# Patient Record
Sex: Female | Born: 1975
Health system: Southern US, Community
[De-identification: ages and names within clinical notes are randomized; demographics above are authoritative.]

## PROBLEM LIST (undated history)

## (undated) DIAGNOSIS — N83209 Unspecified ovarian cyst, unspecified side: Secondary | ICD-10-CM

## (undated) DIAGNOSIS — D649 Anemia, unspecified: Secondary | ICD-10-CM

## (undated) DIAGNOSIS — L853 Xerosis cutis: Secondary | ICD-10-CM

## (undated) DIAGNOSIS — J302 Other seasonal allergic rhinitis: Secondary | ICD-10-CM

## (undated) DIAGNOSIS — R5383 Other fatigue: Secondary | ICD-10-CM

## (undated) DIAGNOSIS — E559 Vitamin D deficiency, unspecified: Secondary | ICD-10-CM

## (undated) DIAGNOSIS — M7989 Other specified soft tissue disorders: Secondary | ICD-10-CM

## (undated) DIAGNOSIS — G479 Sleep disorder, unspecified: Secondary | ICD-10-CM

## (undated) HISTORY — DX: Sleep disorder, unspecified: G47.9

## (undated) HISTORY — DX: Other seasonal allergic rhinitis: J30.2

## (undated) HISTORY — DX: Unspecified ovarian cyst, unspecified side: N83.209

## (undated) HISTORY — DX: Vitamin D deficiency, unspecified: E55.9

## (undated) HISTORY — DX: Other fatigue: R53.83

## (undated) HISTORY — DX: Xerosis cutis: L85.3

## (undated) HISTORY — DX: Other specified soft tissue disorders: M79.89

---

## 1999-09-19 ENCOUNTER — Other Ambulatory Visit: Admission: RE | Admit: 1999-09-19 | Discharge: 1999-09-19 | Payer: Self-pay | Admitting: Gynecology

## 2000-08-12 ENCOUNTER — Inpatient Hospital Stay (HOSPITAL_COMMUNITY): Admission: AD | Admit: 2000-08-12 | Discharge: 2000-08-14 | Payer: Self-pay | Admitting: *Deleted

## 2000-09-23 ENCOUNTER — Other Ambulatory Visit: Admission: RE | Admit: 2000-09-23 | Discharge: 2000-09-23 | Payer: Self-pay | Admitting: Gynecology

## 2001-10-07 ENCOUNTER — Other Ambulatory Visit: Admission: RE | Admit: 2001-10-07 | Discharge: 2001-10-07 | Payer: Self-pay | Admitting: Gynecology

## 2003-01-20 ENCOUNTER — Other Ambulatory Visit: Admission: RE | Admit: 2003-01-20 | Discharge: 2003-01-20 | Payer: Self-pay | Admitting: Obstetrics & Gynecology

## 2003-04-07 ENCOUNTER — Encounter: Admission: RE | Admit: 2003-04-07 | Discharge: 2003-05-18 | Payer: Self-pay | Admitting: Orthopedic Surgery

## 2004-05-16 ENCOUNTER — Other Ambulatory Visit: Admission: RE | Admit: 2004-05-16 | Discharge: 2004-05-16 | Payer: Self-pay | Admitting: Obstetrics & Gynecology

## 2004-11-18 ENCOUNTER — Inpatient Hospital Stay (HOSPITAL_COMMUNITY): Admission: AD | Admit: 2004-11-18 | Discharge: 2004-11-21 | Payer: Self-pay | Admitting: Obstetrics and Gynecology

## 2004-12-30 ENCOUNTER — Other Ambulatory Visit: Admission: RE | Admit: 2004-12-30 | Discharge: 2004-12-30 | Payer: Self-pay | Admitting: Obstetrics & Gynecology

## 2005-09-16 ENCOUNTER — Ambulatory Visit (HOSPITAL_COMMUNITY): Admission: RE | Admit: 2005-09-16 | Discharge: 2005-09-16 | Payer: Self-pay | Admitting: Family Medicine

## 2005-09-18 ENCOUNTER — Ambulatory Visit (HOSPITAL_COMMUNITY): Admission: RE | Admit: 2005-09-18 | Discharge: 2005-09-18 | Payer: Self-pay | Admitting: Family Medicine

## 2006-06-10 ENCOUNTER — Encounter: Admission: RE | Admit: 2006-06-10 | Discharge: 2006-06-10 | Payer: Self-pay | Admitting: Obstetrics and Gynecology

## 2006-07-10 ENCOUNTER — Emergency Department (HOSPITAL_COMMUNITY): Admission: EM | Admit: 2006-07-10 | Discharge: 2006-07-10 | Payer: Self-pay | Admitting: Family Medicine

## 2006-09-07 ENCOUNTER — Ambulatory Visit: Payer: Self-pay | Admitting: Oncology

## 2006-10-11 ENCOUNTER — Emergency Department (HOSPITAL_COMMUNITY): Admission: EM | Admit: 2006-10-11 | Discharge: 2006-10-11 | Payer: Self-pay | Admitting: Emergency Medicine

## 2007-08-27 ENCOUNTER — Ambulatory Visit (HOSPITAL_COMMUNITY): Admission: RE | Admit: 2007-08-27 | Discharge: 2007-08-27 | Payer: Self-pay | Admitting: Neurology

## 2007-09-24 ENCOUNTER — Encounter: Admission: RE | Admit: 2007-09-24 | Discharge: 2007-09-24 | Payer: Self-pay | Admitting: Obstetrics and Gynecology

## 2008-05-11 ENCOUNTER — Ambulatory Visit: Payer: Self-pay | Admitting: Family Medicine

## 2008-05-11 DIAGNOSIS — N390 Urinary tract infection, site not specified: Secondary | ICD-10-CM | POA: Insufficient documentation

## 2008-05-11 DIAGNOSIS — E162 Hypoglycemia, unspecified: Secondary | ICD-10-CM | POA: Insufficient documentation

## 2008-05-11 DIAGNOSIS — Z6841 Body Mass Index (BMI) 40.0 and over, adult: Secondary | ICD-10-CM

## 2008-05-12 ENCOUNTER — Ambulatory Visit: Payer: Self-pay | Admitting: Family Medicine

## 2008-05-16 LAB — CONVERTED CEMR LAB
ALT: 14 units/L (ref 0–35)
AST: 19 units/L (ref 0–37)
Alkaline Phosphatase: 48 units/L (ref 39–117)
BUN: 12 mg/dL (ref 6–23)
Bilirubin, Direct: 0.1 mg/dL (ref 0.0–0.3)
Creatinine, Ser: 0.7 mg/dL (ref 0.4–1.2)
GFR calc Af Amer: 123 mL/min
Glucose, Bld: 76 mg/dL (ref 70–99)
Hgb A1c MFr Bld: 5.3 % (ref 4.6–6.0)
Total Protein: 6.3 g/dL (ref 6.0–8.3)

## 2008-07-11 ENCOUNTER — Ambulatory Visit: Payer: Self-pay | Admitting: Genetic Counselor

## 2008-08-04 ENCOUNTER — Emergency Department (HOSPITAL_COMMUNITY): Admission: EM | Admit: 2008-08-04 | Discharge: 2008-08-04 | Payer: Self-pay | Admitting: Family Medicine

## 2008-09-06 ENCOUNTER — Ambulatory Visit: Payer: Self-pay | Admitting: Family Medicine

## 2008-09-06 DIAGNOSIS — J069 Acute upper respiratory infection, unspecified: Secondary | ICD-10-CM | POA: Insufficient documentation

## 2009-04-17 ENCOUNTER — Ambulatory Visit: Payer: Self-pay | Admitting: Family Medicine

## 2009-04-17 DIAGNOSIS — J018 Other acute sinusitis: Secondary | ICD-10-CM | POA: Insufficient documentation

## 2009-11-12 ENCOUNTER — Emergency Department (HOSPITAL_COMMUNITY): Admission: EM | Admit: 2009-11-12 | Discharge: 2009-11-12 | Payer: Self-pay | Admitting: Emergency Medicine

## 2010-08-02 ENCOUNTER — Ambulatory Visit (INDEPENDENT_AMBULATORY_CARE_PROVIDER_SITE_OTHER): Payer: Commercial Managed Care - PPO | Admitting: Family Medicine

## 2010-08-02 ENCOUNTER — Encounter: Payer: Self-pay | Admitting: Family Medicine

## 2010-08-02 DIAGNOSIS — J018 Other acute sinusitis: Secondary | ICD-10-CM

## 2010-08-05 ENCOUNTER — Ambulatory Visit: Payer: Self-pay | Admitting: Family Medicine

## 2010-08-06 NOTE — Assessment & Plan Note (Signed)
Summary: ?SINUS INFECTION/CLE   [facility]   Vital Signs:  Patient profile:   35 year old female Height:      66.5 inches Weight:      200.25 pounds BMI:     31.95 Temp:     97.8 degrees F oral Pulse rate:   65 / minute Pulse rhythm:   regular BP sitting:   100 / 80  (left arm) Cuff size:   regular  Vitals Entered By: Linde Gillis CMA Duncan Dull) (August 02, 2010 9:18 AM) CC: sinus infection   History of Present Illness: 35 yo with 3 weeks of sinus pressure.  Had been taking guafenisin and Alleve and symptoms were improving but over the weekend sinus pressure worsened with subjective fevers.  No cough.  No sore throat, just scratchy.  No ear pain.    Her son has strep throat.   Current Medications (verified): 1)  Mucus Relief 400 Mg Tabs (Guaifenesin) .... As Needed 2)  Multivitamins   Tabs (Multiple Vitamin) .... Take 1 Tablet By Mouth Once A Day 3)  Vitamin C 500 Mg  Tabs (Ascorbic Acid) .... Take 1 Tablet By Mouth Once A Day 4)  Amoxicillin 875 Mg Tabs (Amoxicillin) .Marland Kitchen.. 1 Tab By Mouth X 10 Days  Allergies: 1)  ! Sulfa  Past History:  Past Surgical History: Last updated: 05/11/2008 IVP: nml age 31  Family History: Last updated: 05/11/2008 father: healthy mother: CVA, post chemo, breast cancer age 32 Family History Breast cancer 1st degree relative <50 brother: healthy MGM: afib  Social History: Last updated: 05/11/2008 Occupation: Charity fundraiser, Armed forces technical officer, at [employer] Married 2 children: healthy Never Smoked Alcohol use-no Drug use-no Regular exercise-yes, 3 times a week, step class Diet: fruits andv eggies, water  Risk Factors: Exercise: yes (05/11/2008)  Risk Factors: Smoking Status: never (05/11/2008)  Review of Systems      See HPI General:  Complains of chills and fever. ENT:  Complains of postnasal drainage, sinus pressure, and sore throat. CV:  Denies chest pain or discomfort. Resp:  Complains of cough.  Physical Exam  General:   Well-developed,well-nourished,in no acute distress; alert,appropriate and cooperative throughout examination Nose:  Mild inflamm with mild Tan mucous. Mouth:  Oral mucosa and oropharynx without lesions or exudates.  Teeth in good repair. Lungs:  Normal respiratory effort, chest expands symmetrically. Lungs are clear to auscultation, no crackles or wheezes. Heart:  Normal rate and regular rhythm. S1 and S2 normal without gallop, murmur, click, rub or other extra sounds. Psych:  Cognition and judgment appear intact. Alert and cooperative with normal attention span and concentration. No apparent delusions, illusions, hallucinations   Impression & Recommendations:  Problem # 1:  OTHER ACUTE SINUSITIS (ICD-461.8) Assessment New given duration and progression of symptoms, will treat for bacterial sinusitis with Amoxicillin 875 mg two times a day x 10 days. Advised adding flonase. Her updated medication list for this problem includes:    Mucus Relief 400 Mg Tabs (Guaifenesin) .Marland Kitchen... As needed    Amoxicillin 875 Mg Tabs (Amoxicillin) .Marland Kitchen... 1 tab by mouth x 10 days    Fluticasone Propionate 50 Mcg/act Susp (Fluticasone propionate) .Marland Kitchen... 2 sprays each nostril once daily  Complete Medication List: 1)  Mucus Relief 400 Mg Tabs (Guaifenesin) .... As needed 2)  Multivitamins Tabs (Multiple vitamin) .... Take 1 tablet by mouth once a day 3)  Vitamin C 500 Mg Tabs (Ascorbic acid) .... Take 1 tablet by mouth once a day 4)  Amoxicillin 875 Mg Tabs (Amoxicillin) .Marland KitchenMarland KitchenMarland Kitchen  1 tab by mouth x 10 days 5)  Fluticasone Propionate 50 Mcg/act Susp (Fluticasone propionate) .... 2 sprays each nostril once daily Prescriptions: FLUTICASONE PROPIONATE 50 MCG/ACT  SUSP (FLUTICASONE PROPIONATE) 2 sprays each nostril once daily  #1 vial x 3   Entered and Authorized by:   Ruthe Mannan MD   Signed by:   Ruthe Mannan MD on 08/02/2010   Method used:   Electronically to        [id number] (retail)       [address]       [place], [place]  [address]       Ph: [phone]       Fax: [phone]   RxID:   [id number] AMOXICILLIN 875 MG TABS (AMOXICILLIN) 1 tab by mouth x 10 days  #20 x 0   Entered and Authorized by:   Ruthe Mannan MD   Signed by:   Ruthe Mannan MD on 08/02/2010   Method used:   Electronically to        [id number] (retail)       [address]       [place], [place]  [address]       Ph: [phone]       Fax: [phone]   RxID:   [id number]    Orders Added: 1)  Est. Patient Level III [30160]    Current Allergies (reviewed today): ! SULFA

## 2010-10-25 NOTE — Discharge Summary (Signed)
[facility]  Patient:    Wanda Peters, Wanda Peters                 MRN: [id number] Adm. Date:  [id number] Disc. Date: [id number] Attending:  Wetzel Bjornstad Dictator:   Antony Contras, N.P.                           Discharge Summary  DISCHARGE DIAGNOSES:          Intrauterine pregnancy at 35 weeks and 6 days with prolonged premature rupture of membranes.  PROCEDURE:                    Normal spontaneous vaginal delivery of a viable infant over intact perineum, perineal laceration.  HISTORY OF PRESENT ILLNESS:   Patient is a 35 year old prima gravida with LMP December 04, 1999, [facility] September 10, 2000.  Prenatal course has been uncomplicated.  PRENATAL LABORATORIES:        Blood type O+.  Antibody screen negative.  RPR, HBSAG, HIV nonreactive.  Toxo negative.  Rubella equivocal.  MSAFP normal.  HOSPITAL COURSE:              Patient presented at 35 weeks and 6 days on August 12, 2000 with spontaneous rupture of membranes.  Cervix on admission was 1-2, 80%, -2 station.  Amniotic fluid was sent for ______.  Patient was also given the option to wait labor versus active induction and decided to wait if the ______ was negative.  Nevertheless, she began having increased uterine contractions and began to progress in labor.  She did progress to complete dilatation and was delivered of an Apgar 9/9 female infant weighing 7 pounds 4 ounces over intact perineum sustaining a perineal laceration.  Postpartum she had no difficulty voiding and she remained afebrile.  LABORATORIES:                 CBC:  Hematocrit 29.5, hemoglobin 10.8, WBC 12.6, platelets 231,000.  DISPOSITION:                  She was able to be discharged on her second postpartum day in satisfactory condition.  Equivocal rubella status discussed with patient and she wished to have this rechecked at the postpartum visit. Continue with prenatal vitamins, iron, Tylox for pain.DD:  08/31/00 TD:  09/01/00 Job:  2130 [id number]

## 2011-03-14 ENCOUNTER — Inpatient Hospital Stay (INDEPENDENT_AMBULATORY_CARE_PROVIDER_SITE_OTHER)
Admission: RE | Admit: 2011-03-14 | Discharge: 2011-03-14 | Disposition: A | Discharge status: Home / Self Care | Payer: 59 | Source: Ambulatory Visit | Attending: Emergency Medicine | Admitting: Emergency Medicine

## 2011-03-14 DIAGNOSIS — J019 Acute sinusitis, unspecified: Secondary | ICD-10-CM

## 2013-02-28 ENCOUNTER — Encounter (HOSPITAL_COMMUNITY): Payer: Self-pay

## 2013-02-28 ENCOUNTER — Emergency Department (HOSPITAL_COMMUNITY)
Admission: EM | Admit: 2013-02-28 | Discharge: 2013-02-28 | Disposition: A | Discharge status: Home / Self Care | Payer: 59 | Source: Home / Self Care

## 2013-02-28 DIAGNOSIS — J329 Chronic sinusitis, unspecified: Secondary | ICD-10-CM

## 2013-02-28 MED ORDER — TOBRAMYCIN 0.3 % OP SOLN: 1.0000 [drp] | OPHTHALMIC | Status: DC

## 2013-02-28 MED ORDER — AMOXICILLIN-POT CLAVULANATE 875-125 MG PO TABS: 1.0000 | ORAL_TABLET | Freq: Two times a day (BID) | ORAL | Status: DC

## 2013-02-28 MED ORDER — FLUCONAZOLE 150 MG PO TABS: 150.0000 mg | ORAL_TABLET | Freq: Once | ORAL | Status: DC

## 2013-02-28 MED ORDER — HYDROCOD POLST-CHLORPHEN POLST 10-8 MG/5ML PO LQCR: 5.0000 mL | Freq: Two times a day (BID) | ORAL | Status: DC

## 2013-02-28 NOTE — ED Notes (Signed)
URI type syx, 3 day duration of cough, hoarseness; NAD at present

## 2013-02-28 NOTE — ED Provider Notes (Signed)
CSN: [id number]     Arrival date & time 02/28/13  0815 History   None    No chief complaint on file.  (Consider location/radiation/quality/duration/timing/severity/associated sxs/prior Treatment) Patient is a 37 y.o. female presenting with URI. No language interpreter was used.  URI Presenting symptoms: congestion and cough   Cough:    Cough characteristics:  Non-productive   Severity:  Moderate   Timing:  Constant   Chronicity:  New Severity:  Mild Timing:  Constant Chronicity:  New Ineffective treatments:  None tried Pt complains of cough and congestion and sinus pressure  No past medical history on file. No past surgical history on file. No family history on file. History  Substance Use Topics  . Smoking status: Not on file  . Smokeless tobacco: Not on file  . Alcohol Use: Not on file   OB History   No data available     Review of Systems  HENT: Positive for congestion.   Respiratory: Positive for cough.   All other systems reviewed and are negative.    Allergies  Sulfonamide derivatives  Home Medications  No current outpatient prescriptions on file. BP 115/82  Pulse 78  Temp(Src) 98.3 F (36.8 C) (Oral)  Resp 18  SpO2 98% Physical Exam  Nursing note and vitals reviewed. Constitutional: She appears well-developed.  HENT:  Head: Normocephalic.  Right Ear: External ear normal.  Mouth/Throat: Oropharynx is clear and moist.  Eyes: Conjunctivae are normal. Pupils are equal, round, and reactive to light.  Neck: Normal range of motion.  Cardiovascular: Normal rate.   Pulmonary/Chest: Effort normal.  Abdominal: Soft.  Musculoskeletal: Normal range of motion.  Neurological: She is alert.  Skin: Skin is warm.    ED Course  Procedures (including critical care time) Labs Review Labs Reviewed - No data to display Imaging Review No results found.  MDM   1. Sinusitis    Augmentine, tussionex and diflucan    Elson Areas, [place] 02/28/13 [id number]

## 2013-03-01 NOTE — ED Provider Notes (Signed)
Medical screening examination/treatment/procedure(s) were performed by a resident physician or non-physician practitioner and as the supervising physician I was immediately available for consultation/collaboration.  Ryleeann Urquiza, MD    Kooper Godshall S Lyrah Bradt, MD 03/01/13 0811 

## 2013-05-04 ENCOUNTER — Other Ambulatory Visit: Payer: Self-pay | Admitting: Obstetrics and Gynecology

## 2013-05-04 DIAGNOSIS — Z1231 Encounter for screening mammogram for malignant neoplasm of breast: Secondary | ICD-10-CM

## 2013-05-04 DIAGNOSIS — Z803 Family history of malignant neoplasm of breast: Secondary | ICD-10-CM

## 2013-06-10 ENCOUNTER — Encounter: Payer: 59 | Admitting: Family Medicine

## 2013-06-14 ENCOUNTER — Encounter: Payer: Self-pay | Admitting: Family Medicine

## 2013-06-14 ENCOUNTER — Ambulatory Visit: Payer: 59

## 2013-06-14 ENCOUNTER — Ambulatory Visit (INDEPENDENT_AMBULATORY_CARE_PROVIDER_SITE_OTHER): Payer: 59 | Admitting: Family Medicine

## 2013-06-14 VITALS — BP 104/70 | HR 68 | Temp 98.3°F | Ht 66.5 in | Wt 205.0 lb

## 2013-06-14 DIAGNOSIS — Z0000 Encounter for general adult medical examination without abnormal findings: Secondary | ICD-10-CM

## 2013-06-14 NOTE — Progress Notes (Signed)
Subjective:    Patient ID: Wanda Peters, female    DOB: 1976/04/01, 38 y.o.   MRN: [id number]  HPI  38 year old female presents for annual check.  She has CPX at GYN Dr. Julien Girt( nml Pap /DVE) 1 months ago.  Mammogram 3D scheduled in  Few weeks, plan breast MRI given mothers history.  She has been going to bariatric clinic in last 6 months. Had  lipids, TSH, BMET : nml in 12/2012.  She has been using Fastin 30 mg daily..  She has lost 36 lb weight loss since July. Wt Readings from Last 3 Encounters:  06/14/13 205 lb (92.987 kg)  08/02/10 200 lb 4 oz (90.833 kg)  04/17/09 197 lb (89.359 kg)   Exercise:  3 days a week 20-30 min. Diet: Low carb diet, water, high protein.  Allergic rhinitis: stable on flonase.  She is being treated for sinus infection with augmentin   Review of Systems  Constitutional: Negative for fever, fatigue and unexpected weight change.  HENT: Negative for congestion, ear pain, sinus pressure, sneezing, sore throat and trouble swallowing.   Eyes: Negative for pain and itching.  Respiratory: Negative for cough, shortness of breath and wheezing.   Cardiovascular: Negative for chest pain, palpitations and leg swelling.  Gastrointestinal: Positive for constipation. Negative for nausea, abdominal pain, diarrhea and blood in stool.       Only if not drinking fluids  Genitourinary: Negative for dysuria, hematuria, vaginal discharge, difficulty urinating and menstrual problem.  Skin: Negative for rash.  Neurological: Negative for syncope, weakness, light-headedness, numbness and headaches.  Psychiatric/Behavioral: Negative for confusion and dysphoric mood. The patient is not nervous/anxious.        Objective:   Physical Exam  Constitutional: Vital signs are normal. She appears well-developed and well-nourished. She is cooperative.  Non-toxic appearance. She does not appear ill. No distress.  HENT:  Head: Normocephalic.  Right Ear: Hearing, tympanic  membrane, external ear and ear canal normal.  Left Ear: Hearing, tympanic membrane, external ear and ear canal normal.  Nose: Nose normal.  Eyes: Conjunctivae, EOM and lids are normal. Pupils are equal, round, and reactive to light. Lids are everted and swept, no foreign bodies found.  Neck: Trachea normal and normal range of motion. Neck supple. Carotid bruit is not present. No mass and no thyromegaly present.  Cardiovascular: Normal rate, regular rhythm, S1 normal, S2 normal, normal heart sounds and intact distal pulses.  Exam reveals no gallop.   No murmur heard. Pulmonary/Chest: Effort normal and breath sounds normal. No respiratory distress. She has no wheezes. She has no rhonchi. She has no rales.  Abdominal: Soft. Normal appearance and bowel sounds are normal. She exhibits no distension, no fluid wave, no abdominal bruit and no mass. There is no hepatosplenomegaly. There is no tenderness. There is no rebound, no guarding and no CVA tenderness. No hernia.  Lymphadenopathy:    She has no cervical adenopathy.    She has no axillary adenopathy.  Neurological: She is alert. She has normal strength. No cranial nerve deficit or sensory deficit.  Skin: Skin is warm, dry and intact. No rash noted.  Psychiatric: Her speech is normal and behavior is normal. Judgment normal. Her mood appears not anxious. Cognition and memory are normal. She does not exhibit a depressed mood.          Assessment & Plan:  The patient's preventative maintenance and recommended screening tests for an annual wellness exam were reviewed in full today.  Brought up to date unless services declined.  Counselled on the importance of diet, exercise, and its role in overall health and mortality. The patient's FH and SH was reviewed, including their home life, tobacco status, and drug and alcohol status.   No family history of colon cancer Vaccine: /. TDap, uptodate with flu

## 2013-06-14 NOTE — Patient Instructions (Addendum)
Look into Tdap vaccination status.  Work on [employer], and regular exercise.

## 2013-06-14 NOTE — Progress Notes (Signed)
Pre-visit discussion using our clinic review tool. No additional management support is needed unless otherwise documented below in the visit note.  

## 2013-06-30 ENCOUNTER — Ambulatory Visit
Admission: RE | Admit: 2013-06-30 | Discharge: 2013-06-30 | Disposition: A | Discharge status: Home / Self Care | Payer: 59 | Source: Ambulatory Visit | Attending: Obstetrics and Gynecology | Admitting: Obstetrics and Gynecology

## 2013-06-30 DIAGNOSIS — Z1231 Encounter for screening mammogram for malignant neoplasm of breast: Secondary | ICD-10-CM

## 2013-06-30 DIAGNOSIS — Z803 Family history of malignant neoplasm of breast: Secondary | ICD-10-CM

## 2014-07-14 LAB — HM PAP SMEAR: HM PAP: NEGATIVE

## 2014-09-12 ENCOUNTER — Telehealth: Payer: Self-pay | Admitting: Nurse Practitioner

## 2014-09-12 DIAGNOSIS — J0100 Acute maxillary sinusitis, unspecified: Secondary | ICD-10-CM

## 2014-09-12 MED ORDER — AMOXICILLIN 875 MG PO TABS: 875.0000 mg | ORAL_TABLET | Freq: Two times a day (BID) | ORAL | Status: DC

## 2014-09-12 MED ORDER — AZITHROMYCIN 250 MG PO TABS: ORAL_TABLET | ORAL | Status: DC

## 2014-09-12 NOTE — Progress Notes (Signed)

## 2014-09-12 NOTE — Addendum Note (Signed)
Addended by: Chevis Pretty on: 09/12/2014 05:32 PM   Modules accepted: Orders

## 2014-11-18 ENCOUNTER — Telehealth: Payer: Self-pay | Admitting: Family

## 2014-11-18 DIAGNOSIS — J019 Acute sinusitis, unspecified: Secondary | ICD-10-CM

## 2014-11-18 MED ORDER — AMOXICILLIN-POT CLAVULANATE 875-125 MG PO TABS: 1.0000 | ORAL_TABLET | Freq: Two times a day (BID) | ORAL | Status: DC

## 2014-11-18 NOTE — Progress Notes (Signed)

## 2014-11-21 ENCOUNTER — Ambulatory Visit (INDEPENDENT_AMBULATORY_CARE_PROVIDER_SITE_OTHER): Payer: 59 | Admitting: Family Medicine

## 2014-11-21 ENCOUNTER — Encounter: Payer: Self-pay | Admitting: Family Medicine

## 2014-11-21 VITALS — BP 110/70 | HR 92 | Temp 98.5°F | Ht 66.5 in | Wt 259.0 lb

## 2014-11-21 DIAGNOSIS — J0100 Acute maxillary sinusitis, unspecified: Secondary | ICD-10-CM

## 2014-11-21 DIAGNOSIS — R0600 Dyspnea, unspecified: Secondary | ICD-10-CM

## 2014-11-21 MED ORDER — MOXIFLOXACIN HCL 400 MG PO TABS: 400.0000 mg | ORAL_TABLET | Freq: Every day | ORAL | Status: DC

## 2014-11-21 MED ORDER — HYDROCODONE-HOMATROPINE 5-1.5 MG/5ML PO SYRP: 5.0000 mL | ORAL_SOLUTION | Freq: Every evening | ORAL | Status: DC | PRN

## 2014-11-21 NOTE — Patient Instructions (Signed)
Take the cough suppressant at bedtime as needed Finish the course of Augmentin Call or go to ER for severe shortness of breath

## 2014-11-21 NOTE — Progress Notes (Signed)
Subjective:     Patient ID: [name], female   DOB: 07/21/1975, 39 y.o.   MRN: [id number]  HPI Ms. Hodges is a 39 y/o F presenting with fever and sinus sx. Started with a fever of 102.7 on Friday, had sinus/allergy symptoms for a week and a half before Friday. Did an e-visit on Saturday, has been taking Augmentin since then. Sinus sx have improved since starting Augmentin, but now is having difficulty breathing. Has tolerated Augmentin before.  Still having fever, was 100.9 this am. Is coughing, dry cough.  Now is having chest tightness, feels like she can't catch her breath. This started yesterday. SOB is worse with exertion. Improves with sitting down and resting. Hasn't had anything like this before, hx of childhood asthma. Feels like it is worse when her fever is up.   Feels like her legs are swollen, gets worse when fever is up. No hx of blood clot, not taking oral contraceptives.  Currently taking Augmentin, mucinex, allegra, flonase, tylenol, and motrin for sx.    Review of Systems  Constitutional: Positive for fever and fatigue.  HENT: Negative for sinus pressure.   Respiratory: Positive for cough, shortness of breath and wheezing.   Cardiovascular: Negative for chest pain and palpitations.  Neurological: Positive for headaches.       Objective:   Physical Exam  Constitutional: She is oriented to person, place, and time. She appears well-developed and well-nourished.  HENT:  Head: Normocephalic and atraumatic.  Right Ear: Tympanic membrane normal.  Left Ear: Tympanic membrane normal.  Nose: Nose normal. No mucosal edema. Right sinus exhibits no maxillary sinus tenderness. Left sinus exhibits no maxillary sinus tenderness.  Cardiovascular: Normal rate, regular rhythm and normal heart sounds.   No murmur heard. Pulmonary/Chest: Effort normal and breath sounds normal. No respiratory distress. She has no wheezes.  Musculoskeletal:  Negative Homan sign   Neurological: She is alert and oriented to person, place, and time.  Psychiatric: She has a normal mood and affect. Her behavior is normal.       Assessment:     1. Sinusitis: Patient feels sx are improving since starting Augmentin  2. Dyspnea, mild : Lungs clear to auscultation bilaterally. Consider asthma flare or pneumonia, but less likely. No red flags for DVT/PE.    Plan:    1. Sinusitis: Complete course of Augmentin  2. Dyspnea, mild: Peak flow was 350. Begin cough suppressant at bedtime as needed. Plan to give sx more time, advised pt to call or go to ER for severe shortness of breath.      Dante Gang served as Education administrator for Dr. Diona Browner 11/21/14 9:30 am  Patient seen and examined. Med student acted as Education administrator only.  HPI/ROS/PE and assessment/plan created  By MD and scribed by med student.  Eliezer Lofts MD

## 2014-11-21 NOTE — Progress Notes (Signed)
Pre visit review using our clinic review tool, if applicable. No additional management support is needed unless otherwise documented below in the visit note. 

## 2014-11-23 ENCOUNTER — Encounter: Payer: Self-pay | Admitting: Family Medicine

## 2014-12-01 ENCOUNTER — Ambulatory Visit (INDEPENDENT_AMBULATORY_CARE_PROVIDER_SITE_OTHER)
Admission: RE | Admit: 2014-12-01 | Discharge: 2014-12-01 | Disposition: A | Discharge status: Home / Self Care | Payer: 59 | Source: Ambulatory Visit | Attending: Family Medicine | Admitting: Family Medicine

## 2014-12-01 ENCOUNTER — Encounter: Payer: Self-pay | Admitting: Family Medicine

## 2014-12-01 ENCOUNTER — Ambulatory Visit (INDEPENDENT_AMBULATORY_CARE_PROVIDER_SITE_OTHER): Payer: 59 | Admitting: Family Medicine

## 2014-12-01 VITALS — BP 112/68 | HR 73 | Temp 97.9°F | Wt 252.5 lb

## 2014-12-01 DIAGNOSIS — R0600 Dyspnea, unspecified: Secondary | ICD-10-CM | POA: Diagnosis not present

## 2014-12-01 DIAGNOSIS — R5383 Other fatigue: Secondary | ICD-10-CM

## 2014-12-01 LAB — COMPREHENSIVE METABOLIC PANEL
ALK PHOS: 136 U/L — AB (ref 39–117)
ALT: 29 U/L (ref 0–35)
AST: 26 U/L (ref 0–37)
Albumin: 3.6 g/dL (ref 3.5–5.2)
BILIRUBIN TOTAL: 0.5 mg/dL (ref 0.2–1.2)
BUN: 14 mg/dL (ref 6–23)
CO2: 23 meq/L (ref 19–32)
Calcium: 8.6 mg/dL (ref 8.4–10.5)
Chloride: 104 mEq/L (ref 96–112)
Creat: 0.63 mg/dL (ref 0.50–1.10)
GLUCOSE: 87 mg/dL (ref 70–99)
Potassium: 4.3 mEq/L (ref 3.5–5.3)
SODIUM: 138 meq/L (ref 135–145)
TOTAL PROTEIN: 6.8 g/dL (ref 6.0–8.3)

## 2014-12-01 LAB — CBC WITH DIFFERENTIAL/PLATELET
BASOS ABS: 0 10*3/uL (ref 0.0–0.1)
BASOS PCT: 0 % (ref 0–1)
Eosinophils Absolute: 0.1 10*3/uL (ref 0.0–0.7)
Eosinophils Relative: 2 % (ref 0–5)
HEMATOCRIT: 37.6 % (ref 36.0–46.0)
Hemoglobin: 12.9 g/dL (ref 12.0–15.0)
Lymphocytes Relative: 35 % (ref 12–46)
Lymphs Abs: 1.9 10*3/uL (ref 0.7–4.0)
MCH: 31.2 pg (ref 26.0–34.0)
MCHC: 34.3 g/dL (ref 30.0–36.0)
MCV: 90.8 fL (ref 78.0–100.0)
MONOS PCT: 19 % — AB (ref 3–12)
MPV: 9 fL (ref 8.6–12.4)
Monocytes Absolute: 1 10*3/uL (ref 0.1–1.0)
NEUTROS ABS: 2.3 10*3/uL (ref 1.7–7.7)
NEUTROS PCT: 44 % (ref 43–77)
Platelets: 576 10*3/uL — ABNORMAL HIGH (ref 150–400)
RBC: 4.14 MIL/uL (ref 3.87–5.11)
RDW: 13.6 % (ref 11.5–15.5)
WBC: 5.3 10*3/uL (ref 4.0–10.5)

## 2014-12-01 LAB — TSH: TSH: 1.688 u[IU]/mL (ref 0.350–4.500)

## 2014-12-01 LAB — D-DIMER, QUANTITATIVE: D-Dimer, Quant: 0.86 ug/mL-FEU — ABNORMAL HIGH (ref 0.00–0.48)

## 2014-12-01 LAB — POCT URINE PREGNANCY: Preg Test, Ur: NEGATIVE

## 2014-12-01 NOTE — Patient Instructions (Signed)
We will call with labs results.  Go to ER for severe shortness of breath.

## 2014-12-01 NOTE — Assessment & Plan Note (Signed)
Nml CXR. Will eval further with labs to consider anemia and thyroid issue. Less liekly PE but will check D-dimer.

## 2014-12-01 NOTE — Progress Notes (Signed)
   Subjective:    Patient ID: Wanda Peters, female    DOB: May 28, 1976, 39 y.o.   MRN: [id number]  HPI 39 year old female pt with recent acute sinus infection seen last on 11/21/2014  s/p course of augmentin, ineffective. Given dyspnea.. Changed to avelox 400 mg daily x 7 days to cover for bronchitis/ lower respiratory tract infeciton.  Completed antibiotics 2 days ago.  Since then she reports sinus pressure, cough ( still mild cough, productive white phlegm) and congestion improved but still feeling winded with walking short distances. Very fatigued.  No fever in last week.  After shower or ironing clothes feels weak and dizzy, has to sit on bed.  Heaviness in mid back with taking a deep breath.  No chest pain.  Social History /Family History/Past Medical History reviewed and updated if needed.   Review of Systems  Constitutional: Negative for fever and fatigue.  HENT: Negative for ear pain.   Eyes: Negative for pain.  Respiratory: Positive for shortness of breath. Negative for chest tightness.   Cardiovascular: Negative for chest pain, palpitations and leg swelling.  Gastrointestinal: Negative for abdominal pain.  Genitourinary: Negative for dysuria.       Objective:   Physical Exam  Constitutional: Vital signs are normal. She appears well-developed and well-nourished. She is cooperative.  Non-toxic appearance. She does not appear ill. No distress.  fatigued appearing  obese female in NAD  HENT:  Head: Normocephalic.  Right Ear: Hearing, tympanic membrane, external ear and ear canal normal. Tympanic membrane is not erythematous, not retracted and not bulging.  Left Ear: Hearing, tympanic membrane, external ear and ear canal normal. Tympanic membrane is not erythematous, not retracted and not bulging.  Nose: No mucosal edema or rhinorrhea. Right sinus exhibits no maxillary sinus tenderness and no frontal sinus tenderness. Left sinus exhibits no maxillary sinus tenderness  and no frontal sinus tenderness.  Mouth/Throat: Uvula is midline, oropharynx is clear and moist and mucous membranes are normal.  Eyes: Conjunctivae, EOM and lids are normal. Pupils are equal, round, and reactive to light. Lids are everted and swept, no foreign bodies found.  Neck: Trachea normal and normal range of motion. Neck supple. Carotid bruit is not present. No thyroid mass and no thyromegaly present.  Cardiovascular: Normal rate, regular rhythm, S1 normal, S2 normal, normal heart sounds, intact distal pulses and normal pulses.  Exam reveals no gallop and no friction rub.   No murmur heard. Pulmonary/Chest: Effort normal and breath sounds normal. No tachypnea. No respiratory distress. She has no decreased breath sounds. She has no wheezes. She has no rhonchi. She has no rales.  Abdominal: Soft. Normal appearance and bowel sounds are normal. There is no tenderness.  Neurological: She is alert.  Skin: Skin is warm, dry and intact. No rash noted.  Psychiatric: Her speech is normal and behavior is normal. Judgment and thought content normal. Her mood appears not anxious. Cognition and memory are normal. She does not exhibit a depressed mood.          Assessment & Plan:

## 2014-12-01 NOTE — Progress Notes (Signed)
Pre visit review using our clinic review tool, if applicable. No additional management support is needed unless otherwise documented below in the visit note. 

## 2014-12-04 ENCOUNTER — Telehealth: Payer: Self-pay

## 2014-12-04 NOTE — Telephone Encounter (Signed)
PLEASE NOTE: All timestamps contained within this report are represented as [place] Standard Time. CONFIDENTIALTY NOTICE: This fax transmission is intended only for the addressee. It contains information that is legally privileged, confidential or otherwise protected from use or disclosure. If you are not the intended recipient, you are strictly prohibited from reviewing, disclosing, copying using or disseminating any of this information or taking any action in reliance on or regarding this information. If you have received this fax in error, please notify us immediately by telephone so that we can arrange for its return to [name]. Phone: [phone], Toll-Free: [phone], Fax: [phone] Page: 1 of 1 Call Id: [id number] [place] Patient Name: Wanda Peters AN Gender: Female DOB: 1975/07/10 Age: 39 Y 11 M 22 D Return Phone Number: Address: City/State/Zip: [place] Client [place] Night - Client Client Site [place] Physician Deming, Amy Contact Type Call Call Type Labs Is this call to report lab results? Yes Tech Name Wanda Peters Name of Lab [facility] Phone Number [phone] Lab Reference Number [id number] Initial Comment Caller states she is Wanda Peters from [facility] lab call w/ stat labs for the pt. Nurse Assessment Guidelines Guideline Title Affirmed Question Affirmed Notes Nurse Date/Time (Eastern Time) Disp. Time Eilene Ghazi Time) Disposition Final User 12/01/2014 8:47:20 PM Faxed Lab Results to Provider Yes Rayder, Bettey Mare After Care Instructions Given Call Event Type User Date / Time Description

## 2015-05-31 ENCOUNTER — Ambulatory Visit (INDEPENDENT_AMBULATORY_CARE_PROVIDER_SITE_OTHER): Payer: 59 | Admitting: Family Medicine

## 2015-05-31 ENCOUNTER — Encounter: Payer: Self-pay | Admitting: Family Medicine

## 2015-05-31 VITALS — BP 100/68 | HR 70 | Temp 98.6°F | Ht 67.0 in | Wt 250.5 lb

## 2015-05-31 DIAGNOSIS — R300 Dysuria: Secondary | ICD-10-CM

## 2015-05-31 DIAGNOSIS — Z0000 Encounter for general adult medical examination without abnormal findings: Secondary | ICD-10-CM

## 2015-05-31 LAB — POCT URINALYSIS DIP (MANUAL ENTRY)
Bilirubin, UA: NEGATIVE
GLUCOSE UA: NEGATIVE
Ketones, POC UA: NEGATIVE
Leukocytes, UA: NEGATIVE
Nitrite, UA: POSITIVE — AB
Protein Ur, POC: NEGATIVE
RBC UA: NEGATIVE
SPEC GRAV UA: 1.02
UROBILINOGEN UA: 1
pH, UA: 6

## 2015-05-31 MED ORDER — NITROFURANTOIN MONOHYD MACRO 100 MG PO CAPS: 100.0000 mg | ORAL_CAPSULE | Freq: Two times a day (BID) | ORAL | Status: DC

## 2015-05-31 NOTE — Patient Instructions (Addendum)
Check at work to see if you have had TDap. Let us know.  Keep working on healthy eating, regular exercise and weight loss.  Continue macrobid x 5 days.  Will send urine for culture.

## 2015-05-31 NOTE — Progress Notes (Signed)
39 year old female presents for annual check. She has CPX at GYN Dr. Julien Girt( nml Pap /DVE)  Scheduled in 07/2015. Mammogram 3D: per GYN   Had cholesterol check there, DM screen.  Wt Readings from Last 3 Encounters:  05/31/15 250 lb 8 oz (113.626 kg)  12/01/14 252 lb 8 oz (114.533 kg)  11/21/14 259 lb (117.482 kg)   Exercise: 3 days a week 20-30 min. Sometimes swimming or bike. Diet: Low carb diet, water, high protein.  Bladder spasm and dysuria in last 2 days. Hx of recurrent UTI. Took Macrobid and pyridium. She feel nausea, still some bladder spasms, better. Pressure bilateral CVA  And bloating and heaviness in low abdomen. Usually it goes a way before now with the macrobid. No fever.  She feels she may have gotten given recent travel to [place].  Allergic rhinitis: stable on flonase.   Review of Systems  Constitutional: Negative for fever, fatigue and unexpected weight change.  HENT: Negative for congestion, ear pain, sinus pressure, sneezing, sore throat and trouble swallowing.  Eyes: Negative for pain and itching.  Respiratory: Negative for cough, shortness of breath and wheezing.  Cardiovascular: Negative for chest pain, palpitations and leg swelling.  Gastrointestinal: Positive for constipation. Negative for nausea, abdominal pain, diarrhea and blood in stool.   Only if not drinking fluids  Genitourinary: Negative for dysuria, hematuria, vaginal discharge, difficulty urinating and menstrual problem.  Skin: Negative for rash.  Neurological: Negative for syncope, weakness, light-headedness, numbness and headaches.  Psychiatric/Behavioral: Negative for confusion and dysphoric mood. The patient is not nervous/anxious.       Objective:   Physical Exam  Constitutional: Vital signs are normal. She appears well-developed and well-nourished. She is cooperative. Non-toxic appearance. She does not appear ill. No distress.  HENT:  Head: Normocephalic.  Right Ear:  Hearing, tympanic membrane, external ear and ear canal normal.  Left Ear: Hearing, tympanic membrane, external ear and ear canal normal.  Nose: Nose normal.  Eyes: Conjunctivae, EOM and lids are normal. Pupils are equal, round, and reactive to light. Lids are everted and swept, no foreign bodies found.  Neck: Trachea normal and normal range of motion. Neck supple. Carotid bruit is not present. No mass and no thyromegaly present.  Cardiovascular: Normal rate, regular rhythm, S1 normal, S2 normal, normal heart sounds and intact distal pulses. Exam reveals no gallop.  No murmur heard. Pulmonary/Chest: Effort normal and breath sounds normal. No respiratory distress. She has no wheezes. She has no rhonchi. She has no rales.  Abdominal: Soft. Normal appearance and bowel sounds are normal. She exhibits no distension, no fluid wave, no abdominal bruit and no mass. There is no hepatosplenomegaly. There is mild suprapubic tenderness. There is no rebound, no guarding and mild CVA tenderness tenderness bilaterally. No hernia.  Lymphadenopathy:   She has no cervical adenopathy.   She has no axillary adenopathy.  Neurological: She is alert. She has normal strength. No cranial nerve deficit or sensory deficit.  Skin: Skin is warm, dry and intact. No rash noted.  Psychiatric: Her speech is normal and behavior is normal. Judgment normal. Her mood appears not anxious. Cognition and memory are normal. She does not exhibit a depressed mood.      Assessment & Plan:  The patient's preventative maintenance and recommended screening tests for an annual wellness exam were reviewed in full today. Brought up to date unless services declined.  Counselled on the importance of diet, exercise, and its role in overall health and mortality. The  patient's FH and SH was reviewed, including their home life, tobacco status, and drug and alcohol status.   No family history of colon cancer Vaccine: uptodate with flu, Due  for tdap [id number] Nonsmoker: Pap/ DVE/ mammo/breast exam: GYN Dr. Julien Girt  Has appt in 07/2015

## 2015-05-31 NOTE — Addendum Note (Signed)
Addended by: Carter Kitten on: 05/31/2015 10:25 AM   Modules accepted: Orders

## 2015-05-31 NOTE — Assessment & Plan Note (Addendum)
?   partially treated  UTI with macrobid.  Check UA:  Only positive nitrate.. send for culture.  Continue macrobid for 5 days.

## 2015-05-31 NOTE — Progress Notes (Signed)
Pre visit review using our clinic review tool, if applicable. No additional management support is needed unless otherwise documented below in the visit note. 

## 2015-06-01 LAB — URINE CULTURE

## 2015-06-13 ENCOUNTER — Other Ambulatory Visit (HOSPITAL_COMMUNITY)
Admission: RE | Admit: 2015-06-13 | Discharge: 2015-06-13 | Disposition: A | Discharge status: Home / Self Care | Payer: 59 | Source: Ambulatory Visit | Attending: Occupational Medicine | Admitting: Occupational Medicine

## 2015-10-08 ENCOUNTER — Telehealth: Payer: 59 | Admitting: Family

## 2015-10-08 DIAGNOSIS — A499 Bacterial infection, unspecified: Secondary | ICD-10-CM

## 2015-10-08 DIAGNOSIS — J329 Chronic sinusitis, unspecified: Secondary | ICD-10-CM | POA: Diagnosis not present

## 2015-10-08 DIAGNOSIS — B9689 Other specified bacterial agents as the cause of diseases classified elsewhere: Secondary | ICD-10-CM

## 2015-10-08 MED ORDER — AMOXICILLIN-POT CLAVULANATE 875-125 MG PO TABS: 1.0000 | ORAL_TABLET | Freq: Two times a day (BID) | ORAL | Status: AC

## 2015-10-08 MED FILL — AMOX-CLAV 875-125 MG TABLET: 875-125 | 7 days supply | Qty: 14 | Fill #0

## 2015-10-08 NOTE — Progress Notes (Signed)

## 2015-11-30 ENCOUNTER — Encounter: Payer: Self-pay | Admitting: Genetic Counselor

## 2016-04-15 ENCOUNTER — Telehealth: Payer: 59 | Admitting: Nurse Practitioner

## 2016-04-15 DIAGNOSIS — J0121 Acute recurrent ethmoidal sinusitis: Secondary | ICD-10-CM

## 2016-04-15 MED ORDER — AMOXICILLIN-POT CLAVULANATE 875-125 MG PO TABS: 1.0000 | ORAL_TABLET | Freq: Two times a day (BID) | ORAL | 0 refills | Status: DC

## 2016-04-15 NOTE — Progress Notes (Signed)

## 2016-04-16 MED FILL — AMOX TR-K CLV 875-125 MG TA: 875-125 | 10 days supply | Qty: 20 | Fill #0

## 2016-06-15 ENCOUNTER — Telehealth: Payer: 59 | Admitting: Family

## 2016-06-15 DIAGNOSIS — R6889 Other general symptoms and signs: Secondary | ICD-10-CM

## 2016-06-15 MED ORDER — OSELTAMIVIR PHOSPHATE 75 MG PO CAPS: 75.0000 mg | ORAL_CAPSULE | Freq: Two times a day (BID) | ORAL | 0 refills | Status: DC

## 2016-06-15 NOTE — Progress Notes (Signed)

## 2016-07-23 ENCOUNTER — Telehealth: Payer: 59 | Admitting: Nurse Practitioner

## 2016-07-23 DIAGNOSIS — L259 Unspecified contact dermatitis, unspecified cause: Secondary | ICD-10-CM

## 2016-07-23 MED ORDER — PREDNISONE 10 MG PO TABS: 10.0000 mg | ORAL_TABLET | Freq: Every day | ORAL | 0 refills | Status: AC

## 2016-07-23 NOTE — Progress Notes (Signed)
E Visit for Rash  We are sorry that you are not feeling well. Here is how we plan to help!  Based on what you shared with me it looks like you have contact dermatitis.  Contact dermatitis is a skin rash caused by something that touches the skin and causes irritation or inflammation.  Your skin may be red, swollen, dry, cracked, and itch.  The rash should go away in a few days but can last a few weeks.  If you get a rash, it's important to figure out what caused it so the irritant can be avoided in the future. and I have prescribed Prednisone 10 mg daily for 5 days           HOME CARE:   Take cool showers and avoid direct sunlight.  Apply cool compress or wet dressings.  Take a bath in an oatmeal bath.  Sprinkle content of one Aveeno packet under running faucet with comfortably warm water.  Bathe for 15-20 minutes, 1-2 times daily.  Pat dry with a towel. Do not rub the rash.  Use hydrocortisone cream.  Take an antihistamine like Benadryl for widespread rashes that itch.  The adult dose of Benadryl is 25-50 mg by mouth 4 times daily.  Caution:  This type of medication may cause sleepiness.  Do not drink alcohol, drive, or operate dangerous machinery while taking antihistamines.  Do not take these medications if you have prostate enlargement.  Read package instructions thoroughly on all medications that you take.  GET HELP RIGHT AWAY IF:   Symptoms don't go away after treatment.  Severe itching that persists.  If you rash spreads or swells.  If you rash begins to smell.  If it blisters and opens or develops a yellow-brown crust.  You develop a fever.  You have a sore throat.  You become short of breath.  MAKE SURE YOU:  Understand these instructions. Will watch your condition. Will get help right away if you are not doing well or get worse.  Thank you for choosing an e-visit. Your e-visit answers were reviewed by a board certified advanced clinical practitioner to  complete your personal care plan. Depending upon the condition, your plan could have included both over the counter or prescription medications. Please review your pharmacy choice. Be sure that the pharmacy you have chosen is open so that you can pick up your prescription now.  If there is a problem you may message your provider in MyChart to have the prescription routed to another pharmacy. Your safety is important to us. If you have drug allergies check your prescription carefully.  For the next 24 hours, you can use MyChart to ask questions about today's visit, request a non-urgent call back, or ask for a work or school excuse from your e-visit provider. You will get an email in the next two days asking about your experience. I hope that your e-visit has been valuable and will speed your recovery.     

## 2016-07-24 MED FILL — predniSONE 10 MG TABS: 10 | 5 days supply | Qty: 5 | Fill #0

## 2016-10-24 ENCOUNTER — Telehealth: Payer: 59 | Admitting: Family

## 2016-10-24 DIAGNOSIS — J329 Chronic sinusitis, unspecified: Secondary | ICD-10-CM

## 2016-10-24 DIAGNOSIS — B9689 Other specified bacterial agents as the cause of diseases classified elsewhere: Secondary | ICD-10-CM

## 2016-10-24 MED ORDER — AMOXICILLIN-POT CLAVULANATE 875-125 MG PO TABS: 1.0000 | ORAL_TABLET | Freq: Two times a day (BID) | ORAL | 0 refills | Status: AC

## 2016-10-24 MED FILL — AMOX TR-K CLV 875-125 MG TA: 875-125 | 7 days supply | Qty: 14 | Fill #0

## 2016-10-24 NOTE — Progress Notes (Signed)

## 2017-05-16 ENCOUNTER — Telehealth: Payer: 59 | Admitting: Nurse Practitioner

## 2017-05-16 DIAGNOSIS — J0100 Acute maxillary sinusitis, unspecified: Secondary | ICD-10-CM | POA: Diagnosis not present

## 2017-05-16 MED ORDER — AMOXICILLIN-POT CLAVULANATE 875-125 MG PO TABS: 1.0000 | ORAL_TABLET | Freq: Two times a day (BID) | ORAL | 0 refills | Status: DC

## 2017-05-16 NOTE — Progress Notes (Signed)

## 2017-05-19 ENCOUNTER — Telehealth: Payer: 59 | Admitting: Nurse Practitioner

## 2017-05-19 DIAGNOSIS — R21 Rash and other nonspecific skin eruption: Secondary | ICD-10-CM | POA: Diagnosis not present

## 2017-05-19 NOTE — Progress Notes (Signed)
Thank you so much for the picture you sent. It looks  Like psoriasis to me. You can try tegrin shampoo otc. You need to see a dermatologist.  Based on what you shared with me it looks like you have a serious condition that should be evaluated in a face to face office visit.  NOTE: Even if you have entered your credit card information for this eVisit, you will not be charged.   If you are having a true medical emergency please call 911.  If you need an urgent face to face visit, [facility] has four urgent care centers for your convenience.  If you need care fast and have a high deductible or no insurance consider:   [email or web address]  [phone]  [address] [place], [place] [address] 8 am to 8 pm Monday-Friday 10 am to 4 pm Saturday-Sunday   The following sites will take your  insurance:    . [facility] [place] a Provider at this Location  [address] [place], [place] [address] . 10 am to 8 pm Monday-Friday . 12 pm to 8 pm Saturday-Sunday   . [facility] [place] a Provider at this Location  [address] [place], [place], [place] [address] . 8 am to 8 pm Monday-Friday . 9 am to 6 pm Saturday . 11 am to 6 pm Sunday   . [facility] at [place] Get Driving Directions  [address] [place], [place] [address] . 8 am to 8 pm Monday-Friday . 8 am to 4 pm Saturday-Sunday   Your e-visit answers were reviewed by a board certified advanced clinical practitioner to complete your personal care plan.  Thank you for using e-Visits.

## 2017-05-21 DIAGNOSIS — L400 Psoriasis vulgaris: Secondary | ICD-10-CM | POA: Diagnosis not present

## 2017-06-23 ENCOUNTER — Telehealth: Payer: Self-pay | Admitting: Family Medicine

## 2017-06-23 DIAGNOSIS — Z13220 Encounter for screening for lipoid disorders: Secondary | ICD-10-CM

## 2017-06-23 NOTE — Telephone Encounter (Signed)
-----   Message from Ellamae Sia sent at 06/17/2017  6:31 PM EST ----- Regarding: Lab orders for Thursday, 1.17.19 Patient is scheduled for CPX labs, please order future labs, Thanks , Karna Christmas

## 2017-06-25 ENCOUNTER — Other Ambulatory Visit (INDEPENDENT_AMBULATORY_CARE_PROVIDER_SITE_OTHER): Payer: 59

## 2017-06-25 DIAGNOSIS — Z13220 Encounter for screening for lipoid disorders: Secondary | ICD-10-CM | POA: Diagnosis not present

## 2017-06-25 LAB — LIPID PANEL
Cholesterol: 154 mg/dL (ref 0–200)
HDL: 47.3 mg/dL (ref >39.00)
LDL Cholesterol: 96 mg/dL (ref 0–99)
NONHDL: 106.43
TRIGLYCERIDES: 54 mg/dL (ref 0.0–149.0)
Total CHOL/HDL Ratio: 3
VLDL: 10.8 mg/dL (ref 0.0–40.0)

## 2017-06-25 LAB — COMPREHENSIVE METABOLIC PANEL
ALK PHOS: 70 U/L (ref 39–117)
ALT: 15 U/L (ref 0–35)
AST: 16 U/L (ref 0–37)
Albumin: 3.9 g/dL (ref 3.5–5.2)
BUN: 13 mg/dL (ref 6–23)
CALCIUM: 8.8 mg/dL (ref 8.4–10.5)
CO2: 27 mEq/L (ref 19–32)
Chloride: 105 mEq/L (ref 96–112)
Creatinine, Ser: 0.73 mg/dL (ref 0.40–1.20)
GFR: 92.25 mL/min (ref >60.00)
GLUCOSE: 95 mg/dL (ref 70–99)
POTASSIUM: 3.9 meq/L (ref 3.5–5.1)
Sodium: 138 mEq/L (ref 135–145)
TOTAL PROTEIN: 6.4 g/dL (ref 6.0–8.3)
Total Bilirubin: 0.5 mg/dL (ref 0.2–1.2)

## 2017-06-29 ENCOUNTER — Other Ambulatory Visit: Payer: Self-pay | Admitting: *Deleted

## 2017-06-30 ENCOUNTER — Other Ambulatory Visit: Payer: Self-pay

## 2017-06-30 ENCOUNTER — Encounter: Payer: Self-pay | Admitting: Family Medicine

## 2017-06-30 ENCOUNTER — Ambulatory Visit (INDEPENDENT_AMBULATORY_CARE_PROVIDER_SITE_OTHER): Payer: 59 | Admitting: Family Medicine

## 2017-06-30 VITALS — BP 110/76 | HR 62 | Temp 98.7°F | Ht 66.5 in | Wt 277.5 lb

## 2017-06-30 DIAGNOSIS — Z6841 Body Mass Index (BMI) 40.0 and over, adult: Secondary | ICD-10-CM | POA: Diagnosis not present

## 2017-06-30 DIAGNOSIS — M7552 Bursitis of left shoulder: Secondary | ICD-10-CM | POA: Diagnosis not present

## 2017-06-30 DIAGNOSIS — R5383 Other fatigue: Secondary | ICD-10-CM | POA: Diagnosis not present

## 2017-06-30 MED ORDER — DICLOFENAC SODIUM 75 MG PO TBEC: 75.0000 mg | DELAYED_RELEASE_TABLET | Freq: Two times a day (BID) | ORAL | 0 refills | Status: DC

## 2017-06-30 MED FILL — DICLOFENAC SODIUM 75 MG TAB: 75 | 15 days supply | Qty: 30 | Fill #0

## 2017-06-30 NOTE — Progress Notes (Signed)
Subjective:    Patient ID: Wanda Peters, female    DOB: [birth year], 42 y.o.   MRN: [id number]  HPI   The patient is here for annual wellness exam and preventative care.   She has CPX at GYN Dr. Julien Girt( nml Pap /DVE)   Last OV 2017.Marland Kitchen Plans to follow up there.   She has been feeling more tired lately x 6-8 months.  Falls a sleep well , wakes up several times.  No snoring, no apneic spells. No depression, no anxiety. No heavy bleeding but does have a lot of clots... Longer than it was. No hair loss, no constipation, no cold intolerance, does have dry skin in the winter.  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  Does have some ankles and knee and shouders.   Having a lot of issues losing weight. Has tried to increase water. Wt Readings from Last 3 Encounters:  06/30/17 277 lb 8 oz (125.9 kg)  05/31/15 250 lb 8 oz (113.6 kg)  12/01/14 252 lb 8 oz (114.5 kg)   diet:  Lots of water, decreasing carbs, increasing protein  exercise: tried boot camp last summer until Nov. walking some  She is on weight list to see Dr. Trixie Rude bariatric MD.   Left shoulder pain started with boot camp.  Pain in shoulder, achy. Comes a goes.  No fall. Pain with int, ext rotation.  Using ibuprofen off and on.  no numbness, no weakness.  Neck pain, no fever  no loss of control of bowels/bladder   reviewed labs in detail.  Social History /Family History/Past Medical History reviewed in detail and updated in EMR if needed. Blood pressure 110/76, pulse 62, temperature 98.7 F (37.1 C), temperature source Oral, height 5' 6.5" (1.689 m), weight 277 lb 8 oz (125.9 kg), last menstrual period 06/25/2017.  Review of Systems  Constitutional: Positive for fatigue. Negative for fever.  HENT: Negative for congestion.   Eyes: Negative for pain.  Respiratory: Negative for cough and shortness of breath.   Cardiovascular: Negative for chest pain, palpitations and leg swelling.  Gastrointestinal:  Negative for abdominal pain.  Genitourinary: Negative for dysuria and vaginal bleeding.  Musculoskeletal: Negative for back pain.  Neurological: Negative for syncope, light-headedness and headaches.  Psychiatric/Behavioral: Negative for dysphoric mood.       Objective:   Physical Exam  Constitutional: Vital signs are normal. She appears well-developed and well-nourished. She is cooperative.  Non-toxic appearance. She does not appear ill. No distress.  Morbid obesity Body mass index is 44.12 kg/m.   HENT:  Head: Normocephalic.  Right Ear: Hearing, tympanic membrane, external ear and ear canal normal.  Left Ear: Hearing, tympanic membrane, external ear and ear canal normal.  Nose: Nose normal.  Eyes: Conjunctivae, EOM and lids are normal. Pupils are equal, round, and reactive to light. Lids are everted and swept, no foreign bodies found.  Neck: Trachea normal and normal range of motion. Neck supple. Carotid bruit is not present. No thyroid mass and no thyromegaly present.  Cardiovascular: Normal rate, regular rhythm, S1 normal, S2 normal, normal heart sounds and intact distal pulses. Exam reveals no gallop.  No murmur heard. Pulmonary/Chest: Effort normal and breath sounds normal. No respiratory distress. She has no wheezes. She has no rhonchi. She has no rales.  Abdominal: Soft. Normal appearance and bowel sounds are normal. She exhibits no distension, no fluid wave, no abdominal bruit and no mass. There is no hepatosplenomegaly. There is no tenderness. There is no rebound,  no guarding and no CVA tenderness. No hernia.  Musculoskeletal:       Left shoulder: She exhibits tenderness. She exhibits normal range of motion and no bony tenderness.       Cervical back: Normal.  ttp over lateral shoulder over bursa.  Slightly positive neer's  Neg drop arm test.  Lymphadenopathy:    She has no cervical adenopathy.    She has no axillary adenopathy.  Neurological: She is alert. She has normal  strength. No cranial nerve deficit or sensory deficit.  Skin: Skin is warm, dry and intact. No rash noted.  Psychiatric: Her speech is normal and behavior is normal. Judgment normal. Her mood appears not anxious. Cognition and memory are normal. She does not exhibit a depressed mood.          Assessment & Plan:  The patient's preventative maintenance and recommended screening tests for an annual wellness exam were reviewed in full today. Brought up to date unless services declined.  Counselled on the importance of diet, exercise, and its role in overall health and mortality. The patient's FH and SH was reviewed, including their home life, tobacco status, and drug and alcohol status.   No family history of colon cancer Vaccine: uptodate with flu, Due for tdap.. Will look into whether she has had at employee. GSU:[id number] Nonsmoker: Pap/ DVE/ mammo/breast exam: GYN Dr. Julien Girt

## 2017-06-30 NOTE — Assessment & Plan Note (Signed)
Treat with home PT, NSAIDs, ice. Info given.

## 2017-06-30 NOTE — Assessment & Plan Note (Signed)
No sign of sleep apnea or depression.. eval with labs.

## 2017-06-30 NOTE — Assessment & Plan Note (Signed)
Counseled on lifestyle changes. Eval for secondary causes. Agree with referral to Dr. Trixie Rude in bariatrics.

## 2017-06-30 NOTE — Addendum Note (Signed)
Addended by: Pilar Grammes on: 06/30/2017 05:33 PM   Modules accepted: Orders

## 2017-06-30 NOTE — Patient Instructions (Addendum)
Please stop at the lab to have labs drawn.   Call us with date of last tetanus.  Start home physical therapy and diclofenac twice daily for left shoulder pain.

## 2017-07-14 ENCOUNTER — Other Ambulatory Visit: Payer: Self-pay | Admitting: *Deleted

## 2017-07-14 ENCOUNTER — Other Ambulatory Visit (INDEPENDENT_AMBULATORY_CARE_PROVIDER_SITE_OTHER): Payer: 59

## 2017-07-14 DIAGNOSIS — R5383 Other fatigue: Secondary | ICD-10-CM | POA: Diagnosis not present

## 2017-07-14 LAB — VITAMIN D 25 HYDROXY (VIT D DEFICIENCY, FRACTURES): VITD: 20.58 ng/mL — ABNORMAL LOW (ref 30.00–100.00)

## 2017-07-14 LAB — TSH: TSH: 4.03 u[IU]/mL (ref 0.35–4.50)

## 2017-07-14 LAB — CBC WITH DIFFERENTIAL/PLATELET
Basophils Absolute: 0 10*3/uL (ref 0.0–0.1)
Basophils Relative: 0.4 % (ref 0.0–3.0)
EOS PCT: 1.6 % (ref 0.0–5.0)
Eosinophils Absolute: 0.1 10*3/uL (ref 0.0–0.7)
HCT: 39.5 % (ref 36.0–46.0)
Hemoglobin: 13.4 g/dL (ref 12.0–15.0)
Lymphocytes Relative: 29.6 % (ref 12.0–46.0)
Lymphs Abs: 1.8 10*3/uL (ref 0.7–4.0)
MCHC: 33.8 g/dL (ref 30.0–36.0)
MCV: 94.3 fl (ref 78.0–100.0)
MONOS PCT: 7.1 % (ref 3.0–12.0)
Monocytes Absolute: 0.4 10*3/uL (ref 0.1–1.0)
NEUTROS ABS: 3.7 10*3/uL (ref 1.4–7.7)
NEUTROS PCT: 61.3 % (ref 43.0–77.0)
PLATELETS: 227 10*3/uL (ref 150.0–400.0)
RBC: 4.19 Mil/uL (ref 3.87–5.11)
RDW: 13.1 % (ref 11.5–15.5)
WBC: 5.9 10*3/uL (ref 4.0–10.5)

## 2017-07-14 LAB — VITAMIN B12: Vitamin B-12: 309 pg/mL (ref 211–911)

## 2017-07-14 LAB — T3, FREE: T3, Free: 2.8 pg/mL (ref 2.3–4.2)

## 2017-07-14 LAB — T4, FREE: Free T4: 0.91 ng/dL (ref 0.60–1.60)

## 2017-07-14 MED ORDER — VITAMIN D3 1.25 MG (50000 UT) PO CAPS: 1.0000 | ORAL_CAPSULE | ORAL | 0 refills | Status: DC

## 2017-07-14 MED FILL — VITAMIN D3 50000 UNIT CAPS: 1.25 MG | 84 days supply | Qty: 12 | Fill #0

## 2017-07-15 ENCOUNTER — Encounter: Payer: Self-pay | Admitting: Family Medicine

## 2017-07-19 ENCOUNTER — Telehealth: Payer: 59 | Admitting: Family

## 2017-07-19 DIAGNOSIS — J028 Acute pharyngitis due to other specified organisms: Secondary | ICD-10-CM

## 2017-07-19 DIAGNOSIS — B9689 Other specified bacterial agents as the cause of diseases classified elsewhere: Secondary | ICD-10-CM

## 2017-07-19 MED ORDER — PREDNISONE 5 MG PO TABS: 5.0000 mg | ORAL_TABLET | ORAL | 0 refills | Status: DC

## 2017-07-19 MED ORDER — AZITHROMYCIN 250 MG PO TABS: ORAL_TABLET | ORAL | 0 refills | Status: DC

## 2017-07-19 MED ORDER — BENZONATATE 100 MG PO CAPS: 100.0000 mg | ORAL_CAPSULE | Freq: Three times a day (TID) | ORAL | 0 refills | Status: DC | PRN

## 2017-07-19 NOTE — Progress Notes (Signed)

## 2017-08-20 ENCOUNTER — Encounter (INDEPENDENT_AMBULATORY_CARE_PROVIDER_SITE_OTHER): Payer: Self-pay

## 2017-08-26 ENCOUNTER — Ambulatory Visit (INDEPENDENT_AMBULATORY_CARE_PROVIDER_SITE_OTHER): Payer: 59 | Admitting: Family Medicine

## 2017-08-26 ENCOUNTER — Encounter (INDEPENDENT_AMBULATORY_CARE_PROVIDER_SITE_OTHER): Payer: Self-pay | Admitting: Family Medicine

## 2017-08-26 VITALS — BP 115/78 | HR 62 | Temp 98.1°F | Ht 68.0 in | Wt 281.0 lb

## 2017-08-26 DIAGNOSIS — R5383 Other fatigue: Secondary | ICD-10-CM | POA: Diagnosis not present

## 2017-08-26 DIAGNOSIS — Z0289 Encounter for other administrative examinations: Secondary | ICD-10-CM

## 2017-08-26 DIAGNOSIS — R0602 Shortness of breath: Secondary | ICD-10-CM | POA: Diagnosis not present

## 2017-08-26 DIAGNOSIS — E559 Vitamin D deficiency, unspecified: Secondary | ICD-10-CM

## 2017-08-26 DIAGNOSIS — Z1331 Encounter for screening for depression: Secondary | ICD-10-CM | POA: Diagnosis not present

## 2017-08-26 DIAGNOSIS — Z6841 Body Mass Index (BMI) 40.0 and over, adult: Secondary | ICD-10-CM

## 2017-08-26 NOTE — Progress Notes (Signed)
Office: [phone]  /  Fax: [phone]   Dear Dr. Diona Browner,   Thank you for referring Wanda Peters to our clinic. The following note includes my evaluation and treatment recommendations.  HPI:   Chief Complaint: OBESITY    Wanda Peters has been referred by Jinny Sanders, MD for consultation regarding her obesity and obesity related comorbidities.    Wanda Peters (MR# [id number]) is a 42 y.o. female who presents on 08/26/2017 for obesity evaluation and treatment. Current BMI is Body mass index is 42.73 kg/m.Marland Kitchen Captola has been struggling with her weight for many years and has been unsuccessful in either losing weight, maintaining weight loss, or reaching her healthy weight goal.     Wanda Peters attended our information session and states she is currently in the action stage of change and ready to dedicate time achieving and maintaining a healthier weight. Jazzma is interested in becoming our patient and working on intensive lifestyle modifications including (but not limited to) diet, exercise and weight loss.    Wanda Peters states her family eats meals together she thinks her family will eat healthier with  her her desired weight loss is 100 lbs she has been heavy most of  her life she started gaining weight at puberty her heaviest weight ever was 281 lbs. she has significant food cravings issues  she is frequently drinking liquids with calories she frequently makes poor food choices she frequently eats larger portions than normal  she has binge eating behaviors she struggles with emotional eating    Fatigue Wanda Peters feels her energy is lower than it should be. This has worsened with weight gain and has not worsened recently. Wanda Peters admits to daytime somnolence and  admits to waking up still tired. Patient is at risk for obstructive sleep apnea. Patent has a history of symptoms of daytime fatigue and morning fatigue. Patient generally gets 4 to 6  hours of sleep per night, and states they generally have restless sleep. Snoring is present. Apneic episodes are not present. Epworth Sleepiness Score is 1  Dyspnea on exertion Wanda Peters notes increasing shortness of breath with exercising and seems to be worsening over time with weight gain. She notes getting out of breath sooner with activity than she used to. This has not gotten worse recently. Wanda Peters denies orthopnea.  Vitamin D deficiency Wanda Peters has a diagnosis of vitamin D deficiency. She has been taking vit D for one month and still notes fatigue. Wanda Peters denies nausea, vomiting or muscle weakness.  Depression Screen Wanda Peters's Food and Mood (modified PHQ-9) score was  Depression screen PHQ 2/9 08/26/2017  Decreased Interest 1  Down, Depressed, Hopeless 1  PHQ - 2 Score 2  Altered sleeping 2  Tired, decreased energy 3  Change in appetite 2  Feeling bad or failure about yourself  1  Trouble concentrating 0  Moving slowly or fidgety/restless 0  Suicidal thoughts 0  PHQ-9 Score 10  Difficult doing work/chores Not difficult at all    ALLERGIES: Allergies  Allergen Reactions   Sulfonamide Derivatives     REACTION: rash    MEDICATIONS: Current Outpatient Medications on File Prior to Visit  Medication Sig Dispense Refill   Cholecalciferol (VITAMIN D3) 50000 units CAPS Take 1 capsule by mouth every 7 (seven) days. 12 capsule 0   fexofenadine (ALLEGRA) 180 MG tablet Take 180 mg by mouth daily.     fluticasone (FLONASE) 50 MCG/ACT nasal spray Place 1 spray into both nostrils daily.     No current  facility-administered medications on file prior to visit.     PAST MEDICAL HISTORY: Past Medical History:  Diagnosis Date   Dry skin    Fatigue    Rupture of ovarian cyst    Seasonal allergies    Swelling of extremity    Trouble in sleeping    Vitamin D deficiency     PAST SURGICAL HISTORY: History reviewed. No pertinent surgical history.  SOCIAL  HISTORY: Social History   Tobacco Use   Smoking status: Never Smoker   Smokeless tobacco: Never Used  Substance Use Topics   Alcohol use: Yes    Alcohol/week: 0.0 oz    Comment: socially   Drug use: No    FAMILY HISTORY: Family History  Problem Relation Age of Onset   Cancer Mother    Obesity Father     ROS: Review of Systems  Constitutional: Positive for malaise/fatigue.  HENT: Positive for congestion (nasal stuffiness).   Eyes:       Wear Glasses or Contacts  Respiratory: Positive for shortness of breath (on exertion).   Cardiovascular: Negative for orthopnea.  Gastrointestinal: Negative for nausea and vomiting.  Musculoskeletal:       Negative for muscle weakness  Skin:       Dryness   Neurological: Positive for headaches.  Psychiatric/Behavioral: The patient has insomnia.     PHYSICAL EXAM: Blood pressure 115/78, pulse 62, temperature 98.1 F (36.7 C), temperature source Oral, height 5\' 8"  (1.727 m), weight 281 lb (127.5 kg), SpO2 100 %. Body mass index is 42.73 kg/m. Physical Exam  Constitutional: She is oriented to person, place, and time. She appears well-developed and well-nourished.  HENT:  Head: Normocephalic and atraumatic.  Nose: Nose normal.  Eyes: EOM are normal. No scleral icterus.  Neck: Normal range of motion. Neck supple. No thyromegaly present.  Cardiovascular: Normal rate and regular rhythm.  Pulmonary/Chest: Effort normal. No respiratory distress.  Abdominal: Soft. There is no tenderness.  +obesity  Musculoskeletal: Normal range of motion.  Range of Motion normal in all 4 extremities  Neurological: She is alert and oriented to person, place, and time. Coordination normal.  Skin: Skin is warm and dry.  Psychiatric: She has a normal mood and affect. Her behavior is normal.  Vitals reviewed.   RECENT LABS AND TESTS: BMET    Component Value Date/Time   NA 138 06/25/2017 0854   K 3.9 06/25/2017 0854   CL 105 06/25/2017 0854    CO2 27 06/25/2017 0854   GLUCOSE 95 06/25/2017 0854   BUN 13 06/25/2017 0854   CREATININE 0.73 06/25/2017 0854   CREATININE 0.63 12/01/2014 1532   CALCIUM 8.8 06/25/2017 0854   GFRNONAA 102 05/12/2008 0942   GFRAA 123 05/12/2008 0942   Lab Results  Component Value Date   HGBA1C 5.3 05/12/2008   No results found for: INSULIN CBC    Component Value Date/Time   WBC 5.9 07/14/2017 0905   RBC 4.19 07/14/2017 0905   HGB 13.4 07/14/2017 0905   HCT 39.5 07/14/2017 0905   PLT 227.0 07/14/2017 0905   MCV 94.3 07/14/2017 0905   MCH 31.2 12/01/2014 1532   MCHC 33.8 07/14/2017 0905   RDW 13.1 07/14/2017 0905   LYMPHSABS 1.8 07/14/2017 0905   MONOABS 0.4 07/14/2017 0905   EOSABS 0.1 07/14/2017 0905   BASOSABS 0.0 07/14/2017 0905   Iron/TIBC/Ferritin/ %Sat No results found for: IRON, TIBC, FERRITIN, IRONPCTSAT Lipid Panel     Component Value Date/Time   CHOL 154 06/25/2017 0854  TRIG 54.0 06/25/2017 0854   HDL 47.30 06/25/2017 0854   CHOLHDL 3 06/25/2017 0854   VLDL 10.8 06/25/2017 0854   LDLCALC 96 06/25/2017 0854   Hepatic Function Panel     Component Value Date/Time   PROT 6.4 06/25/2017 0854   ALBUMIN 3.9 06/25/2017 0854   AST 16 06/25/2017 0854   ALT 15 06/25/2017 0854   ALKPHOS 70 06/25/2017 0854   BILITOT 0.5 06/25/2017 0854   BILIDIR 0.1 05/12/2008 0942      Component Value Date/Time   TSH 4.03 07/14/2017 0905   TSH 1.688 12/01/2014 1532   TSH 1.93 05/12/2008 0942   Vitamin D Results for KENSEY, LUEPKE (MRN [id number]) as of 08/26/2017 14:03  Ref. Range 07/14/2017 09:05  VITD Latest Ref Range: 30.00 - 100.00 ng/mL 20.58 (L)    ECG  shows NSR with a rate of 62 BPM INDIRECT CALORIMETER done today shows a VO2 of 291 and a REE of 2022.  Her calculated basal metabolic rate is 0932 thus her basal metabolic rate is better than expected.    ASSESSMENT AND PLAN: Other fatigue - Plan: EKG 12-Lead, Comprehensive metabolic panel, Hemoglobin A1c, Insulin,  random, Lipid Panel With LDL/HDL Ratio  Shortness of breath on exertion  Vitamin D deficiency - Plan: VITAMIN D 25 Hydroxy (Vit-D Deficiency, Fractures)  Depression screening  Class 3 severe obesity with serious comorbidity and body mass index (BMI) of 40.0 to 44.9 in adult, unspecified obesity type ([place])  PLAN: Fatigue Sihaam was informed that her fatigue may be related to obesity, depression or many other causes. Labs will be ordered, and in the meanwhile Charmika has agreed to work on diet, exercise and weight loss to help with fatigue. Proper sleep hygiene was discussed including the need for 7-8 hours of quality sleep each night. A sleep study was not ordered based on symptoms and Epworth score.  Dyspnea on exertion Kerry-Anne's shortness of breath appears to be obesity related and exercise induced. She has agreed to work on weight loss and gradually increase exercise to treat her exercise induced shortness of breath. If Alyha follows our instructions and loses weight without improvement of her shortness of breath, we will plan to refer to pulmonology. We will monitor this condition regularly. Aleyna agrees to this plan.  Vitamin D Deficiency Kiylee was informed that low vitamin D levels contributes to fatigue and are associated with obesity, breast, and colon cancer. She agrees to continue to take prescription Vit D @50 ,000 IU every week. We will check labs and she will follow up for routine testing of vitamin D, at least 2-3 times per year. She was informed of the risk of over-replacement of vitamin D and agrees to not increase her dose unless she discusses this with [name] first. Lakima agrees to follow up with our clinic in 2 weeks.  Depression Screen Shantoya had a moderately positive depression screening. Depression is commonly associated with obesity and often results in emotional eating behaviors. We will monitor this closely and work on CBT to help improve the  non-hunger eating patterns. Referral to Psychology may be required if no improvement is seen as she continues in our clinic.  Obesity Mardy is currently in the action stage of change and her goal is to continue with weight loss efforts. I recommend Takeria begin the structured treatment plan as follows:  She has agreed to follow the Category 3 plan Kazoua has been instructed to eventually work up to a goal of 150 minutes of combined  cardio and strengthening exercise per week for weight loss and overall health benefits. We discussed the following Behavioral Modification Strategies today: increasing lean protein intake and decreasing simple carbohydrates    She was informed of the importance of frequent follow up visits to maximize her success with intensive lifestyle modifications for her multiple health conditions. She was informed we would discuss her lab results at her next visit unless there is a critical issue that needs to be addressed sooner. Kendallyn agreed to keep her next visit at the agreed upon time to discuss these results.    OBESITY BEHAVIORAL INTERVENTION VISIT  Today's visit was # 1 out of 22.  Starting weight: 281 lbs Starting date: 08/26/17 Today's weight : 281 lbs Today's date: 08/26/2017 Total lbs lost to date: 0 (Patients must lose 7 lbs in the first 6 months to continue with counseling)   ASK: We discussed the diagnosis of obesity with Wanda Peters today and Marylynne agreed to give [name] permission to discuss obesity behavioral modification therapy today.  ASSESS: Serenah has the diagnosis of obesity and her BMI today is 42.74 Niema is in the action stage of change   ADVISE: Evangelina was educated on the multiple health risks of obesity as well as the benefit of weight loss to improve her health. She was advised of the need for long term treatment and the importance of lifestyle modifications.  AGREE: Multiple dietary modification  options and treatment options were discussed and  Kalika agreed to the above obesity treatment plan.   I, Doreene Nest, am acting as transcriptionist for  Dennard Nip, MD   I have reviewed the above documentation for accuracy and completeness, and I agree with the above. -Dennard Nip, MD

## 2017-08-27 LAB — COMPREHENSIVE METABOLIC PANEL
A/G RATIO: 1.8 (ref 1.2–2.2)
ALK PHOS: 111 IU/L (ref 39–117)
ALT: 19 IU/L (ref 0–32)
AST: 19 IU/L (ref 0–40)
Albumin: 4.4 g/dL (ref 3.5–5.5)
BUN/Creatinine Ratio: 18 (ref 9–23)
BUN: 13 mg/dL (ref 6–24)
Bilirubin Total: 0.2 mg/dL (ref 0.0–1.2)
CALCIUM: 9.4 mg/dL (ref 8.7–10.2)
CHLORIDE: 104 mmol/L (ref 96–106)
CO2: 22 mmol/L (ref 20–29)
Creatinine, Ser: 0.74 mg/dL (ref 0.57–1.00)
GFR calc Af Amer: 115 mL/min/{1.73_m2} (ref >59)
GFR calc non Af Amer: 100 mL/min/{1.73_m2} (ref >59)
Globulin, Total: 2.5 g/dL (ref 1.5–4.5)
Glucose: 81 mg/dL (ref 65–99)
POTASSIUM: 4.3 mmol/L (ref 3.5–5.2)
SODIUM: 139 mmol/L (ref 134–144)
Total Protein: 6.9 g/dL (ref 6.0–8.5)

## 2017-08-27 LAB — HEMOGLOBIN A1C
Est. average glucose Bld gHb Est-mCnc: 111 mg/dL
Hgb A1c MFr Bld: 5.5 % (ref 4.8–5.6)

## 2017-08-27 LAB — LIPID PANEL WITH LDL/HDL RATIO
Cholesterol, Total: 180 mg/dL (ref 100–199)
HDL: 60 mg/dL (ref >39)
LDL Calculated: 104 mg/dL — ABNORMAL HIGH (ref 0–99)
LDl/HDL Ratio: 1.7 ratio (ref 0.0–3.2)
Triglycerides: 79 mg/dL (ref 0–149)
VLDL Cholesterol Cal: 16 mg/dL (ref 5–40)

## 2017-08-27 LAB — INSULIN, RANDOM: INSULIN: 10.3 u[IU]/mL (ref 2.6–24.9)

## 2017-08-27 LAB — VITAMIN D 25 HYDROXY (VIT D DEFICIENCY, FRACTURES): VIT D 25 HYDROXY: 35.9 ng/mL (ref 30.0–100.0)

## 2017-08-28 ENCOUNTER — Encounter (INDEPENDENT_AMBULATORY_CARE_PROVIDER_SITE_OTHER): Payer: Self-pay | Admitting: Family Medicine

## 2017-09-08 DIAGNOSIS — H5213 Myopia, bilateral: Secondary | ICD-10-CM | POA: Diagnosis not present

## 2017-09-08 DIAGNOSIS — H52223 Regular astigmatism, bilateral: Secondary | ICD-10-CM | POA: Diagnosis not present

## 2017-09-09 ENCOUNTER — Ambulatory Visit (INDEPENDENT_AMBULATORY_CARE_PROVIDER_SITE_OTHER): Payer: 59 | Admitting: Family Medicine

## 2017-09-09 VITALS — BP 118/76 | HR 69 | Temp 97.9°F | Ht 68.0 in | Wt 277.0 lb

## 2017-09-09 DIAGNOSIS — Z9189 Other specified personal risk factors, not elsewhere classified: Secondary | ICD-10-CM | POA: Diagnosis not present

## 2017-09-09 DIAGNOSIS — Z6841 Body Mass Index (BMI) 40.0 and over, adult: Secondary | ICD-10-CM | POA: Diagnosis not present

## 2017-09-09 DIAGNOSIS — E559 Vitamin D deficiency, unspecified: Secondary | ICD-10-CM

## 2017-09-09 DIAGNOSIS — E8881 Metabolic syndrome: Secondary | ICD-10-CM

## 2017-09-09 DIAGNOSIS — E66813 Obesity, class 3: Secondary | ICD-10-CM

## 2017-09-09 DIAGNOSIS — E88819 Insulin resistance, unspecified: Secondary | ICD-10-CM

## 2017-09-09 MED ORDER — METFORMIN HCL 500 MG PO TABS: 500.0000 mg | ORAL_TABLET | Freq: Every day | ORAL | 0 refills | Status: DC

## 2017-09-09 MED ORDER — VITAMIN D3 1.25 MG (50000 UT) PO CAPS: 1.0000 | ORAL_CAPSULE | ORAL | 0 refills | Status: DC

## 2017-09-09 MED FILL — metFORMIN HCL 500 MG TABS: 500 | 30 days supply | Qty: 30 | Fill #0

## 2017-09-10 NOTE — Progress Notes (Signed)
Office: [phone]  /  Fax: [phone]   HPI:   Chief Complaint: OBESITY Wanda Peters is here to discuss her progress with her obesity treatment plan. She is on the Category 3 plan and is following her eating plan approximately 95 % of the time. She states she is exercising 0 minutes 0 times per week. Wanda Peters has done well with weight loss on the category 3 plan. She did note some afternoon hunger, which was her biggest struggle. Her daughter is 46 years old and is working on weight loss with the pediatric [facility] at [facility]. Her weight is 277 lb (125.6 kg) today and has had a weight loss of 4 pounds over a period of 2 weeks since her last visit. She has lost 4 lbs since starting treatment with [name].  Vitamin D deficiency Wanda Peters has a diagnosis of vitamin D deficiency. She is on prescription vit D and level has been improving but is not yet at [place]. Wanda Peters denies nausea, vomiting or muscle weakness.  Insulin Resistance Wanda Peters has a diagnosis of insulin resistance. She has normal glucose and A1c, but a mildly elevated fasting insulin level >5. Although Wanda Peters's blood glucose readings are still under good control, insulin resistance puts her at greater risk of metabolic syndrome and diabetes. She admits polyphagia, especially in the afternoons. She is doing well with her diet prescription. She is not taking metformin currently and continues to work on diet and exercise to decrease risk of diabetes.  At risk for diabetes Wanda Peters is at higher than average risk for developing diabetes due to her obesity and insulin resistance. She currently denies polyuria or polydipsia.   ALLERGIES: Allergies  Allergen Reactions  . Sulfonamide Derivatives     REACTION: rash    MEDICATIONS: Current Outpatient Medications on File Prior to Visit  Medication Sig Dispense Refill  . fexofenadine (ALLEGRA) 180 MG tablet Take 180 mg by mouth daily.    . fluticasone (FLONASE) 50 MCG/ACT nasal spray  Place 1 spray into both nostrils daily.     No current facility-administered medications on file prior to visit.     PAST MEDICAL HISTORY: Past Medical History:  Diagnosis Date  . Dry skin   . Fatigue   . Rupture of ovarian cyst   . Seasonal allergies   . Swelling of extremity   . Trouble in sleeping   . Vitamin D deficiency     PAST SURGICAL HISTORY: No past surgical history on file.  SOCIAL HISTORY: Social History   Tobacco Use  . Smoking status: Never Smoker  . Smokeless tobacco: Never Used  Substance Use Topics  . Alcohol use: Yes    Alcohol/week: 0.0 oz    Comment: socially  . Drug use: No    FAMILY HISTORY: Family History  Problem Relation Age of Onset  . Cancer Mother   . Obesity Father     ROS: Review of Systems  Constitutional: Positive for weight loss.  Gastrointestinal: Negative for nausea and vomiting.  Genitourinary: Negative for frequency.  Musculoskeletal:       Negative for muscle weakness  Endo/Heme/Allergies: Negative for polydipsia.       Positive for polyphagia    PHYSICAL EXAM: Blood pressure 118/76, pulse 69, temperature 97.9 F (36.6 C), temperature source Oral, height 5\' 8"  (1.727 m), weight 277 lb (125.6 kg), SpO2 97 %. Body mass index is 42.12 kg/m. Physical Exam  Constitutional: She is oriented to person, place, and time. She appears well-developed and well-nourished.  Cardiovascular: Normal rate.  Pulmonary/Chest: Effort normal.  Musculoskeletal: Normal range of motion.  Neurological: She is oriented to person, place, and time.  Skin: Skin is warm and dry.  Psychiatric: She has a normal mood and affect. Her behavior is normal.  Vitals reviewed.   RECENT LABS AND TESTS: BMET    Component Value Date/Time   NA 139 08/26/2017 0932   K 4.3 08/26/2017 0932   CL 104 08/26/2017 0932   CO2 22 08/26/2017 0932   GLUCOSE 81 08/26/2017 0932   GLUCOSE 95 06/25/2017 0854   BUN 13 08/26/2017 0932   CREATININE 0.74 08/26/2017  0932   CREATININE 0.63 12/01/2014 1532   CALCIUM 9.4 08/26/2017 0932   GFRNONAA 100 08/26/2017 0932   GFRAA 115 08/26/2017 0932   Lab Results  Component Value Date   HGBA1C 5.5 08/26/2017   HGBA1C 5.3 05/12/2008   Lab Results  Component Value Date   INSULIN 10.3 08/26/2017   CBC    Component Value Date/Time   WBC 5.9 07/14/2017 0905   RBC 4.19 07/14/2017 0905   HGB 13.4 07/14/2017 0905   HCT 39.5 07/14/2017 0905   PLT 227.0 07/14/2017 0905   MCV 94.3 07/14/2017 0905   MCH 31.2 12/01/2014 1532   MCHC 33.8 07/14/2017 0905   RDW 13.1 07/14/2017 0905   LYMPHSABS 1.8 07/14/2017 0905   MONOABS 0.4 07/14/2017 0905   EOSABS 0.1 07/14/2017 0905   BASOSABS 0.0 07/14/2017 0905   Iron/TIBC/Ferritin/ %Sat No results found for: IRON, TIBC, FERRITIN, IRONPCTSAT Lipid Panel     Component Value Date/Time   CHOL 180 08/26/2017 0932   TRIG 79 08/26/2017 0932   HDL 60 08/26/2017 0932   CHOLHDL 3 06/25/2017 0854   VLDL 10.8 06/25/2017 0854   LDLCALC 104 (H) 08/26/2017 0932   Hepatic Function Panel     Component Value Date/Time   PROT 6.9 08/26/2017 0932   ALBUMIN 4.4 08/26/2017 0932   AST 19 08/26/2017 0932   ALT 19 08/26/2017 0932   ALKPHOS 111 08/26/2017 0932   BILITOT 0.2 08/26/2017 0932   BILIDIR 0.1 05/12/2008 0942      Component Value Date/Time   TSH 4.03 07/14/2017 0905   TSH 1.688 12/01/2014 1532   TSH 1.93 05/12/2008 0942   Results for FANTASIA, JINKINS (MRN [id number]) as of 09/10/2017 08:26  Ref. Range 08/26/2017 09:32  Vitamin D, 25-Hydroxy Latest Ref Range: 30.0 - 100.0 ng/mL 35.9   ASSESSMENT AND PLAN: Vitamin D deficiency - Plan: Cholecalciferol (VITAMIN D3) 50000 units CAPS  Insulin resistance - Plan: metFORMIN (GLUCOPHAGE) 500 MG tablet  At risk for diabetes mellitus  Class 3 severe obesity with serious comorbidity and body mass index (BMI) of 40.0 to 44.9 in adult, unspecified obesity type (Wanda Peters)  PLAN:  Vitamin D Deficiency Wanda Peters was  informed that low vitamin D levels contributes to fatigue and are associated with obesity, breast, and colon cancer. She agrees to continue to take prescription Vit D @50 ,000 IU every week #4 with no refills and will follow up for routine testing of vitamin D, at least 2-3 times per year. She was informed of the risk of over-replacement of vitamin D and agrees to not increase her dose unless she discusses this with [name] first. Wanda Peters agrees to follow up with our clinic in 2 weeks.  Insulin Resistance Wanda Peters will continue to work on weight loss, exercise, and decreasing simple carbohydrates in her diet to help decrease the risk of diabetes. We dicussed metformin including benefits and risks. She was  informed that eating too many simple carbohydrates or too many calories at one sitting increases the likelihood of GI side effects. Wanda Peters agreed to start metformin 500 mg qd #30 with no refills. Wanda Peters agreed to follow up with [name] as directed to monitor her progress.  Diabetes risk counseling Wanda Peters was given extended (30 minutes) diabetes prevention counseling today. She is 42 y.o. female and has risk factors for diabetes including obesity and insulin resistance. We discussed intensive lifestyle modifications today with an emphasis on weight loss as well as increasing exercise and decreasing simple carbohydrates in her diet.  Obesity Wanda Peters is currently in the action stage of change. As such, her goal is to continue with weight loss efforts She has agreed to follow the Category 3 plan Wanda Peters has been instructed to work up to a goal of 150 minutes of combined cardio and strengthening exercise per week for weight loss and overall health benefits. We discussed the following Behavioral Modification Strategies today: increasing lean protein intake, decreasing simple carbohydrates  and work on meal planning and easy cooking plans  Wanda Peters has agreed to follow up with our clinic in 2 weeks.  She was informed of the importance of frequent follow up visits to maximize her success with intensive lifestyle modifications for her multiple health conditions.   OBESITY BEHAVIORAL INTERVENTION VISIT  Today's visit was # 2 out of 22.  Starting weight: 281 lbs Starting date: 08/26/17 Today's weight : 277 lbs Today's date: 09/09/2017 Total lbs lost to date: 4 (Patients must lose 7 lbs in the first 6 months to continue with counseling)   ASK: We discussed the diagnosis of obesity with Wanda Peters today and Wanda Peters agreed to give [name] permission to discuss obesity behavioral modification therapy today.  ASSESS: Valeska has the diagnosis of obesity and her BMI today is 42.13 Wanda Peters is in the action stage of change   ADVISE: Wanda Peters was educated on the multiple health risks of obesity as well as the benefit of weight loss to improve her health. She was advised of the need for long term treatment and the importance of lifestyle modifications.  AGREE: Multiple dietary modification options and treatment options were discussed and  Jamelle agreed to the above obesity treatment plan.  I, Doreene Nest, am acting as transcriptionist for Dennard Nip, MD  I have reviewed the above documentation for accuracy and completeness, and I agree with the above. -Dennard Nip, MD

## 2017-09-24 ENCOUNTER — Ambulatory Visit (INDEPENDENT_AMBULATORY_CARE_PROVIDER_SITE_OTHER): Payer: 59 | Admitting: Family Medicine

## 2017-09-24 VITALS — BP 111/74 | HR 59 | Temp 98.2°F | Ht 68.0 in | Wt 276.0 lb

## 2017-09-24 DIAGNOSIS — Z9189 Other specified personal risk factors, not elsewhere classified: Secondary | ICD-10-CM

## 2017-09-24 DIAGNOSIS — E8881 Metabolic syndrome: Secondary | ICD-10-CM | POA: Diagnosis not present

## 2017-09-24 DIAGNOSIS — Z6841 Body Mass Index (BMI) 40.0 and over, adult: Secondary | ICD-10-CM | POA: Diagnosis not present

## 2017-09-24 MED ORDER — METFORMIN HCL 500 MG PO TABS: 500.0000 mg | ORAL_TABLET | Freq: Every day | ORAL | 0 refills | Status: DC

## 2017-09-24 NOTE — Progress Notes (Signed)
Office: [phone]  /  Fax: [phone]   HPI:   Chief Complaint: OBESITY Jalaiyah is here to discuss her progress with her obesity treatment plan. She is on the Category 3 plan and is following her eating plan approximately 95 % of the time. She states she is exercising 0 minutes 0 times per week. Kellan is getting ready to go on a cruise and has questions about how to eat while on vacation.  Her weight is 276 lb (125.2 kg) today and has had a weight loss of 1 pound over a period of 2 weeks since her last visit. She has lost 5 lbs since starting treatment with [name].  Insulin Resistance Inessa has a diagnosis of insulin resistance based on her elevated fasting insulin level >5. Although Kaylamarie's blood glucose readings are still under good control, insulin resistance puts her at greater risk of metabolic syndrome and diabetes. She is doing well on metformin, she denies nausea, vomiting, or hypoglycemia, and she notes decreased polyphagia. She continues to work on diet and exercise to decrease risk of diabetes.  At risk for diabetes Iriel is at higher than average risk for developing diabetes due to her obesity and insulin resistance. She currently denies polyuria or polydipsia.  ALLERGIES: Allergies  Allergen Reactions  . Sulfonamide Derivatives     REACTION: rash    MEDICATIONS: Current Outpatient Medications on File Prior to Visit  Medication Sig Dispense Refill  . Cholecalciferol (VITAMIN D3) 50000 units CAPS Take 1 capsule by mouth every 7 (seven) days. 12 capsule 0  . fexofenadine (ALLEGRA) 180 MG tablet Take 180 mg by mouth daily.    . fluticasone (FLONASE) 50 MCG/ACT nasal spray Place 1 spray into both nostrils daily.     No current facility-administered medications on file prior to visit.     PAST MEDICAL HISTORY: Past Medical History:  Diagnosis Date  . Dry skin   . Fatigue   . Rupture of ovarian cyst   . Seasonal allergies   . Swelling of extremity    . Trouble in sleeping   . Vitamin D deficiency     PAST SURGICAL HISTORY: No past surgical history on file.  SOCIAL HISTORY: Social History   Tobacco Use  . Smoking status: Never Smoker  . Smokeless tobacco: Never Used  Substance Use Topics  . Alcohol use: Yes    Alcohol/week: 0.0 oz    Comment: socially  . Drug use: No    FAMILY HISTORY: Family History  Problem Relation Age of Onset  . Cancer Mother   . Obesity Father     ROS: Review of Systems  Constitutional: Positive for weight loss.  Gastrointestinal: Negative for nausea and vomiting.  Genitourinary: Negative for frequency.  Endo/Heme/Allergies: Negative for polydipsia.       Positive polyphagia Negative hypoglycemia    PHYSICAL EXAM: Blood pressure 111/74, pulse (!) 59, temperature 98.2 F (36.8 C), temperature source Oral, height 5\' 8"  (1.727 m), weight 276 lb (125.2 kg), last menstrual period 09/16/2017, SpO2 99 %. Body mass index is 41.97 kg/m. Physical Exam  Constitutional: She is oriented to person, place, and time. She appears well-developed and well-nourished.  Cardiovascular: Normal rate.  Pulmonary/Chest: Effort normal.  Musculoskeletal: Normal range of motion.  Neurological: She is oriented to person, place, and time.  Skin: Skin is warm and dry.  Psychiatric: She has a normal mood and affect. Her behavior is normal.  Vitals reviewed.   RECENT LABS AND TESTS: BMET    Component  Value Date/Time   NA 139 08/26/2017 0932   K 4.3 08/26/2017 0932   CL 104 08/26/2017 0932   CO2 22 08/26/2017 0932   GLUCOSE 81 08/26/2017 0932   GLUCOSE 95 06/25/2017 0854   BUN 13 08/26/2017 0932   CREATININE 0.74 08/26/2017 0932   CREATININE 0.63 12/01/2014 1532   CALCIUM 9.4 08/26/2017 0932   GFRNONAA 100 08/26/2017 0932   GFRAA 115 08/26/2017 0932   Lab Results  Component Value Date   HGBA1C 5.5 08/26/2017   HGBA1C 5.3 05/12/2008   Lab Results  Component Value Date   INSULIN 10.3 08/26/2017    CBC    Component Value Date/Time   WBC 5.9 07/14/2017 0905   RBC 4.19 07/14/2017 0905   HGB 13.4 07/14/2017 0905   HCT 39.5 07/14/2017 0905   PLT 227.0 07/14/2017 0905   MCV 94.3 07/14/2017 0905   MCH 31.2 12/01/2014 1532   MCHC 33.8 07/14/2017 0905   RDW 13.1 07/14/2017 0905   LYMPHSABS 1.8 07/14/2017 0905   MONOABS 0.4 07/14/2017 0905   EOSABS 0.1 07/14/2017 0905   BASOSABS 0.0 07/14/2017 0905   Iron/TIBC/Ferritin/ %Sat No results found for: IRON, TIBC, FERRITIN, IRONPCTSAT Lipid Panel     Component Value Date/Time   CHOL 180 08/26/2017 0932   TRIG 79 08/26/2017 0932   HDL 60 08/26/2017 0932   CHOLHDL 3 06/25/2017 0854   VLDL 10.8 06/25/2017 0854   LDLCALC 104 (H) 08/26/2017 0932   Hepatic Function Panel     Component Value Date/Time   PROT 6.9 08/26/2017 0932   ALBUMIN 4.4 08/26/2017 0932   AST 19 08/26/2017 0932   ALT 19 08/26/2017 0932   ALKPHOS 111 08/26/2017 0932   BILITOT 0.2 08/26/2017 0932   BILIDIR 0.1 05/12/2008 0942      Component Value Date/Time   TSH 4.03 07/14/2017 0905   TSH 1.688 12/01/2014 1532   TSH 1.93 05/12/2008 0942    ASSESSMENT AND PLAN: Insulin resistance - Plan: metFORMIN (GLUCOPHAGE) 500 MG tablet  At risk for diabetes mellitus  Class 3 severe obesity with serious comorbidity and body mass index (BMI) of 40.0 to 44.9 in adult, unspecified obesity type ([place])  PLAN:  Insulin Resistance Vy will continue to work on weight loss, exercise, and decreasing simple carbohydrates in her diet to help decrease the risk of diabetes. We dicussed metformin including benefits and risks. She was informed that eating too many simple carbohydrates or too many calories at one sitting increases the likelihood of GI side effects. Harleyquinn agrees to continue taking metformin 500 mg q AM #30 and we will refill for 1 month. Agripina agrees to follow up with our clinic in 3 weeks as directed to monitor her progress.  Diabetes risk  counselling Ryiah was given extended (15 minutes) diabetes prevention counseling today. She is 42 y.o. female and has risk factors for diabetes including obesity and insulin resistance. We discussed intensive lifestyle modifications today with an emphasis on weight loss as well as increasing exercise and decreasing simple carbohydrates in her diet.  Obesity Tannie is currently in the action stage of change. As such, her goal is to continue with weight loss efforts She has agreed to follow the Category 3 plan Darnell has been instructed to work up to a goal of 150 minutes of combined cardio and strengthening exercise per week for weight loss and overall health benefits. We discussed the following Behavioral Modification Strategies today: increasing lean protein intake, travel eating strategies, and celebration eating  strategies   Alencia has agreed to follow up with our clinic in 3 weeks. She was informed of the importance of frequent follow up visits to maximize her success with intensive lifestyle modifications for her multiple health conditions.   OBESITY BEHAVIORAL INTERVENTION VISIT  Today's visit was # 3 out of 22.  Starting weight: 281 lbs Starting date: 08/26/17 Today's weight : 276 lbs Today's date: 09/24/2017 Total lbs lost to date: 5 (Patients must lose 7 lbs in the first 6 months to continue with counseling)   ASK: We discussed the diagnosis of obesity with [name] today and [place] agreed to give [name] permission to discuss obesity behavioral modification therapy today.  ASSESS: Jacquline has the diagnosis of obesity and her BMI today is 41.98 Vernell is in the action stage of change   ADVISE: Shavonn was educated on the multiple health risks of obesity as well as the benefit of weight loss to improve her health. She was advised of the need for long term treatment and the importance of lifestyle modifications.  AGREE: Multiple dietary  modification options and treatment options were discussed and  Donae agreed to the above obesity treatment plan.  I, Trixie Dredge, am acting as transcriptionist for Dennard Nip, MD  I have reviewed the above documentation for accuracy and completeness, and I agree with the above. -Dennard Nip, MD

## 2017-10-15 ENCOUNTER — Ambulatory Visit (INDEPENDENT_AMBULATORY_CARE_PROVIDER_SITE_OTHER): Payer: 59 | Admitting: Family Medicine

## 2017-10-15 VITALS — BP 108/72 | HR 65 | Temp 97.8°F | Ht 68.0 in | Wt 272.0 lb

## 2017-10-15 DIAGNOSIS — Z9189 Other specified personal risk factors, not elsewhere classified: Secondary | ICD-10-CM

## 2017-10-15 DIAGNOSIS — E8881 Metabolic syndrome: Secondary | ICD-10-CM

## 2017-10-15 DIAGNOSIS — E88819 Insulin resistance, unspecified: Secondary | ICD-10-CM

## 2017-10-15 DIAGNOSIS — Z6841 Body Mass Index (BMI) 40.0 and over, adult: Secondary | ICD-10-CM | POA: Diagnosis not present

## 2017-10-15 MED ORDER — METFORMIN HCL 500 MG PO TABS: 500.0000 mg | ORAL_TABLET | Freq: Every day | ORAL | 0 refills | Status: DC

## 2017-10-15 NOTE — Progress Notes (Signed)
Office: [phone]  /  Fax: [phone]   HPI:   Chief Complaint: OBESITY Wanda Peters is here to discuss her progress with her obesity treatment plan. She is on the Category 3 plan and is following her eating plan approximately 60 % of the time. She states she is exercising 0 minutes 0 times per week. Wanda Peters continues to do well with weight loss even on vacation on a cruise. She did make smarter choices.  Her weight is 272 lb (123.4 kg) today and has had a weight loss of 4 pounds over a period of 3 weeks since her last visit. She has lost 9 lbs since starting treatment with [name].  Insulin Resistance Wanda Peters has a diagnosis of insulin resistance based on her elevated fasting insulin level >5. Although Wanda Peters's blood glucose readings are still under good control, insulin resistance puts her at greater risk of metabolic syndrome and diabetes. She is well controlled on metformin and diet and she denies nausea, vomiting, or hypoglycemia. She continues to work on diet and exercise to decrease risk of diabetes.  At risk for diabetes Wanda Peters is at higher than average risk for developing diabetes due to her obesity and insulin resistance. She currently denies polyuria or polydipsia.  ALLERGIES: Allergies  Allergen Reactions  . Sulfonamide Derivatives     REACTION: rash    MEDICATIONS: Current Outpatient Medications on File Prior to Visit  Medication Sig Dispense Refill  . Cholecalciferol (VITAMIN D3) 50000 units CAPS Take 1 capsule by mouth every 7 (seven) days. 12 capsule 0  . fexofenadine (ALLEGRA) 180 MG tablet Take 180 mg by mouth daily.    . fluticasone (FLONASE) 50 MCG/ACT nasal spray Place 1 spray into both nostrils daily.     No current facility-administered medications on file prior to visit.     PAST MEDICAL HISTORY: Past Medical History:  Diagnosis Date  . Dry skin   . Fatigue   . Rupture of ovarian cyst   . Seasonal allergies   . Swelling of extremity   .  Trouble in sleeping   . Vitamin D deficiency     PAST SURGICAL HISTORY: No past surgical history on file.  SOCIAL HISTORY: Social History   Tobacco Use  . Smoking status: Never Smoker  . Smokeless tobacco: Never Used  Substance Use Topics  . Alcohol use: Yes    Alcohol/week: 0.0 oz    Comment: socially  . Drug use: No    FAMILY HISTORY: Family History  Problem Relation Age of Onset  . Cancer Mother   . Obesity Father     ROS: Review of Systems  Constitutional: Positive for weight loss.  Gastrointestinal: Negative for nausea and vomiting.  Genitourinary: Negative for frequency.  Endo/Heme/Allergies: Negative for polydipsia.       Negative hypoglycemia    PHYSICAL EXAM: Blood pressure 108/72, pulse 65, temperature 97.8 F (36.6 C), temperature source Oral, height 5\' 8"  (1.727 m), weight 272 lb (123.4 kg), last menstrual period 09/16/2017, SpO2 98 %. Body mass index is 41.36 kg/m. Physical Exam  Constitutional: She is oriented to person, place, and time. She appears well-developed and well-nourished.  Cardiovascular: Normal rate.  Pulmonary/Chest: Effort normal.  Musculoskeletal: Normal range of motion.  Neurological: She is oriented to person, place, and time.  Skin: Skin is warm and dry.  Psychiatric: She has a normal mood and affect. Her behavior is normal.  Vitals reviewed.   RECENT LABS AND TESTS: BMET    Component Value Date/Time   NA  139 08/26/2017 0932   K 4.3 08/26/2017 0932   CL 104 08/26/2017 0932   CO2 22 08/26/2017 0932   GLUCOSE 81 08/26/2017 0932   GLUCOSE 95 06/25/2017 0854   BUN 13 08/26/2017 0932   CREATININE 0.74 08/26/2017 0932   CREATININE 0.63 12/01/2014 1532   CALCIUM 9.4 08/26/2017 0932   GFRNONAA 100 08/26/2017 0932   GFRAA 115 08/26/2017 0932   Lab Results  Component Value Date   HGBA1C 5.5 08/26/2017   HGBA1C 5.3 05/12/2008   Lab Results  Component Value Date   INSULIN 10.3 08/26/2017   CBC    Component Value  Date/Time   WBC 5.9 07/14/2017 0905   RBC 4.19 07/14/2017 0905   HGB 13.4 07/14/2017 0905   HCT 39.5 07/14/2017 0905   PLT 227.0 07/14/2017 0905   MCV 94.3 07/14/2017 0905   MCH 31.2 12/01/2014 1532   MCHC 33.8 07/14/2017 0905   RDW 13.1 07/14/2017 0905   LYMPHSABS 1.8 07/14/2017 0905   MONOABS 0.4 07/14/2017 0905   EOSABS 0.1 07/14/2017 0905   BASOSABS 0.0 07/14/2017 0905   Iron/TIBC/Ferritin/ %Sat No results found for: IRON, TIBC, FERRITIN, IRONPCTSAT Lipid Panel     Component Value Date/Time   CHOL 180 08/26/2017 0932   TRIG 79 08/26/2017 0932   HDL 60 08/26/2017 0932   CHOLHDL 3 06/25/2017 0854   VLDL 10.8 06/25/2017 0854   LDLCALC 104 (H) 08/26/2017 0932   Hepatic Function Panel     Component Value Date/Time   PROT 6.9 08/26/2017 0932   ALBUMIN 4.4 08/26/2017 0932   AST 19 08/26/2017 0932   ALT 19 08/26/2017 0932   ALKPHOS 111 08/26/2017 0932   BILITOT 0.2 08/26/2017 0932   BILIDIR 0.1 05/12/2008 0942      Component Value Date/Time   TSH 4.03 07/14/2017 0905   TSH 1.688 12/01/2014 1532   TSH 1.93 05/12/2008 0942    ASSESSMENT AND PLAN: Insulin resistance - Plan: metFORMIN (GLUCOPHAGE) 500 MG tablet  At risk for diabetes mellitus  Class 3 severe obesity with serious comorbidity and body mass index (BMI) of 40.0 to 44.9 in adult, unspecified obesity type (Wanda Peters)  PLAN:  Insulin Resistance Wanda Peters will continue to work on weight loss, exercise, and decreasing simple carbohydrates in her diet to help decrease the risk of diabetes. We dicussed metformin including benefits and risks. She was informed that eating too many simple carbohydrates or too many calories at one sitting increases the likelihood of GI side effects. Wanda Peters agrees to continue taking metformin 500 mg q AM #30 and we will refill for 1 month. Wanda Peters agrees to follow up with our clinic in 3 weeks as directed to monitor her progress.  Diabetes risk counselling Wanda Peters was given extended  (15 minutes) diabetes prevention counseling today. She is 42 y.o. female and has risk factors for diabetes including obesity and insulin resistance. We discussed intensive lifestyle modifications today with an emphasis on weight loss as well as increasing exercise and decreasing simple carbohydrates in her diet.  Obesity Wanda Peters is currently in the action stage of change. As such, her goal is to continue with weight loss efforts She has agreed to follow the Category 3 plan Wanda Peters has been instructed to work up to a goal of 150 minutes of combined cardio and strengthening exercise per week for weight loss and overall health benefits. We discussed the following Behavioral Modification Strategies today: increasing lean protein intake, decreasing simple carbohydrates, work on meal planning and easy cooking plans  and holiday eating strategies    Wanda Peters has agreed to follow up with our clinic in 3 weeks. She was informed of the importance of frequent follow up visits to maximize her success with intensive lifestyle modifications for her multiple health conditions.   OBESITY BEHAVIORAL INTERVENTION VISIT  Today's visit was # 4 out of 22.  Starting weight: 281 lbs Starting date: 08/26/17 Today's weight : 272 lbs Today's date: 10/15/2017 Total lbs lost to date: 9 (Patients must lose 7 lbs in the first 6 months to continue with counseling)   ASK: We discussed the diagnosis of obesity with Wanda Peters today and Wanda Peters agreed to give [name] permission to discuss obesity behavioral modification therapy today.  ASSESS: Wanda Peters has the diagnosis of obesity and her BMI today is 41.37 Wanda Peters is in the action stage of change   ADVISE: Wanda Peters was educated on the multiple health risks of obesity as well as the benefit of weight loss to improve her health. She was advised of the need for long term treatment and the importance of lifestyle modifications.  AGREE: Multiple dietary  modification options and treatment options were discussed and  Wanda Peters agreed to the above obesity treatment plan.  I, Trixie Dredge, am acting as transcriptionist for Dennard Nip, MD  I have reviewed the above documentation for accuracy and completeness, and I agree with the above. -Dennard Nip, MD

## 2017-11-05 ENCOUNTER — Ambulatory Visit (INDEPENDENT_AMBULATORY_CARE_PROVIDER_SITE_OTHER): Payer: 59 | Admitting: Family Medicine

## 2017-11-05 VITALS — BP 103/76 | HR 56 | Temp 97.6°F | Ht 68.0 in | Wt 274.0 lb

## 2017-11-05 DIAGNOSIS — E8881 Metabolic syndrome: Secondary | ICD-10-CM

## 2017-11-05 DIAGNOSIS — E88819 Insulin resistance, unspecified: Secondary | ICD-10-CM

## 2017-11-05 DIAGNOSIS — Z6841 Body Mass Index (BMI) 40.0 and over, adult: Secondary | ICD-10-CM

## 2017-11-05 DIAGNOSIS — Z9189 Other specified personal risk factors, not elsewhere classified: Secondary | ICD-10-CM | POA: Diagnosis not present

## 2017-11-05 DIAGNOSIS — E66813 Obesity, class 3: Secondary | ICD-10-CM

## 2017-11-05 MED ORDER — METFORMIN HCL 500 MG PO TABS: 500.0000 mg | ORAL_TABLET | Freq: Two times a day (BID) | ORAL | 0 refills | Status: DC

## 2017-11-05 MED FILL — VITAMIN D3 50000 UNIT CAPS: 1.25 MG | 84 days supply | Qty: 12 | Fill #0

## 2017-11-05 MED FILL — metFORMIN HCL 500 MG TABS: 500 | 30 days supply | Qty: 60 | Fill #0

## 2017-11-05 NOTE — Progress Notes (Signed)
Office: [phone]  /  Fax: [phone]   HPI:   Chief Complaint: OBESITY Wanda Peters is here to discuss her progress with her obesity treatment plan. She is on the Category 3 plan and is following her eating plan approximately 75 % of the time. She states she is exercising 0 minutes 0 times per week. Wanda Peters had some increase celebration eating and traveling and eating out. Her 2 lbs weight gain appears to be mostly water weight. She is traveling to [place] with daughter soon and would like diet advice while traveling.  Her weight is 274 lb (124.3 kg) today and has gained 2 pounds since her last visit. She has lost 7 lbs since starting treatment with [name].  Insulin Resistance Wanda Peters has a diagnosis of insulin resistance based on her elevated fasting insulin level >5. Although Wanda Peters's blood glucose readings are still under good control, insulin resistance puts her at greater risk of metabolic syndrome and diabetes. She notes decreased polyphagia on metformin but still struggling later in the day. She denies nausea, vomiting, or hypoglycemia. She continues to work on diet and exercise to decrease risk of diabetes.  At risk for diabetes Wanda Peters is at higher than average risk for developing diabetes due to her obesity and insulin resistance. She currently denies polyuria or polydipsia.  ALLERGIES: Allergies  Allergen Reactions  . Sulfonamide Derivatives     REACTION: rash    MEDICATIONS: Current Outpatient Medications on File Prior to Visit  Medication Sig Dispense Refill  . Cholecalciferol (VITAMIN D3) 50000 units CAPS Take 1 capsule by mouth every 7 (seven) days. 12 capsule 0  . fexofenadine (ALLEGRA) 180 MG tablet Take 180 mg by mouth daily.    . fluticasone (FLONASE) 50 MCG/ACT nasal spray Place 1 spray into both nostrils daily.     No current facility-administered medications on file prior to visit.     PAST MEDICAL HISTORY: Past Medical History:  Diagnosis  Date  . Dry skin   . Fatigue   . Rupture of ovarian cyst   . Seasonal allergies   . Swelling of extremity   . Trouble in sleeping   . Vitamin D deficiency     PAST SURGICAL HISTORY: No past surgical history on file.  SOCIAL HISTORY: Social History   Tobacco Use  . Smoking status: Never Smoker  . Smokeless tobacco: Never Used  Substance Use Topics  . Alcohol use: Yes    Alcohol/week: 0.0 oz    Comment: socially  . Drug use: No    FAMILY HISTORY: Family History  Problem Relation Age of Onset  . Cancer Mother   . Obesity Father     ROS: Review of Systems  Constitutional: Negative for weight loss.  Gastrointestinal: Negative for nausea and vomiting.  Genitourinary: Negative for frequency.  Endo/Heme/Allergies: Negative for polydipsia.       Positive polyphagia Negative hypoglycemia    PHYSICAL EXAM: Blood pressure 103/76, pulse (!) 56, temperature 97.6 F (36.4 C), temperature source Oral, height 5\' 8"  (1.727 m), weight 274 lb (124.3 kg), last menstrual period 10/11/2017, SpO2 99 %. Body mass index is 41.66 kg/m. Physical Exam  Constitutional: She is oriented to person, place, and time. She appears well-developed and well-nourished.  Cardiovascular: Normal rate.  Pulmonary/Chest: Effort normal.  Musculoskeletal: Normal range of motion.  Neurological: She is oriented to person, place, and time.  Skin: Skin is warm and dry.  Psychiatric: She has a normal mood and affect. Her behavior is normal.  Vitals  reviewed.   RECENT LABS AND TESTS: BMET    Component Value Date/Time   NA 139 08/26/2017 0932   K 4.3 08/26/2017 0932   CL 104 08/26/2017 0932   CO2 22 08/26/2017 0932   GLUCOSE 81 08/26/2017 0932   GLUCOSE 95 06/25/2017 0854   BUN 13 08/26/2017 0932   CREATININE 0.74 08/26/2017 0932   CREATININE 0.63 12/01/2014 1532   CALCIUM 9.4 08/26/2017 0932   GFRNONAA 100 08/26/2017 0932   GFRAA 115 08/26/2017 0932   Lab Results  Component Value Date    HGBA1C 5.5 08/26/2017   HGBA1C 5.3 05/12/2008   Lab Results  Component Value Date   INSULIN 10.3 08/26/2017   CBC    Component Value Date/Time   WBC 5.9 07/14/2017 0905   RBC 4.19 07/14/2017 0905   HGB 13.4 07/14/2017 0905   HCT 39.5 07/14/2017 0905   PLT 227.0 07/14/2017 0905   MCV 94.3 07/14/2017 0905   MCH 31.2 12/01/2014 1532   MCHC 33.8 07/14/2017 0905   RDW 13.1 07/14/2017 0905   LYMPHSABS 1.8 07/14/2017 0905   MONOABS 0.4 07/14/2017 0905   EOSABS 0.1 07/14/2017 0905   BASOSABS 0.0 07/14/2017 0905   Iron/TIBC/Ferritin/ %Sat No results found for: IRON, TIBC, FERRITIN, IRONPCTSAT Lipid Panel     Component Value Date/Time   CHOL 180 08/26/2017 0932   TRIG 79 08/26/2017 0932   HDL 60 08/26/2017 0932   CHOLHDL 3 06/25/2017 0854   VLDL 10.8 06/25/2017 0854   LDLCALC 104 (H) 08/26/2017 0932   Hepatic Function Panel     Component Value Date/Time   PROT 6.9 08/26/2017 0932   ALBUMIN 4.4 08/26/2017 0932   AST 19 08/26/2017 0932   ALT 19 08/26/2017 0932   ALKPHOS 111 08/26/2017 0932   BILITOT 0.2 08/26/2017 0932   BILIDIR 0.1 05/12/2008 0942      Component Value Date/Time   TSH 4.03 07/14/2017 0905   TSH 1.688 12/01/2014 1532   TSH 1.93 05/12/2008 0942    ASSESSMENT AND PLAN: Insulin resistance - Plan: metFORMIN (GLUCOPHAGE) 500 MG tablet  At risk for diabetes mellitus  Class 3 severe obesity with serious comorbidity and body mass index (BMI) of 40.0 to 44.9 in adult, unspecified obesity type ([place])  PLAN:  Insulin Resistance Wanda Peters will continue to work on weight loss, diet, exercise, and decreasing simple carbohydrates in her diet to help decrease the risk of diabetes. We dicussed metformin including benefits and risks. She was informed that eating too many simple carbohydrates or too many calories at one sitting increases the likelihood of GI side effects. Calea agrees to increase metformin to 500 mg BID #60 with no refills. Wanda Peters agrees to  follow up with our clinic in  2 to 3 weeks as directed to monitor her progress.  Diabetes risk counselling Wanda Peters was given extended (15 minutes) diabetes prevention counseling today. She is 42 y.o. female and has risk factors for diabetes including obesity and insulin resistance. We discussed intensive lifestyle modifications today with an emphasis on weight loss as well as increasing exercise and decreasing simple carbohydrates in her diet.  Obesity Precious is currently in the action stage of change. As such, her goal is to continue with weight loss efforts She has agreed to follow the Category 3 plan Sibyl has been instructed to work up to a goal of 150 minutes of combined cardio and strengthening exercise per week for weight loss and overall health benefits. We discussed the following Behavioral Modification Strategies  today: increasing lean protein intake, decreasing simple carbohydrates  and travel eating strategies    Kidada has agreed to follow up with our clinic in 2 to 3 weeks. She was informed of the importance of frequent follow up visits to maximize her success with intensive lifestyle modifications for her multiple health conditions.   OBESITY BEHAVIORAL INTERVENTION VISIT  Today's visit was # 5 out of 22.  Starting weight: 281 lbs Starting date: 08/26/17 Today's weight : 274 lbs  Today's date: 11/05/2017 Total lbs lost to date: 7 (Patients must lose 7 lbs in the first 6 months to continue with counseling)   ASK: We discussed the diagnosis of obesity with [name] today and Quinta agreed to give [name] permission to discuss obesity behavioral modification therapy today.  ASSESS: Mckaylie has the diagnosis of obesity and her BMI today is 41.67 Lindsy is in the action stage of change   ADVISE: Malajah was educated on the multiple health risks of obesity as well as the benefit of weight loss to improve her health. She was advised of the need  for long term treatment and the importance of lifestyle modifications.  AGREE: Multiple dietary modification options and treatment options were discussed and  Leeah agreed to the above obesity treatment plan.  I, Trixie Dredge, am acting as transcriptionist for Dennard Nip, MD  I have reviewed the above documentation for accuracy and completeness, and I agree with the above. -Dennard Nip, MD

## 2017-11-25 ENCOUNTER — Ambulatory Visit (INDEPENDENT_AMBULATORY_CARE_PROVIDER_SITE_OTHER): Payer: 59 | Admitting: Family Medicine

## 2017-11-25 VITALS — BP 104/69 | HR 63 | Temp 98.0°F | Ht 68.0 in | Wt 273.0 lb

## 2017-11-25 DIAGNOSIS — E8881 Metabolic syndrome: Secondary | ICD-10-CM

## 2017-11-25 DIAGNOSIS — E88819 Insulin resistance, unspecified: Secondary | ICD-10-CM

## 2017-11-25 DIAGNOSIS — E66813 Obesity, class 3: Secondary | ICD-10-CM

## 2017-11-25 DIAGNOSIS — Z6841 Body Mass Index (BMI) 40.0 and over, adult: Secondary | ICD-10-CM | POA: Diagnosis not present

## 2017-11-25 NOTE — Progress Notes (Signed)
Office: [phone]  /  Fax: [phone]   HPI:   Chief Complaint: OBESITY Wanda Peters is here to discuss her progress with her obesity treatment plan. She is on the Category 3 plan and is following her eating plan approximately 50 % of the time. She states she is exercising 0 minutes 0 times per week. Wanda Peters continues to do well with weight loss even while on vacation in [place]. She Peters had increased celebration eating with work events but she tried to be mindful. She did lots of walking while on vacation.  Her weight is 273 lb (123.8 kg) today and Peters had a weight loss of 1 pound over a period of 3 weeks since her last visit. She Peters lost 8 lbs since starting treatment with [name].  Insulin Resistance Wanda Peters Peters a diagnosis of insulin resistance based on her elevated fasting insulin level >5. Although Wanda Peters blood glucose readings are still under good control, insulin resistance puts her at greater risk of metabolic syndrome and diabetes. She continues to do well with weight loss. She is on metformin BID and notes feeling better when she spaces her 2nd dose out further.  ALLERGIES: Allergies  Allergen Reactions  . Sulfonamide Derivatives     REACTION: rash    MEDICATIONS: Current Outpatient Medications on File Prior to Visit  Medication Sig Dispense Refill  . Cholecalciferol (VITAMIN D3) 50000 units CAPS Take 1 capsule by mouth every 7 (seven) days. 12 capsule 0  . fexofenadine (ALLEGRA) 180 MG tablet Take 180 mg by mouth daily.    . fluticasone (FLONASE) 50 MCG/ACT nasal spray Place 1 spray into both nostrils daily.    . metFORMIN (GLUCOPHAGE) 500 MG tablet Take 1 tablet (500 mg total) by mouth 2 (two) times daily with a meal. 60 tablet 0   No current facility-administered medications on file prior to visit.     PAST MEDICAL HISTORY: Past Medical History:  Diagnosis Date  . Dry skin   . Fatigue   . Rupture of ovarian cyst   . Seasonal allergies   . Swelling  of extremity   . Trouble in sleeping   . Vitamin D deficiency     PAST SURGICAL HISTORY: No past surgical history on file.  SOCIAL HISTORY: Social History   Tobacco Use  . Smoking status: Never Smoker  . Smokeless tobacco: Never Used  Substance Use Topics  . Alcohol use: Yes    Alcohol/week: 0.0 oz    Comment: socially  . Drug use: No    FAMILY HISTORY: Family History  Problem Relation Age of Onset  . Cancer Mother   . Obesity Father     ROS: Review of Systems  Constitutional: Positive for weight loss.    PHYSICAL EXAM: Blood pressure 104/69, pulse 63, temperature 98 F (36.7 C), temperature source Oral, height 5\' 8"  (1.727 m), weight 273 lb (123.8 kg), last menstrual period 11/14/2017, SpO2 98 %. Body mass index is 41.51 kg/m. Physical Exam  Constitutional: She is oriented to person, place, and time. She appears well-developed and well-nourished.  Cardiovascular: Normal rate.  Pulmonary/Chest: Effort normal.  Musculoskeletal: Normal range of motion.  Neurological: She is oriented to person, place, and time.  Skin: Skin is warm and dry.  Psychiatric: She Peters a normal mood and affect. Her behavior is normal.  Vitals reviewed.   RECENT LABS AND TESTS: BMET    Component Value Date/Time   NA 139 08/26/2017 0932   K 4.3 08/26/2017 0932  CL 104 08/26/2017 0932   CO2 22 08/26/2017 0932   GLUCOSE 81 08/26/2017 0932   GLUCOSE 95 06/25/2017 0854   BUN 13 08/26/2017 0932   CREATININE 0.74 08/26/2017 0932   CREATININE 0.63 12/01/2014 1532   CALCIUM 9.4 08/26/2017 0932   GFRNONAA 100 08/26/2017 0932   GFRAA 115 08/26/2017 0932   Lab Results  Component Value Date   HGBA1C 5.5 08/26/2017   HGBA1C 5.3 05/12/2008   Lab Results  Component Value Date   INSULIN 10.3 08/26/2017   CBC    Component Value Date/Time   WBC 5.9 07/14/2017 0905   RBC 4.19 07/14/2017 0905   HGB 13.4 07/14/2017 0905   HCT 39.5 07/14/2017 0905   PLT 227.0 07/14/2017 0905   MCV  94.3 07/14/2017 0905   MCH 31.2 12/01/2014 1532   MCHC 33.8 07/14/2017 0905   RDW 13.1 07/14/2017 0905   LYMPHSABS 1.8 07/14/2017 0905   MONOABS 0.4 07/14/2017 0905   EOSABS 0.1 07/14/2017 0905   BASOSABS 0.0 07/14/2017 0905   Iron/TIBC/Ferritin/ %Sat No results found for: IRON, TIBC, FERRITIN, IRONPCTSAT Lipid Panel     Component Value Date/Time   CHOL 180 08/26/2017 0932   TRIG 79 08/26/2017 0932   HDL 60 08/26/2017 0932   CHOLHDL 3 06/25/2017 0854   VLDL 10.8 06/25/2017 0854   LDLCALC 104 (H) 08/26/2017 0932   Hepatic Function Panel     Component Value Date/Time   PROT 6.9 08/26/2017 0932   ALBUMIN 4.4 08/26/2017 0932   AST 19 08/26/2017 0932   ALT 19 08/26/2017 0932   ALKPHOS 111 08/26/2017 0932   BILITOT 0.2 08/26/2017 0932   BILIDIR 0.1 05/12/2008 0942      Component Value Date/Time   TSH 4.03 07/14/2017 0905   TSH 1.688 12/01/2014 1532   TSH 1.93 05/12/2008 0942    ASSESSMENT AND PLAN: Insulin resistance  Class 3 severe obesity with serious comorbidity and body mass index (BMI) of 40.0 to 44.9 in adult, unspecified obesity type ([place])  PLAN:  Insulin Resistance Wanda Peters will continue to work on weight loss, diet, exercise, and decreasing simple carbohydrates in her diet to help decrease the risk of diabetes. We dicussed metformin including benefits and risks. She Peters informed that eating too many simple carbohydrates or too many calories at one sitting increases the likelihood of GI side effects. Wanda Peters to continue taking metformin and she Peters to follow up with our clinic in 3 weeks as directed to monitor her progress and we will recheck labs at that time.  We spent > than 50% of the 15 minute visit on the counseling as documented in the note.  Obesity Wanda Peters is currently in the action stage of change. As such, her goal is to continue with weight loss efforts She Peters agreed to follow the Category 3 plan Wanda Peters been instructed to  work up to a goal of 150 minutes of combined cardio and strengthening exercise per week for weight loss and overall health benefits. We discussed the following Behavioral Modification Strategies today: increasing lean protein intake, decreasing simple carbohydrates  and work on meal planning and easy cooking plans   Wanda Peters agreed to follow up with our clinic in 3 weeks. She Peters informed of the importance of frequent follow up visits to maximize her success with intensive lifestyle modifications for her multiple health conditions.   OBESITY BEHAVIORAL INTERVENTION VISIT  Today's visit Peters # 6 out of 22.  Starting weight: 281 lbs Starting  date: 08/26/17 Today's weight : 273 lbs  Today's date: 11/25/2017 Total lbs lost to date: 8 (Patients must lose 7 lbs in the first 6 months to continue with counseling)   ASK: We discussed the diagnosis of obesity with Wanda Peters today and Wanda Peters agreed to give [name] permission to discuss obesity behavioral modification therapy today.  ASSESS: Wanda Peters Peters the diagnosis of obesity and her BMI today is 41.52 Wanda Peters is in the action stage of change   ADVISE: Wanda Peters educated on the multiple health risks of obesity as well as the benefit of weight loss to improve her health. She Peters advised of the need for long term treatment and the importance of lifestyle modifications.  AGREE: Multiple dietary modification options and treatment options were discussed and  Wanda Peters agreed to the above obesity treatment plan.  I, Trixie Dredge, am acting as transcriptionist for Dennard Nip, MD  I have reviewed the above documentation for accuracy and completeness, and I agree with the above. -Dennard Nip, MD

## 2017-12-21 ENCOUNTER — Ambulatory Visit (INDEPENDENT_AMBULATORY_CARE_PROVIDER_SITE_OTHER): Payer: 59 | Admitting: Family Medicine

## 2017-12-21 VITALS — BP 106/72 | HR 60 | Temp 97.7°F | Ht 68.0 in | Wt 273.0 lb

## 2017-12-21 DIAGNOSIS — E8881 Metabolic syndrome: Secondary | ICD-10-CM | POA: Diagnosis not present

## 2017-12-21 DIAGNOSIS — Z9189 Other specified personal risk factors, not elsewhere classified: Secondary | ICD-10-CM

## 2017-12-21 DIAGNOSIS — E559 Vitamin D deficiency, unspecified: Secondary | ICD-10-CM | POA: Diagnosis not present

## 2017-12-21 DIAGNOSIS — Z6841 Body Mass Index (BMI) 40.0 and over, adult: Secondary | ICD-10-CM | POA: Diagnosis not present

## 2017-12-21 NOTE — Progress Notes (Signed)
Office: [phone]  /  Fax: [phone]   HPI:   Chief Complaint: OBESITY Wanda Peters is here to discuss her progress with her obesity treatment plan. She is on the Category 3 plan and is following her eating plan approximately 50 % of the time. She states she is not exercising. Wanda Peters has done well maintaining weight but finds it is more difficult to stay on track over the summer. She is getting bored with lunch and would like other options.   Her weight is 273 lb (123.8 kg) today and has had a weight loss of 0 pounds over a period of 3 week since her last visit. She has lost 8 lbs since starting treatment with [name].  Insulin Resistance Wanda Peters has a diagnosis of insulin resistance based on her elevated fasting insulin level >5. Although Wanda Peters's blood glucose readings are still under good control, insulin resistance puts her at greater risk of metabolic syndrome and diabetes. She is forgetting her metformin frequently, still notes polyphagia and denies hypoglycemia. She continues to work on diet and exercise to decrease risk of diabetes.  At risk for diabetes Wanda Peters is at higher than average risk for developing diabetes due to her obesity and insulin resistance. She currently denies polyuria or polydipsia.  Vitamin D deficiency Wanda Peters has a diagnosis of vitamin D deficiency. She is on prescription vit D, doing well and she is due for labs. She denies nausea, vomiting or muscle weakness.   ALLERGIES: Allergies  Allergen Reactions  . Sulfonamide Derivatives     REACTION: rash    MEDICATIONS: Current Outpatient Medications on File Prior to Visit  Medication Sig Dispense Refill  . Cholecalciferol (VITAMIN D3) 50000 units CAPS Take 1 capsule by mouth every 7 (seven) days. 12 capsule 0  . fexofenadine (ALLEGRA) 180 MG tablet Take 180 mg by mouth daily.    . fluticasone (FLONASE) 50 MCG/ACT nasal spray Place 1 spray into both nostrils daily.    . metFORMIN (GLUCOPHAGE)  500 MG tablet Take 1 tablet (500 mg total) by mouth 2 (two) times daily with a meal. 60 tablet 0   No current facility-administered medications on file prior to visit.     PAST MEDICAL HISTORY: Past Medical History:  Diagnosis Date  . Dry skin   . Fatigue   . Rupture of ovarian cyst   . Seasonal allergies   . Swelling of extremity   . Trouble in sleeping   . Vitamin D deficiency     PAST SURGICAL HISTORY: No past surgical history on file.  SOCIAL HISTORY: Social History   Tobacco Use  . Smoking status: Never Smoker  . Smokeless tobacco: Never Used  Substance Use Topics  . Alcohol use: Yes    Alcohol/week: 0.0 oz    Comment: socially  . Drug use: No    FAMILY HISTORY: Family History  Problem Relation Age of Onset  . Cancer Mother   . Obesity Father     ROS: Review of Systems  Constitutional: Negative for weight loss.  Gastrointestinal: Negative for nausea and vomiting.  Genitourinary: Negative for frequency.  Musculoskeletal:       Negative for muscle weakness  Endo/Heme/Allergies: Negative for polydipsia.       Negative for hypoglycemia Positive for polyphagia    PHYSICAL EXAM: Blood pressure 106/72, pulse 60, temperature 97.7 F (36.5 C), temperature source Oral, height 5\' 8"  (1.727 m), weight 273 lb (123.8 kg), last menstrual period 12/20/2017, SpO2 98 %. Body mass index is 41.51 kg/m. Physical  Exam  Constitutional: She is oriented to person, place, and time. She appears well-developed and well-nourished.  Cardiovascular: Normal rate.  Pulmonary/Chest: Effort normal.  Musculoskeletal: Normal range of motion.  Neurological: She is oriented to person, place, and time.  Skin: Skin is warm and dry.  Psychiatric: She has a normal mood and affect. Her behavior is normal.  Vitals reviewed.   RECENT LABS AND TESTS: BMET    Component Value Date/Time   NA 139 08/26/2017 0932   K 4.3 08/26/2017 0932   CL 104 08/26/2017 0932   CO2 22 08/26/2017 0932    GLUCOSE 81 08/26/2017 0932   GLUCOSE 95 06/25/2017 0854   BUN 13 08/26/2017 0932   CREATININE 0.74 08/26/2017 0932   CREATININE 0.63 12/01/2014 1532   CALCIUM 9.4 08/26/2017 0932   GFRNONAA 100 08/26/2017 0932   GFRAA 115 08/26/2017 0932   Lab Results  Component Value Date   HGBA1C 5.5 08/26/2017   HGBA1C 5.3 05/12/2008   Lab Results  Component Value Date   INSULIN 10.3 08/26/2017   CBC    Component Value Date/Time   WBC 5.9 07/14/2017 0905   RBC 4.19 07/14/2017 0905   HGB 13.4 07/14/2017 0905   HCT 39.5 07/14/2017 0905   PLT 227.0 07/14/2017 0905   MCV 94.3 07/14/2017 0905   MCH 31.2 12/01/2014 1532   MCHC 33.8 07/14/2017 0905   RDW 13.1 07/14/2017 0905   LYMPHSABS 1.8 07/14/2017 0905   MONOABS 0.4 07/14/2017 0905   EOSABS 0.1 07/14/2017 0905   BASOSABS 0.0 07/14/2017 0905   Iron/TIBC/Ferritin/ %Sat No results found for: IRON, TIBC, FERRITIN, IRONPCTSAT Lipid Panel     Component Value Date/Time   CHOL 180 08/26/2017 0932   TRIG 79 08/26/2017 0932   HDL 60 08/26/2017 0932   CHOLHDL 3 06/25/2017 0854   VLDL 10.8 06/25/2017 0854   LDLCALC 104 (H) 08/26/2017 0932   Hepatic Function Panel     Component Value Date/Time   PROT 6.9 08/26/2017 0932   ALBUMIN 4.4 08/26/2017 0932   AST 19 08/26/2017 0932   ALT 19 08/26/2017 0932   ALKPHOS 111 08/26/2017 0932   BILITOT 0.2 08/26/2017 0932   BILIDIR 0.1 05/12/2008 0942      Component Value Date/Time   TSH 4.03 07/14/2017 0905   TSH 1.688 12/01/2014 1532   TSH 1.93 05/12/2008 0942   Results for YOVANA, SCOGIN (MRN [id number]) as of 12/21/2017 09:22  Ref. Range 07/14/2017 09:05  VITD Latest Ref Range: 30.00 - 100.00 ng/mL 20.58 (L)   ASSESSMENT AND PLAN: Insulin resistance - Plan: Comprehensive metabolic panel, Hemoglobin A1c, Insulin, random, Lipid Panel With LDL/HDL Ratio  Vitamin D deficiency - Plan: VITAMIN D 25 Hydroxy (Vit-D Deficiency, Fractures)  At risk for diabetes mellitus  Class 3 severe  obesity with serious comorbidity and body mass index (BMI) of 40.0 to 44.9 in adult, unspecified obesity type ([facility])  PLAN:  Insulin Resistance Wanda Peters will continue to work on weight loss, diet, exercise, and decreasing simple carbohydrates in her diet to help decrease the risk of diabetes. We dicussed metformin including benefits and risks. She was informed that eating too many simple carbohydrates or too many calories at one sitting increases the likelihood of GI side effects. We discussed ways for her to remember to take metformin. We will check labs an Nikki agrees to follow up with our clinic in 2-3 weeks as directed to monitor her progress.  Diabetes risk counselling Wanda Peters was given extended (15 minutes) diabetes  prevention counseling today. She is 42 y.o. female and has risk factors for diabetes including obesity and insulin resistance. We discussed intensive lifestyle modifications today with an emphasis on weight loss as well as increasing exercise and decreasing simple carbohydrates in her diet.  Vitamin D Deficiency Wanda Peters was informed that low vitamin D levels contributes to fatigue and are associated with obesity, breast, and colon cancer. She agrees to continue to take prescription Vit D @50 ,000 IU every week and will follow up for routine testing of vitamin D, at least 2-3 times per year. She was informed of the risk of over-replacement of vitamin D and agrees to not increase her dose unless she discusses this with [name] first. We will check labs and Wanda Peters agrees to follow up with our clinic in 2-3 weeks.   Obesity: Wanda Peters is currently in the action stage of change. As such, her goal is to continue with weight loss efforts She has agreed to keep a food journal with 350-500 calories and 35+ grams of  protein at lunch daily and follow the Category 3 plan. Wanda Peters has been instructed to work up to a goal of 150 minutes of combined cardio and strengthening exercise per  week for weight loss and overall health benefits. We discussed the following Behavioral Modification Stratagies today: increasing lean protein intake, decreasing simple carbohydrates  and work on meal planning and easy cooking plans   Wanda Peters has agreed to follow up with our clinic in 2-3 weeks. She was informed of the importance of frequent follow up visits to maximize her success with intensive lifestyle modifications for her multiple health conditions.   OBESITY BEHAVIORAL INTERVENTION VISIT  Today's visit was # 7 out of 22.  Starting weight: 281 lbs Starting date: 08/26/17 Today's weight : Weight: 273 lb (123.8 kg)  Today's date: 12/21/2017 Total lbs lost to date: 8 lbs    ASK: We discussed the diagnosis of obesity with Wanda Peters today and Wanda Peters agreed to give [name] permission to discuss obesity behavioral modification therapy today.  ASSESS: Wanda Peters has the diagnosis of obesity and her BMI today is 41.52. Wanda Peters is in the action stage of change   ADVISE: Wanda Peters was educated on the multiple health risks of obesity as well as the benefit of weight loss to improve her health. She was advised of the need for long term treatment and the importance of lifestyle modifications.  AGREE: Multiple dietary modification options and treatment options were discussed and  Wanda Peters agreed to the above obesity treatment plan.  I, Wanda Potash, PA-C, am acting as transcriptionist for Dennard Nip, MD  I have reviewed the above documentation for accuracy and completeness, and I agree with the above. -Dennard Nip, MD

## 2017-12-22 LAB — COMPREHENSIVE METABOLIC PANEL
ALBUMIN: 4.1 g/dL (ref 3.5–5.5)
ALK PHOS: 85 IU/L (ref 39–117)
ALT: 12 IU/L (ref 0–32)
AST: 13 IU/L (ref 0–40)
Albumin/Globulin Ratio: 1.5 (ref 1.2–2.2)
BUN / CREAT RATIO: 23 (ref 9–23)
BUN: 17 mg/dL (ref 6–24)
CO2: 18 mmol/L — ABNORMAL LOW (ref 20–29)
Calcium: 9.2 mg/dL (ref 8.7–10.2)
Chloride: 105 mmol/L (ref 96–106)
Creatinine, Ser: 0.75 mg/dL (ref 0.57–1.00)
GFR calc Af Amer: 112 mL/min/{1.73_m2} (ref >59)
GFR calc non Af Amer: 97 mL/min/{1.73_m2} (ref >59)
GLOBULIN, TOTAL: 2.7 g/dL (ref 1.5–4.5)
GLUCOSE: 96 mg/dL (ref 65–99)
Potassium: 4.4 mmol/L (ref 3.5–5.2)
SODIUM: 137 mmol/L (ref 134–144)
Total Protein: 6.8 g/dL (ref 6.0–8.5)

## 2017-12-22 LAB — LIPID PANEL WITH LDL/HDL RATIO
Cholesterol, Total: 184 mg/dL (ref 100–199)
HDL: 53 mg/dL (ref >39)
LDL CALC: 114 mg/dL — AB (ref 0–99)
LDl/HDL Ratio: 2.2 ratio (ref 0.0–3.2)
TRIGLYCERIDES: 83 mg/dL (ref 0–149)
VLDL Cholesterol Cal: 17 mg/dL (ref 5–40)

## 2017-12-22 LAB — HEMOGLOBIN A1C
ESTIMATED AVERAGE GLUCOSE: 105 mg/dL
Hgb A1c MFr Bld: 5.3 % (ref 4.8–5.6)

## 2017-12-22 LAB — INSULIN, RANDOM: INSULIN: 8.6 u[IU]/mL (ref 2.6–24.9)

## 2017-12-22 LAB — VITAMIN D 25 HYDROXY (VIT D DEFICIENCY, FRACTURES): Vit D, 25-Hydroxy: 56.4 ng/mL (ref 30.0–100.0)

## 2017-12-25 MED FILL — NITROFURANTOIN MONO-MCR 100: 100 | 5 days supply | Qty: 10 | Fill #0

## 2018-01-06 ENCOUNTER — Ambulatory Visit (INDEPENDENT_AMBULATORY_CARE_PROVIDER_SITE_OTHER): Payer: 59 | Admitting: Family Medicine

## 2018-01-06 VITALS — BP 104/69 | HR 74 | Temp 97.9°F | Ht 68.0 in | Wt 274.0 lb

## 2018-01-06 DIAGNOSIS — Z6841 Body Mass Index (BMI) 40.0 and over, adult: Secondary | ICD-10-CM | POA: Diagnosis not present

## 2018-01-06 DIAGNOSIS — Z9189 Other specified personal risk factors, not elsewhere classified: Secondary | ICD-10-CM | POA: Diagnosis not present

## 2018-01-06 DIAGNOSIS — E8881 Metabolic syndrome: Secondary | ICD-10-CM

## 2018-01-06 MED ORDER — METFORMIN HCL 500 MG PO TABS: 500.0000 mg | ORAL_TABLET | Freq: Two times a day (BID) | ORAL | 0 refills | Status: DC

## 2018-01-06 NOTE — Progress Notes (Signed)
Office: [phone]  /  Fax: [phone]   HPI:   Chief Complaint: OBESITY Wanda Peters is here to discuss her progress with her obesity treatment plan. She is on the  keep a food journal with 350-500 calories and 35+ grams of protein at lunch daily and follow the Category 3 plan and is following her eating plan approximately 75 % of the time. She states she is exercising 0 minutes 0 times per week. [name] is retaining some H20 but is doing well with decreasing actual fat. She is going on vacation soon and has questions about how to handle eating while traveling.  Her weight is 274 lb (124.3 kg) today and has gained 1 pound since her last visit. She has lost 7 lbs since starting treatment with [name].  Insulin Resistance [name] has a diagnosis of insulin resistance based on her elevated fasting insulin level >5. Although [name]'s blood glucose readings are still under good control, insulin resistance puts her at greater risk of metabolic syndrome and diabetes. She is doing better on remembering to take metformin regularly. Her hunger is controlled and doing well on diet overall; A1c and insulin improved.  At risk for diabetes [name] is at higher than average risk for developing diabetes due to her obesity and insulin resistance. She currently denies polyuria or polydipsia.  ALLERGIES: Allergies  Allergen Reactions  . Sulfonamide Derivatives     REACTION: rash    MEDICATIONS: Current Outpatient Medications on File Prior to Visit  Medication Sig Dispense Refill  . Cholecalciferol (VITAMIN D3) 50000 units CAPS Take 1 capsule by mouth every 7 (seven) days. 12 capsule 0  . fexofenadine (ALLEGRA) 180 MG tablet Take 180 mg by mouth daily.    . fluticasone (FLONASE) 50 MCG/ACT nasal spray Place 1 spray into both nostrils daily.     No current facility-administered medications on file prior to visit.     PAST MEDICAL HISTORY: Past Medical History:  Diagnosis Date  . Dry skin     . Fatigue   . Rupture of ovarian cyst   . Seasonal allergies   . Swelling of extremity   . Trouble in sleeping   . Vitamin D deficiency     PAST SURGICAL HISTORY: No past surgical history on file.  SOCIAL HISTORY: Social History   Tobacco Use  . Smoking status: Never Smoker  . Smokeless tobacco: Never Used  Substance Use Topics  . Alcohol use: Yes    Alcohol/week: 0.0 oz    Comment: socially  . Drug use: No    FAMILY HISTORY: Family History  Problem Relation Age of Onset  . Cancer Mother   . Obesity Father     ROS: Review of Systems  Constitutional: Negative for weight loss.  Genitourinary: Negative for frequency.  Endo/Heme/Allergies: Negative for polydipsia.    PHYSICAL EXAM: Blood pressure 104/69, pulse 74, temperature 97.9 F (36.6 C), temperature source Oral, height 5\' 8"  (1.727 m), weight 274 lb (124.3 kg), last menstrual period 12/20/2017, SpO2 98 %. Body mass index is 41.66 kg/m. Physical Exam  Constitutional: She is oriented to person, place, and time. She appears well-developed and well-nourished.  Cardiovascular: Normal rate.  Pulmonary/Chest: Effort normal.  Musculoskeletal: Normal range of motion.  Neurological: She is oriented to person, place, and time.  Skin: Skin is warm and dry.  Psychiatric: She has a normal mood and affect. Her behavior is normal.  Vitals reviewed.   RECENT LABS AND TESTS: BMET    Component Value Date/Time  NA 137 12/21/2017 0802   K 4.4 12/21/2017 0802   CL 105 12/21/2017 0802   CO2 18 (L) 12/21/2017 0802   GLUCOSE 96 12/21/2017 0802   GLUCOSE 95 06/25/2017 0854   BUN 17 12/21/2017 0802   CREATININE 0.75 12/21/2017 0802   CREATININE 0.63 12/01/2014 1532   CALCIUM 9.2 12/21/2017 0802   GFRNONAA 97 12/21/2017 0802   GFRAA 112 12/21/2017 0802   Lab Results  Component Value Date   HGBA1C 5.3 12/21/2017   HGBA1C 5.5 08/26/2017   HGBA1C 5.3 05/12/2008   Lab Results  Component Value Date   INSULIN 8.6  12/21/2017   INSULIN 10.3 08/26/2017   CBC    Component Value Date/Time   WBC 5.9 07/14/2017 0905   RBC 4.19 07/14/2017 0905   HGB 13.4 07/14/2017 0905   HCT 39.5 07/14/2017 0905   PLT 227.0 07/14/2017 0905   MCV 94.3 07/14/2017 0905   MCH 31.2 12/01/2014 1532   MCHC 33.8 07/14/2017 0905   RDW 13.1 07/14/2017 0905   LYMPHSABS 1.8 07/14/2017 0905   MONOABS 0.4 07/14/2017 0905   EOSABS 0.1 07/14/2017 0905   BASOSABS 0.0 07/14/2017 0905   Iron/TIBC/Ferritin/ %Sat No results found for: IRON, TIBC, FERRITIN, IRONPCTSAT Lipid Panel     Component Value Date/Time   CHOL 184 12/21/2017 0802   TRIG 83 12/21/2017 0802   HDL 53 12/21/2017 0802   CHOLHDL 3 06/25/2017 0854   VLDL 10.8 06/25/2017 0854   LDLCALC 114 (H) 12/21/2017 0802   Hepatic Function Panel     Component Value Date/Time   PROT 6.8 12/21/2017 0802   ALBUMIN 4.1 12/21/2017 0802   AST 13 12/21/2017 0802   ALT 12 12/21/2017 0802   ALKPHOS 85 12/21/2017 0802   BILITOT <0.2 12/21/2017 0802   BILIDIR 0.1 05/12/2008 0942      Component Value Date/Time   TSH 4.03 07/14/2017 0905   TSH 1.688 12/01/2014 1532   TSH 1.93 05/12/2008 0942    ASSESSMENT AND PLAN: Insulin resistance - Plan: metFORMIN (GLUCOPHAGE) 500 MG tablet  At risk for diabetes mellitus  Class 3 severe obesity with serious comorbidity and body mass index (BMI) of 40.0 to 44.9 in adult, unspecified obesity type ([place])  PLAN:  Insulin Resistance Nandini will continue to work on weight loss, exercise, and decreasing simple carbohydrates in her diet to help decrease the risk of diabetes. We dicussed metformin including benefits and risks. She was informed that eating too many simple carbohydrates or too many calories at one sitting increases the likelihood of GI side effects. [name] agrees to continue taking metformin 500 mg BID #60 and we will refill for 1 month. [name] agrees to follow up with our clinic in 2 to 3 weeks as directed to monitor  her progress.  Diabetes risk counselling [name] was given extended (15 minutes) diabetes prevention counseling today. She is 42 y.o. female and has risk factors for diabetes including obesity and insulin resistance. We discussed intensive lifestyle modifications today with an emphasis on weight loss as well as increasing exercise and decreasing simple carbohydrates in her diet.  Obesity Teressa is currently in the action stage of change. As such, her goal is to continue with weight loss efforts She has agreed to follow the Category 3 plan Barabara has been instructed to work up to a goal of 150 minutes of combined cardio and strengthening exercise per week or she is to start cardio/strengthening for 30 minutes 2 times per week for weight loss and  overall health benefits. We discussed the following Behavioral Modification Strategies today: work on meal planning and easy cooking plans, travel eating strategies, and increase H20 intake    Uilani has agreed to follow up with our clinic in 2 to 3 weeks. She was informed of the importance of frequent follow up visits to maximize her success with intensive lifestyle modifications for her multiple health conditions.   OBESITY BEHAVIORAL INTERVENTION VISIT  Today's visit was # 8 out of 22.  Starting weight: 281 lbs Starting date: 08/26/17 Today's weight : 274 lbs Today's date: 01/06/2018 Total lbs lost to date: 7    ASK: We discussed the diagnosis of obesity with [name] today and Elcie agreed to give [name] permission to discuss obesity behavioral modification therapy today.  ASSESS: Damiah has the diagnosis of obesity and her BMI today is 41.67 Jamesetta is in the action stage of change   ADVISE: Parilee was educated on the multiple health risks of obesity as well as the benefit of weight loss to improve her health. She was advised of the need for long term treatment and the importance of lifestyle  modifications.  AGREE: Multiple dietary modification options and treatment options were discussed and  Gwendy agreed to the above obesity treatment plan.  I, Trixie Dredge, am acting as transcriptionist for Dennard Nip, MD  I have reviewed the above documentation for accuracy and completeness, and I agree with the above. -Dennard Nip, MD

## 2018-01-17 ENCOUNTER — Encounter (INDEPENDENT_AMBULATORY_CARE_PROVIDER_SITE_OTHER): Payer: Self-pay | Admitting: Family Medicine

## 2018-01-19 MED FILL — metFORMIN HCL 500 MG TABS: 500 | 30 days supply | Qty: 60 | Fill #0

## 2018-01-26 ENCOUNTER — Ambulatory Visit (INDEPENDENT_AMBULATORY_CARE_PROVIDER_SITE_OTHER): Payer: 59 | Admitting: Family Medicine

## 2018-01-26 VITALS — BP 109/74 | HR 60 | Temp 97.5°F | Ht 68.0 in | Wt 272.0 lb

## 2018-01-26 DIAGNOSIS — Z6841 Body Mass Index (BMI) 40.0 and over, adult: Secondary | ICD-10-CM

## 2018-01-26 DIAGNOSIS — Z9189 Other specified personal risk factors, not elsewhere classified: Secondary | ICD-10-CM

## 2018-01-26 DIAGNOSIS — R600 Localized edema: Secondary | ICD-10-CM

## 2018-01-26 MED ORDER — HYDROCHLOROTHIAZIDE 25 MG PO TABS: 25.0000 mg | ORAL_TABLET | Freq: Every day | ORAL | 0 refills | Status: DC

## 2018-01-26 MED FILL — HYDROCHLOROTHIAZIDE 25 MG T: 25 | 30 days supply | Qty: 30 | Fill #0

## 2018-01-26 NOTE — Progress Notes (Signed)
Office: [phone]  /  Fax: [phone]   HPI:   Chief Complaint: OBESITY Wanda Peters is here to discuss her progress with her obesity treatment plan. She is on the Category 3 plan and is following her eating plan approximately 60 % of the time. She states she is walking, biking and exercising to YouTube videos 15 minutes 3 to 4 times per week. Wanda Peters is doing well with weight loss, even with some recent traveling. She likes journaling for lunch, and she would like to be able to journal all day. Her weight is 272 lb (123.4 kg) today and has had a weight loss of 2 pounds over a period of 3 weeks since her last visit. She has lost 9 lbs since starting treatment with Wanda Peters.  Edema bilateral lower extremities [name] notes significant bilateral lower extremity edema when she travels; even with increasing her H2O intake and trying to decrease her sodium intake. Her electrolytes are within normal limits and she has normal renal function.  At risk for cardiovascular disease Wanda Peters is at a higher than average risk for cardiovascular disease due to obesity and edema. She currently denies any chest pain.  ALLERGIES: Allergies  Allergen Reactions  . Sulfonamide Derivatives     REACTION: rash    MEDICATIONS: Current Outpatient Medications on File Prior to Visit  Medication Sig Dispense Refill  . Cholecalciferol (VITAMIN D3) 50000 units CAPS Take 1 capsule by mouth every 7 (seven) days. 12 capsule 0  . fexofenadine (ALLEGRA) 180 MG tablet Take 180 mg by mouth daily.    . fluticasone (FLONASE) 50 MCG/ACT nasal spray Place 1 spray into both nostrils daily.    . metFORMIN (GLUCOPHAGE) 500 MG tablet Take 1 tablet (500 mg total) by mouth 2 (two) times daily with a meal. 60 tablet 0   No current facility-administered medications on file prior to visit.     PAST MEDICAL HISTORY: Past Medical History:  Diagnosis Date  . Dry skin   . Fatigue   . Rupture of ovarian cyst   . Seasonal  allergies   . Swelling of extremity   . Trouble in sleeping   . Vitamin D deficiency     PAST SURGICAL HISTORY: No past surgical history on file.  SOCIAL HISTORY: Social History   Tobacco Use  . Smoking status: Never Smoker  . Smokeless tobacco: Never Used  Substance Use Topics  . Alcohol use: Yes    Alcohol/week: 0.0 standard drinks    Comment: socially  . Drug use: No    FAMILY HISTORY: Family History  Problem Relation Age of Onset  . Cancer Mother   . Obesity Father     ROS: Review of Systems  Constitutional: Positive for weight loss.  Cardiovascular: Negative for chest pain.  Musculoskeletal:       Positive for lower extremity edema    PHYSICAL EXAM: Blood pressure 109/74, pulse 60, temperature (!) 97.5 F (36.4 C), temperature source Oral, height 5\' 8"  (1.727 m), weight 272 lb (123.4 kg), last menstrual period 01/07/2018, SpO2 98 %. Body mass index is 41.36 kg/m. Physical Exam  Constitutional: She is oriented to person, place, and time. She appears well-developed and well-nourished.  Cardiovascular: Normal rate.  Pulmonary/Chest: Effort normal.  Musculoskeletal: Normal range of motion. She exhibits edema.  Neurological: She is oriented to person, place, and time.  Skin: Skin is warm and dry.  Psychiatric: She has a normal mood and affect. Her behavior is normal.  Vitals reviewed.   RECENT LABS  AND TESTS: BMET    Component Value Date/Time   NA 137 12/21/2017 0802   K 4.4 12/21/2017 0802   CL 105 12/21/2017 0802   CO2 18 (L) 12/21/2017 0802   GLUCOSE 96 12/21/2017 0802   GLUCOSE 95 06/25/2017 0854   BUN 17 12/21/2017 0802   CREATININE 0.75 12/21/2017 0802   CREATININE 0.63 12/01/2014 1532   CALCIUM 9.2 12/21/2017 0802   GFRNONAA 97 12/21/2017 0802   GFRAA 112 12/21/2017 0802   Lab Results  Component Value Date   HGBA1C 5.3 12/21/2017   HGBA1C 5.5 08/26/2017   HGBA1C 5.3 05/12/2008   Lab Results  Component Value Date   INSULIN 8.6  12/21/2017   INSULIN 10.3 08/26/2017   CBC    Component Value Date/Time   WBC 5.9 07/14/2017 0905   RBC 4.19 07/14/2017 0905   HGB 13.4 07/14/2017 0905   HCT 39.5 07/14/2017 0905   PLT 227.0 07/14/2017 0905   MCV 94.3 07/14/2017 0905   MCH 31.2 12/01/2014 1532   MCHC 33.8 07/14/2017 0905   RDW 13.1 07/14/2017 0905   LYMPHSABS 1.8 07/14/2017 0905   MONOABS 0.4 07/14/2017 0905   EOSABS 0.1 07/14/2017 0905   BASOSABS 0.0 07/14/2017 0905   Iron/TIBC/Ferritin/ %Sat No results found for: IRON, TIBC, FERRITIN, IRONPCTSAT Lipid Panel     Component Value Date/Time   CHOL 184 12/21/2017 0802   TRIG 83 12/21/2017 0802   HDL 53 12/21/2017 0802   CHOLHDL 3 06/25/2017 0854   VLDL 10.8 06/25/2017 0854   LDLCALC 114 (H) 12/21/2017 0802   Hepatic Function Panel     Component Value Date/Time   PROT 6.8 12/21/2017 0802   ALBUMIN 4.1 12/21/2017 0802   AST 13 12/21/2017 0802   ALT 12 12/21/2017 0802   ALKPHOS 85 12/21/2017 0802   BILITOT <0.2 12/21/2017 0802   BILIDIR 0.1 05/12/2008 0942      Component Value Date/Time   TSH 4.03 07/14/2017 0905   TSH 1.688 12/01/2014 1532   TSH 1.93 05/12/2008 0942   Results for Wanda, Peters (MRN [id number]) as of 01/26/2018 14:00  Ref. Range 12/21/2017 08:02  Vitamin D, 25-Hydroxy Latest Ref Range: 30.0 - 100.0 ng/mL 56.4   ASSESSMENT AND PLAN: Bilateral lower extremity edema - Plan: hydrochlorothiazide (HYDRODIURIL) 25 MG tablet  At risk for heart disease  Class 3 severe obesity with serious comorbidity and body mass index (BMI) of 40.0 to 44.9 in adult, unspecified obesity type ([place])  PLAN:  Edema bilateral lower extremities [name] agrees to start HCTZ 25 mg qd as needed #30 with no refills and follow up with our clinic in 2 to 3 weeks.  Cardiovascular risk counseling [name] was given extended (15 minutes) coronary artery disease prevention counseling today. She is 42 y.o. female and has risk factors for heart disease  including obesity and edema. We discussed intensive lifestyle modifications today with an emphasis on specific weight loss instructions and strategies. Pt was also informed of the importance of increasing exercise and decreasing saturated fats to help prevent heart disease.  Obesity Wanda Peters is currently in the action stage of change. As such, her goal is to continue with weight loss efforts She has agreed to keep a food journal with 1300 to 1600 calories and 100 grams of protein daily Jerrica has been instructed to work up to a goal of 150 minutes of combined cardio and strengthening exercise per week for weight loss and overall health benefits. We discussed the following Behavioral Modification Strategies  today: increasing lean protein intake, decreasing simple carbohydrates , work on meal planning and easy cooking plans, holiday eating strategies and celebration eating strategies  Taite has agreed to follow up with our clinic in 2 to 3 weeks. She was informed of the importance of frequent follow up visits to maximize her success with intensive lifestyle modifications for her multiple health conditions.   OBESITY BEHAVIORAL INTERVENTION VISIT  Today's visit was # 9   Starting weight: 281 lbs Starting date: 08/26/17 Today's weight : 272 lbs  Today's date: 01/26/2018 Total lbs lost to date: 9 At least 15 minutes were spent on discussing the following behavioral intervention visit.   ASK: We discussed the diagnosis of obesity with [name] today and Brianni agreed to give Wanda Peters permission to discuss obesity behavioral modification therapy today.  ASSESS: Mykala has the diagnosis of obesity and her BMI today is 41.37 Shary is in the action stage of change   ADVISE: Mileah was educated on the multiple health risks of obesity as well as the benefit of weight loss to improve her health. She was advised of the need for long term treatment and the importance of  lifestyle modifications to improve her current health and to decrease her risk of future health problems.  AGREE: Multiple dietary modification options and treatment options were discussed and  Brittanny agreed to follow the recommendations documented in the above note.  ARRANGE: Dierdra was educated on the importance of frequent visits to treat obesity as outlined per CMS and USPSTF guidelines and agreed to schedule her next follow up appointment today.  I, Doreene Nest, am acting as transcriptionist for Dennard Nip, MD  I have reviewed the above documentation for accuracy and completeness, and I agree with the above. -Dennard Nip, MD

## 2018-02-17 ENCOUNTER — Ambulatory Visit (INDEPENDENT_AMBULATORY_CARE_PROVIDER_SITE_OTHER): Payer: 59 | Admitting: Family Medicine

## 2018-02-17 VITALS — BP 103/73 | HR 71 | Temp 98.1°F | Ht 68.0 in | Wt 270.0 lb

## 2018-02-17 DIAGNOSIS — Z6841 Body Mass Index (BMI) 40.0 and over, adult: Secondary | ICD-10-CM | POA: Diagnosis not present

## 2018-02-17 DIAGNOSIS — Z9189 Other specified personal risk factors, not elsewhere classified: Secondary | ICD-10-CM

## 2018-02-17 DIAGNOSIS — R600 Localized edema: Secondary | ICD-10-CM

## 2018-02-17 MED ORDER — HYDROCHLOROTHIAZIDE 25 MG PO TABS: 25.0000 mg | ORAL_TABLET | Freq: Every day | ORAL | 0 refills | Status: DC

## 2018-02-17 NOTE — Progress Notes (Signed)
Office: [phone]  /  Fax: [phone]   HPI:   Chief Complaint: OBESITY Wanda Peters is here to discuss her progress with her obesity treatment plan. She is on the keep a food journal with 1300 to 1600 calories and 100 grams of protein daily and is following her eating plan approximately 75 % of the time. She states she is walking and bike riding 15 minutes 3 times per week. Kagan continues to do well with weight loss and with journaling, although she struggled to journal every day. Hunger is controlled overall and she is doing well with meeting her protein goal. Her weight is 270 lb (122.5 kg) today and has had a weight loss of 2 pounds over a period of 3 weeks since her last visit. She has lost 11 lbs since starting treatment with [name].  Bilateral lower extremity edema [name] had a diagnosis of bilateral lower extremity edema and this improved on HCTZ. She denies lightheadedness or dizziness.  At risk for cardiovascular disease Wanda Peters is at a higher than average risk for cardiovascular disease due to obesity and edema. She currently denies any chest pain.  ALLERGIES: Allergies  Allergen Reactions  . Sulfonamide Derivatives     REACTION: rash    MEDICATIONS: Current Outpatient Medications on File Prior to Visit  Medication Sig Dispense Refill  . Cholecalciferol (VITAMIN D3) 50000 units CAPS Take 1 capsule by mouth every 7 (seven) days. 12 capsule 0  . fexofenadine (ALLEGRA) 180 MG tablet Take 180 mg by mouth daily.    . fluticasone (FLONASE) 50 MCG/ACT nasal spray Place 1 spray into both nostrils daily.    . metFORMIN (GLUCOPHAGE) 500 MG tablet Take 1 tablet (500 mg total) by mouth 2 (two) times daily with a meal. 60 tablet 0   No current facility-administered medications on file prior to visit.     PAST MEDICAL HISTORY: Past Medical History:  Diagnosis Date  . Dry skin   . Fatigue   . Rupture of ovarian cyst   . Seasonal allergies   . Swelling of extremity     . Trouble in sleeping   . Vitamin D deficiency     PAST SURGICAL HISTORY: No past surgical history on file.  SOCIAL HISTORY: Social History   Tobacco Use  . Smoking status: Never Smoker  . Smokeless tobacco: Never Used  Substance Use Topics  . Alcohol use: Yes    Alcohol/week: 0.0 standard drinks    Comment: socially  . Drug use: No    FAMILY HISTORY: Family History  Problem Relation Age of Onset  . Cancer Mother   . Obesity Father     ROS: Review of Systems  Constitutional: Positive for weight loss.  Cardiovascular: Negative for chest pain.  Neurological: Negative for dizziness.       Negative for lightheadedness    PHYSICAL EXAM: Blood pressure 103/73, pulse 71, temperature 98.1 F (36.7 C), temperature source Oral, height 5\' 8"  (1.727 m), weight 270 lb (122.5 kg), last menstrual period 01/27/2018, SpO2 97 %. Body mass index is 41.05 kg/m. Physical Exam  Constitutional: She is oriented to person, place, and time. She appears well-developed and well-nourished.  Cardiovascular: Normal rate.  Pulmonary/Chest: Effort normal.  Musculoskeletal: Normal range of motion.  Neurological: She is oriented to person, place, and time.  Skin: Skin is warm and dry.  Psychiatric: She has a normal mood and affect. Her behavior is normal.  Vitals reviewed.   RECENT LABS AND TESTS: BMET    Component  Value Date/Time   NA 137 12/21/2017 0802   K 4.4 12/21/2017 0802   CL 105 12/21/2017 0802   CO2 18 (L) 12/21/2017 0802   GLUCOSE 96 12/21/2017 0802   GLUCOSE 95 06/25/2017 0854   BUN 17 12/21/2017 0802   CREATININE 0.75 12/21/2017 0802   CREATININE 0.63 12/01/2014 1532   CALCIUM 9.2 12/21/2017 0802   GFRNONAA 97 12/21/2017 0802   GFRAA 112 12/21/2017 0802   Lab Results  Component Value Date   HGBA1C 5.3 12/21/2017   HGBA1C 5.5 08/26/2017   HGBA1C 5.3 05/12/2008   Lab Results  Component Value Date   INSULIN 8.6 12/21/2017   INSULIN 10.3 08/26/2017   CBC     Component Value Date/Time   WBC 5.9 07/14/2017 0905   RBC 4.19 07/14/2017 0905   HGB 13.4 07/14/2017 0905   HCT 39.5 07/14/2017 0905   PLT 227.0 07/14/2017 0905   MCV 94.3 07/14/2017 0905   MCH 31.2 12/01/2014 1532   MCHC 33.8 07/14/2017 0905   RDW 13.1 07/14/2017 0905   LYMPHSABS 1.8 07/14/2017 0905   MONOABS 0.4 07/14/2017 0905   EOSABS 0.1 07/14/2017 0905   BASOSABS 0.0 07/14/2017 0905   Iron/TIBC/Ferritin/ %Sat No results found for: IRON, TIBC, FERRITIN, IRONPCTSAT Lipid Panel     Component Value Date/Time   CHOL 184 12/21/2017 0802   TRIG 83 12/21/2017 0802   HDL 53 12/21/2017 0802   CHOLHDL 3 06/25/2017 0854   VLDL 10.8 06/25/2017 0854   LDLCALC 114 (H) 12/21/2017 0802   Hepatic Function Panel     Component Value Date/Time   PROT 6.8 12/21/2017 0802   ALBUMIN 4.1 12/21/2017 0802   AST 13 12/21/2017 0802   ALT 12 12/21/2017 0802   ALKPHOS 85 12/21/2017 0802   BILITOT <0.2 12/21/2017 0802   BILIDIR 0.1 05/12/2008 0942      Component Value Date/Time   TSH 4.03 07/14/2017 0905   TSH 1.688 12/01/2014 1532   TSH 1.93 05/12/2008 0942   Results for RILIE, GLANZ (MRN [id number]) as of 02/17/2018 17:13  Ref. Range 12/21/2017 08:02  Vitamin D, 25-Hydroxy Latest Ref Range: 30.0 - 100.0 ng/mL 56.4   ASSESSMENT AND PLAN: Bilateral lower extremity edema - Plan: hydrochlorothiazide (HYDRODIURIL) 25 MG tablet  At risk for heart disease  Class 3 severe obesity with serious comorbidity and body mass index (BMI) of 40.0 to 44.9 in adult, unspecified obesity type (HCC)  PLAN:  Bilateral lower extremity edema Wanda Peters agrees to continue HCTZ 25 mg qd #30 with no refills and follow up at the agreed upon time. We will recheck labs in 1 month.  Cardiovascular risk counseling Wanda Peters was given extended (15 minutes) coronary artery disease prevention counseling today. She is 42 y.o. female and has risk factors for heart disease including obesity and edema. We  discussed intensive lifestyle modifications today with an emphasis on specific weight loss instructions and strategies. Pt was also informed of the importance of increasing exercise and decreasing saturated fats to help prevent heart disease.  Obesity Wanda Peters is currently in the action stage of change. As such, her goal is to continue with weight loss efforts She has agreed to keep a food journal with 1400 to 1600 calories and 80+ grams of protein daily Alyze has been instructed to work up to a goal of 150 minutes of combined cardio exercise per week and she will add strengthening exercise for weight loss and overall health benefits. We discussed the following Behavioral Modification Strategies today:  increasing lean protein intake and decreasing simple carbohydrates   Kamaria has agreed to follow up with our clinic in 3 weeks. She was informed of the importance of frequent follow up visits to maximize her success with intensive lifestyle modifications for her multiple health conditions.   OBESITY BEHAVIORAL INTERVENTION VISIT  Today's visit was # 10   Starting weight: 281 lbs Starting date: 08/26/17 Today's weight : 270 lbs Today's date: 02/17/2018 Total lbs lost to date: 11   ASK: We discussed the diagnosis of obesity with [name] today and Kanyon agreed to give [name] permission to discuss obesity behavioral modification therapy today.  ASSESS: Talayia has the diagnosis of obesity and her BMI today is 41.06 Laylynn is in the action stage of change   ADVISE: Wilfred was educated on the multiple health risks of obesity as well as the benefit of weight loss to improve her health. She was advised of the need for long term treatment and the importance of lifestyle modifications to improve her current health and to decrease her risk of future health problems.  AGREE: Multiple dietary modification options and treatment options were discussed and  Chiffon  agreed to follow the recommendations documented in the above note.  ARRANGE: Awilda was educated on the importance of frequent visits to treat obesity as outlined per CMS and USPSTF guidelines and agreed to schedule her next follow up appointment today.  I, Doreene Nest, am acting as transcriptionist for Dennard Nip, MD  I have reviewed the above documentation for accuracy and completeness, and I agree with the above. -Dennard Nip, MD

## 2018-02-21 DIAGNOSIS — H16201 Unspecified keratoconjunctivitis, right eye: Secondary | ICD-10-CM | POA: Diagnosis not present

## 2018-02-25 MED FILL — HYDROCHLOROTHIAZIDE 25 MG T: 25 | 30 days supply | Qty: 30 | Fill #0

## 2018-03-09 ENCOUNTER — Ambulatory Visit (INDEPENDENT_AMBULATORY_CARE_PROVIDER_SITE_OTHER): Payer: 59 | Admitting: Family Medicine

## 2018-03-09 VITALS — BP 102/69 | HR 65 | Temp 98.6°F | Ht 68.0 in | Wt 271.0 lb

## 2018-03-09 DIAGNOSIS — F3289 Other specified depressive episodes: Secondary | ICD-10-CM

## 2018-03-09 DIAGNOSIS — Z6841 Body Mass Index (BMI) 40.0 and over, adult: Secondary | ICD-10-CM | POA: Diagnosis not present

## 2018-03-09 DIAGNOSIS — Z9189 Other specified personal risk factors, not elsewhere classified: Secondary | ICD-10-CM

## 2018-03-09 DIAGNOSIS — R7303 Prediabetes: Secondary | ICD-10-CM

## 2018-03-09 MED ORDER — METFORMIN HCL 500 MG PO TABS: 500.0000 mg | ORAL_TABLET | Freq: Two times a day (BID) | ORAL | 0 refills | Status: DC

## 2018-03-09 MED ORDER — BUPROPION HCL ER (SR) 150 MG PO TB12
150.0000 mg | ORAL_TABLET | Freq: Every day | ORAL | 0 refills | Status: DC
Start: 2018-03-09 — End: 2018-03-30

## 2018-03-09 MED FILL — metFORMIN HCL 500 MG TABS: 500 | 30 days supply | Qty: 60 | Fill #0

## 2018-03-09 MED FILL — BUPROPION SR 150 MG TABLET: 150 | 30 days supply | Qty: 30 | Fill #0

## 2018-03-09 NOTE — Progress Notes (Signed)
Office: [phone]  /  Fax: [phone]   HPI:   Chief Complaint: OBESITY Wanda Peters is here to discuss her progress with her obesity treatment plan. She is keeping a food journal with 1500 calories and 80 to 100 grams of protein and is following her eating plan approximately 50 % of the time. She states she is walking or activity 20 minutes 3 to 4 times per week. Wanda Peters has struggled to stay on track since last visit. Work has been more hectic and her meal plan has suffered. She also is not sleeping as well due to this extra stress.  Her weight is 271 lb (122.9 kg) today and has not lost weight since her last visit. She has lost 10 lbs since starting treatment with [name].  Depression with emotional eating behaviors Wanda Peters is struggling with emotional eating and using food for comfort to the extent that it is negatively impacting her health. She notes that her mood has decreased and she is stress and comfort snacking. She shows no sign of suicidal or homicidal ideations.  Pre-Diabetes Wanda Peters has a diagnosis of pre-diabetes based on her elevated Hgb A1c and was informed this puts her at greater risk of developing diabetes. She is working on diet and taking metformin. She misses her 2nd dose approximately 50 % of the time, but is working on ways to remember.   At risk for cardiovascular disease Wanda Peters is at a higher than average risk for cardiovascular disease due to pre-diabetes and obesity.   ALLERGIES: Allergies  Allergen Reactions  . Sulfonamide Derivatives     REACTION: rash    MEDICATIONS: Current Outpatient Medications on File Prior to Visit  Medication Sig Dispense Refill  . Cholecalciferol (VITAMIN D3) 50000 units CAPS Take 1 capsule by mouth every 7 (seven) days. 12 capsule 0  . fexofenadine (ALLEGRA) 180 MG tablet Take 180 mg by mouth daily.    . fluticasone (FLONASE) 50 MCG/ACT nasal spray Place 1 spray into both nostrils daily.    . hydrochlorothiazide  (HYDRODIURIL) 25 MG tablet Take 1 tablet (25 mg total) by mouth daily. 30 tablet 0   No current facility-administered medications on file prior to visit.     PAST MEDICAL HISTORY: Past Medical History:  Diagnosis Date  . Dry skin   . Fatigue   . Rupture of ovarian cyst   . Seasonal allergies   . Swelling of extremity   . Trouble in sleeping   . Vitamin D deficiency     PAST SURGICAL HISTORY: No past surgical history on file.  SOCIAL HISTORY: Social History   Tobacco Use  . Smoking status: Never Smoker  . Smokeless tobacco: Never Used  Substance Use Topics  . Alcohol use: Yes    Alcohol/week: 0.0 standard drinks    Comment: socially  . Drug use: No    FAMILY HISTORY: Family History  Problem Relation Age of Onset  . Cancer Mother   . Obesity Father     ROS: Review of Systems  Constitutional: Negative for weight loss.  Psychiatric/Behavioral: Positive for depression. Negative for suicidal ideas.       Negative for homicidal ideations.    PHYSICAL EXAM: Blood pressure 102/69, pulse 65, temperature 98.6 F (37 C), temperature source Oral, height 5\' 8"  (1.727 m), weight 271 lb (122.9 kg), last menstrual period 03/01/2018, SpO2 99 %. Body mass index is 41.21 kg/m. Physical Exam  Constitutional: She is oriented to person, place, and time. She appears well-developed and well-nourished.  Cardiovascular:  Normal rate.  Pulmonary/Chest: Effort normal.  Musculoskeletal: Normal range of motion.  Neurological: She is oriented to person, place, and time.  Skin: Skin is warm and dry.  Psychiatric: She has a normal mood and affect. Her behavior is normal.  Vitals reviewed.   RECENT LABS AND TESTS: BMET    Component Value Date/Time   NA 137 12/21/2017 0802   K 4.4 12/21/2017 0802   CL 105 12/21/2017 0802   CO2 18 (L) 12/21/2017 0802   GLUCOSE 96 12/21/2017 0802   GLUCOSE 95 06/25/2017 0854   BUN 17 12/21/2017 0802   CREATININE 0.75 12/21/2017 0802   CREATININE  0.63 12/01/2014 1532   CALCIUM 9.2 12/21/2017 0802   GFRNONAA 97 12/21/2017 0802   GFRAA 112 12/21/2017 0802   Lab Results  Component Value Date   HGBA1C 5.3 12/21/2017   HGBA1C 5.5 08/26/2017   HGBA1C 5.3 05/12/2008   Lab Results  Component Value Date   INSULIN 8.6 12/21/2017   INSULIN 10.3 08/26/2017   CBC    Component Value Date/Time   WBC 5.9 07/14/2017 0905   RBC 4.19 07/14/2017 0905   HGB 13.4 07/14/2017 0905   HCT 39.5 07/14/2017 0905   PLT 227.0 07/14/2017 0905   MCV 94.3 07/14/2017 0905   MCH 31.2 12/01/2014 1532   MCHC 33.8 07/14/2017 0905   RDW 13.1 07/14/2017 0905   LYMPHSABS 1.8 07/14/2017 0905   MONOABS 0.4 07/14/2017 0905   EOSABS 0.1 07/14/2017 0905   BASOSABS 0.0 07/14/2017 0905   Iron/TIBC/Ferritin/ %Sat No results found for: IRON, TIBC, FERRITIN, IRONPCTSAT Lipid Panel     Component Value Date/Time   CHOL 184 12/21/2017 0802   TRIG 83 12/21/2017 0802   HDL 53 12/21/2017 0802   CHOLHDL 3 06/25/2017 0854   VLDL 10.8 06/25/2017 0854   LDLCALC 114 (H) 12/21/2017 0802   Hepatic Function Panel     Component Value Date/Time   PROT 6.8 12/21/2017 0802   ALBUMIN 4.1 12/21/2017 0802   AST 13 12/21/2017 0802   ALT 12 12/21/2017 0802   ALKPHOS 85 12/21/2017 0802   BILITOT <0.2 12/21/2017 0802   BILIDIR 0.1 05/12/2008 0942      Component Value Date/Time   TSH 4.03 07/14/2017 0905   TSH 1.688 12/01/2014 1532   TSH 1.93 05/12/2008 0942   Results for RHEMI, BALBACH (MRN [id number]) as of 03/09/2018 14:12  Ref. Range 12/21/2017 08:02  Vitamin D, 25-Hydroxy Latest Ref Range: 30.0 - 100.0 ng/mL 56.4   ASSESSMENT AND PLAN: Prediabetes - Plan: metFORMIN (GLUCOPHAGE) 500 MG tablet  Other depression - with emotional eating - Plan: buPROPion (WELLBUTRIN SR) 150 MG 12 hr tablet  At risk for heart disease  Class 3 severe obesity with serious comorbidity and body mass index (BMI) of 40.0 to 44.9 in adult, unspecified obesity type  ([place])  PLAN:  Pre-Diabetes Wanda Peters will continue to work on weight loss, exercise, and decreasing simple carbohydrates in her diet to help decrease the risk of diabetes. She was informed that eating too many simple carbohydrates or too many calories at one sitting increases the likelihood of GI side effects. Wanda Peters agrees to continue metformin 500mg  BID # 60 for 1 month with no refills and a prescription was written today. Wanda Peters agreed to follow up with [name] as directed to monitor her progress in 2 to 3 weeks.  Cardiovascular risk counseling Wanda Peters was given extended (15 minutes) coronary artery disease prevention counseling today. She is 42 y.o. female and has  risk factors for heart disease including pre-diabetes and obesity. We discussed intensive lifestyle modifications today with an emphasis on specific weight loss instructions and strategies. Pt was also informed of the importance of increasing exercise and decreasing saturated fats to help prevent heart disease.  Depression with Emotional Eating Behaviors We discussed behavior modification techniques today to help Wanda Peters deal with her emotional eating and depression. She has agreed to take Wellbutrin SR 150 mg qAM  #30 with no refills. She has agreed to follow up as directed in 2 weeks.  Obesity Wanda Peters is currently in the action stage of change. As such, her goal is to continue with weight loss efforts. She has agreed to keep a food journal with 1500 calories and 80 grams of protein.  Wanda Peters has been instructed to work up to a goal of 150 minutes of combined cardio and strengthening exercise per week for weight loss and overall health benefits. We discussed the following Behavioral Modification Strategies today: increasing lean protein intake, decreasing simple carbohydrates,  and emotional eating strategies.  Wanda Peters has agreed to follow up with our clinic in 2 to 3 weeks. She was informed of the importance of frequent  follow up visits to maximize her success with intensive lifestyle modifications for her multiple health conditions.   OBESITY BEHAVIORAL INTERVENTION VISIT  Today's visit was # 11   Starting weight: 281 lbs Starting date: 08/26/17 Today's weight : Weight: 271 lb (122.9 kg)  Today's date: 03/09/2018 Total lbs lost to date: 10  ASK: We discussed the diagnosis of obesity with Wanda Peters today and Wanda Peters agreed to give [name] permission to discuss obesity behavioral modification therapy today.  ASSESS: Wanda Peters has the diagnosis of obesity and her BMI today is 41.22. Wanda Peters is in the action stage of change.   ADVISE: Wanda Peters was educated on the multiple health risks of obesity as well as the benefit of weight loss to improve her health. She was advised of the need for long term treatment and the importance of lifestyle modifications to improve her current health and to decrease her risk of future health problems.  AGREE: Multiple dietary modification options and treatment options were discussed and Wanda Peters agreed to follow the recommendations documented in the above note.  ARRANGE: Saadiya was educated on the importance of frequent visits to treat obesity as outlined per CMS and USPSTF guidelines and agreed to schedule her next follow up appointment today.  Wanda Peters, Wanda Peters, am acting as transcriptionist for Starlyn Skeans, MD  Wanda Peters have reviewed the above documentation for accuracy and completeness, and Wanda Peters agree with the above. -Dennard Nip, MD

## 2018-03-30 ENCOUNTER — Ambulatory Visit (INDEPENDENT_AMBULATORY_CARE_PROVIDER_SITE_OTHER): Payer: 59 | Admitting: Family Medicine

## 2018-03-30 VITALS — BP 100/69 | HR 63 | Temp 98.2°F | Ht 68.0 in | Wt 270.0 lb

## 2018-03-30 DIAGNOSIS — Z9189 Other specified personal risk factors, not elsewhere classified: Secondary | ICD-10-CM

## 2018-03-30 DIAGNOSIS — I10 Essential (primary) hypertension: Secondary | ICD-10-CM

## 2018-03-30 DIAGNOSIS — Z6841 Body Mass Index (BMI) 40.0 and over, adult: Secondary | ICD-10-CM

## 2018-03-30 DIAGNOSIS — R7303 Prediabetes: Secondary | ICD-10-CM

## 2018-03-30 DIAGNOSIS — F3289 Other specified depressive episodes: Secondary | ICD-10-CM | POA: Diagnosis not present

## 2018-03-30 MED ORDER — METFORMIN HCL 500 MG PO TABS: 500.0000 mg | ORAL_TABLET | Freq: Two times a day (BID) | ORAL | 0 refills | Status: DC

## 2018-03-30 MED ORDER — HYDROCHLOROTHIAZIDE 25 MG PO TABS: 25.0000 mg | ORAL_TABLET | Freq: Every day | ORAL | 0 refills | Status: DC

## 2018-03-30 MED ORDER — BUPROPION HCL ER (SR) 150 MG PO TB12: 150.0000 mg | ORAL_TABLET | Freq: Every day | ORAL | 0 refills | Status: DC

## 2018-03-30 MED FILL — HYDROCHLOROTHIAZIDE 25 MG T: 25 | 30 days supply | Qty: 30 | Fill #0

## 2018-03-31 NOTE — Progress Notes (Signed)
Office: [phone]  /  Fax: [phone]   HPI:   Chief Complaint: OBESITY Wanda Peters is here to discuss her progress with her obesity treatment plan. She is on the keep a food journal with 1500 calories and 80 grams of protein daily and is following her eating plan approximately 80 % of the time. She states she is walking for 30 minutes 2-3 times per week. Wanda Peters continues to lose weight. Her hunger is controlled but she struggles to eat all her protein. She notes decreased work stress, but has had increased temptations with Halloween candy.  Her weight is 270 lb (122.5 kg) today and has had a weight loss of 1 pound over a period of 3 weeks since her last visit. She has lost 11 lbs since starting treatment with [name].  Hypertension Wanda Peters is a 42 y.o. female with hypertension. Wanda Peters's blood pressure is stable on hydrochlorothiazide, and she denies chest pain. She is working weight loss to help control her blood pressure with the goal of decreasing her risk of heart attack and stroke. Wanda Peters's blood pressure is currently controlled.  At risk for cardiovascular disease Wanda Peters is at a higher than average risk for cardiovascular disease due to obesity and hypertension. She currently denies any chest pain.  Insulin Resistance Wanda Peters has a diagnosis of insulin resistance based on her elevated fasting insulin level >5. Although Wanda Peters's blood glucose readings are still under good control, insulin resistance puts her at greater risk of metabolic syndrome and diabetes. She is doing well with diet and metformin, and denies nausea, vomiting, or hypoglycemia. She continues to work on exercise to decrease risk of diabetes.  Depression with emotional eating behaviors Wanda Peters's mood is stable and she notes decrease in emotional eating. She denies insomnia and her blood pressure is stable. Wanda Peters struggles with emotional eating and using food for comfort to the extent  that it is negatively impacting her health. She often snacks when she is not hungry. Wanda Peters sometimes feels she is out of control and then feels guilty that she made poor food choices. She has been working on behavior modification techniques to help reduce her emotional eating and has been somewhat successful. She shows no sign of suicidal or homicidal ideations.  Depression screen [facility] 2/9 08/26/2017 06/30/2017  Decreased Interest 1 0  Down, Depressed, Hopeless 1 0  PHQ - 2 Score 2 0  Altered sleeping 2 -  Tired, decreased energy 3 -  Change in appetite 2 -  Feeling bad or failure about yourself  1 -  Trouble concentrating 0 -  Moving slowly or fidgety/restless 0 -  Suicidal thoughts 0 -  PHQ-9 Score 10 -  Difficult doing work/chores Not difficult at all -    ALLERGIES: Allergies  Allergen Reactions  . Sulfonamide Derivatives     REACTION: rash    MEDICATIONS: Current Outpatient Medications on File Prior to Visit  Medication Sig Dispense Refill  . Cholecalciferol (VITAMIN D3) 50000 units CAPS Take 1 capsule by mouth every 7 (seven) days. 12 capsule 0  . fexofenadine (ALLEGRA) 180 MG tablet Take 180 mg by mouth daily.    . fluticasone (FLONASE) 50 MCG/ACT nasal spray Place 1 spray into both nostrils daily.     No current facility-administered medications on file prior to visit.     PAST MEDICAL HISTORY: Past Medical History:  Diagnosis Date  . Dry skin   . Fatigue   . Rupture of ovarian cyst   . Seasonal allergies   .  Swelling of extremity   . Trouble in sleeping   . Vitamin D deficiency     PAST SURGICAL HISTORY: No past surgical history on file.  SOCIAL HISTORY: Social History   Tobacco Use  . Smoking status: Never Smoker  . Smokeless tobacco: Never Used  Substance Use Topics  . Alcohol use: Yes    Alcohol/week: 0.0 standard drinks    Comment: socially  . Drug use: No    FAMILY HISTORY: Family History  Problem Relation Age of Onset  . Cancer Mother     . Obesity Father     ROS: Review of Systems  Constitutional: Positive for weight loss.  Cardiovascular: Negative for chest pain.  Gastrointestinal: Negative for nausea and vomiting.  Endo/Heme/Allergies:       Negative hypoglycemia  Psychiatric/Behavioral: Positive for depression. Negative for suicidal ideas. The patient does not have insomnia.     PHYSICAL EXAM: Blood pressure 100/69, pulse 63, temperature 98.2 F (36.8 C), temperature source Oral, height 5\' 8"  (1.727 m), weight 270 lb (122.5 kg), last menstrual period 03/01/2018, SpO2 96 %. Body mass index is 41.05 kg/m. Physical Exam  Constitutional: She is oriented to person, place, and time. She appears well-developed and well-nourished.  Cardiovascular: Normal rate.  Pulmonary/Chest: Effort normal.  Musculoskeletal: Normal range of motion.  Neurological: She is oriented to person, place, and time.  Skin: Skin is warm and dry.  Psychiatric: She has a normal mood and affect. Her behavior is normal.  Vitals reviewed.   RECENT LABS AND TESTS: BMET    Component Value Date/Time   NA 137 12/21/2017 0802   K 4.4 12/21/2017 0802   CL 105 12/21/2017 0802   CO2 18 (L) 12/21/2017 0802   GLUCOSE 96 12/21/2017 0802   GLUCOSE 95 06/25/2017 0854   BUN 17 12/21/2017 0802   CREATININE 0.75 12/21/2017 0802   CREATININE 0.63 12/01/2014 1532   CALCIUM 9.2 12/21/2017 0802   GFRNONAA 97 12/21/2017 0802   GFRAA 112 12/21/2017 0802   Lab Results  Component Value Date   HGBA1C 5.3 12/21/2017   HGBA1C 5.5 08/26/2017   HGBA1C 5.3 05/12/2008   Lab Results  Component Value Date   INSULIN 8.6 12/21/2017   INSULIN 10.3 08/26/2017   CBC    Component Value Date/Time   WBC 5.9 07/14/2017 0905   RBC 4.19 07/14/2017 0905   HGB 13.4 07/14/2017 0905   HCT 39.5 07/14/2017 0905   PLT 227.0 07/14/2017 0905   MCV 94.3 07/14/2017 0905   MCH 31.2 12/01/2014 1532   MCHC 33.8 07/14/2017 0905   RDW 13.1 07/14/2017 0905   LYMPHSABS 1.8  07/14/2017 0905   MONOABS 0.4 07/14/2017 0905   EOSABS 0.1 07/14/2017 0905   BASOSABS 0.0 07/14/2017 0905   Iron/TIBC/Ferritin/ %Sat No results found for: IRON, TIBC, FERRITIN, IRONPCTSAT Lipid Panel     Component Value Date/Time   CHOL 184 12/21/2017 0802   TRIG 83 12/21/2017 0802   HDL 53 12/21/2017 0802   CHOLHDL 3 06/25/2017 0854   VLDL 10.8 06/25/2017 0854   LDLCALC 114 (H) 12/21/2017 0802   Hepatic Function Panel     Component Value Date/Time   PROT 6.8 12/21/2017 0802   ALBUMIN 4.1 12/21/2017 0802   AST 13 12/21/2017 0802   ALT 12 12/21/2017 0802   ALKPHOS 85 12/21/2017 0802   BILITOT <0.2 12/21/2017 0802   BILIDIR 0.1 05/12/2008 0942      Component Value Date/Time   TSH 4.03 07/14/2017 0905  TSH 1.688 12/01/2014 1532   TSH 1.93 05/12/2008 0942    ASSESSMENT AND PLAN: Prediabetes - Plan: metFORMIN (GLUCOPHAGE) 500 MG tablet  Essential hypertension - Plan: hydrochlorothiazide (HYDRODIURIL) 25 MG tablet  Other depression - with emotional eating - Plan: buPROPion (WELLBUTRIN SR) 150 MG 12 hr tablet  At risk for heart disease  Class 3 severe obesity with serious comorbidity and body mass index (BMI) of 40.0 to 44.9 in adult, unspecified obesity type ([place])  PLAN:  Hypertension We discussed sodium restriction, working on healthy weight loss, and a regular exercise program as the means to achieve improved blood pressure control. Wanda Peters agreed with this plan and agreed to follow up as directed. We will continue to monitor her blood pressure as well as her progress with the above lifestyle modifications. Wanda Peters agrees to hydrochlorothiazide 25 mg qd #30 and we will refill for 1 month. She will watch for signs of hypotension as she continues her lifestyle modifications. We will recheck labs at next visit. Wanda Peters agrees to follow up with our clinic in 3 weeks.  Cardiovascular risk counselling Wanda Peters was given extended (15 minutes) coronary artery disease  prevention counseling today. She is 42 y.o. female and has risk factors for heart disease including obesity and hypertension. We discussed intensive lifestyle modifications today with an emphasis on specific weight loss instructions and strategies. Pt was also informed of the importance of increasing exercise and decreasing saturated fats to help prevent heart disease.  Insulin Resistance Wanda Peters will continue to work on weight loss, exercise, and decreasing simple carbohydrates in her diet to help decrease the risk of diabetes. We dicussed metformin including benefits and risks. She was informed that eating too many simple carbohydrates or too many calories at one sitting increases the likelihood of GI side effects. Sundi agrees to continue taking metformin 500 mg BID #60 and we will refill for 1 month. We will recheck labs at next visit. Wanda Peters agrees to follow up with our clinic in 3 weeks as directed to monitor her progress.  Depression with Emotional Eating Behaviors We discussed behavior modification techniques today to help Wanda Peters deal with her emotional eating and depression. Wanda Peters agrees to continue taking bupropion SR 150 mg qd #30 and we will refill for 1 month. Wanda Peters agrees to follow up with our clinic in 3 weeks.  Obesity Wanda Peters is currently in the action stage of change. As such, her goal is to continue with weight loss efforts She has agreed to keep a food journal with 1500-1600 calories and 80 grams of protein daily Tylie has been instructed to work up to a goal of 150 minutes of combined cardio and strengthening exercise per week for weight loss and overall health benefits. We discussed the following Behavioral Modification Strategies today: increasing lean protein intake, decreasing simple carbohydrates, work on meal planning and easy cooking plans and dealing with family or coworker sabotage   Gertude has agreed to follow up with our clinic in 3 weeks.  She was informed of the importance of frequent follow up visits to maximize her success with intensive lifestyle modifications for her multiple health conditions.   OBESITY BEHAVIORAL INTERVENTION VISIT  Today's visit was # 12   Starting weight: 281 lbs Starting date: 08/26/17 Today's weight : 270 lbs Today's date: 03/30/2018 Total lbs lost to date: 64    ASK: We discussed the diagnosis of obesity with [name] today and Kameah agreed to give [name] permission to discuss obesity behavioral modification therapy today.  ASSESS: Athalene has the diagnosis of obesity and her BMI today is 41.06 Murline is in the action stage of change   ADVISE: Frimy was educated on the multiple health risks of obesity as well as the benefit of weight loss to improve her health. She was advised of the need for long term treatment and the importance of lifestyle modifications to improve her current health and to decrease her risk of future health problems.  AGREE: Multiple dietary modification options and treatment options were discussed and  Tessi agreed to follow the recommendations documented in the above note.  ARRANGE: Sydnee was educated on the importance of frequent visits to treat obesity as outlined per CMS and USPSTF guidelines and agreed to schedule her next follow up appointment today.  I, Trixie Dredge, am acting as transcriptionist for Dennard Nip, MD  I have reviewed the above documentation for accuracy and completeness, and I agree with the above. -Dennard Nip, MD

## 2018-04-02 MED FILL — BUPROPION SR 150 MG TABLET: 150 | 30 days supply | Qty: 30 | Fill #0

## 2018-04-02 MED FILL — metFORMIN HCL 500 MG TABS: 500 | 30 days supply | Qty: 60 | Fill #0

## 2018-04-06 ENCOUNTER — Encounter (INDEPENDENT_AMBULATORY_CARE_PROVIDER_SITE_OTHER): Payer: Self-pay | Admitting: Family Medicine

## 2018-04-09 ENCOUNTER — Telehealth: Payer: 59 | Admitting: Family

## 2018-04-09 DIAGNOSIS — B9689 Other specified bacterial agents as the cause of diseases classified elsewhere: Secondary | ICD-10-CM | POA: Diagnosis not present

## 2018-04-09 DIAGNOSIS — J329 Chronic sinusitis, unspecified: Secondary | ICD-10-CM | POA: Diagnosis not present

## 2018-04-09 MED ORDER — AMOXICILLIN-POT CLAVULANATE 875-125 MG PO TABS: 1.0000 | ORAL_TABLET | Freq: Two times a day (BID) | ORAL | 0 refills | Status: AC

## 2018-04-09 NOTE — Progress Notes (Signed)

## 2018-04-19 ENCOUNTER — Ambulatory Visit (INDEPENDENT_AMBULATORY_CARE_PROVIDER_SITE_OTHER): Payer: 59 | Admitting: Family Medicine

## 2018-04-19 VITALS — BP 104/70 | HR 70 | Temp 97.7°F | Ht 68.0 in | Wt 271.0 lb

## 2018-04-19 DIAGNOSIS — Z6841 Body Mass Index (BMI) 40.0 and over, adult: Secondary | ICD-10-CM | POA: Diagnosis not present

## 2018-04-19 DIAGNOSIS — Z9189 Other specified personal risk factors, not elsewhere classified: Secondary | ICD-10-CM

## 2018-04-19 DIAGNOSIS — E8881 Metabolic syndrome: Secondary | ICD-10-CM | POA: Diagnosis not present

## 2018-04-19 DIAGNOSIS — E559 Vitamin D deficiency, unspecified: Secondary | ICD-10-CM

## 2018-04-20 LAB — INSULIN, RANDOM: INSULIN: 7.9 u[IU]/mL (ref 2.6–24.9)

## 2018-04-20 LAB — CBC WITH DIFFERENTIAL/PLATELET
BASOS ABS: 0.1 10*3/uL (ref 0.0–0.2)
Basos: 1 %
EOS (ABSOLUTE): 0.6 10*3/uL — AB (ref 0.0–0.4)
Eos: 8 %
HEMOGLOBIN: 13.8 g/dL (ref 11.1–15.9)
Hematocrit: 41.4 % (ref 34.0–46.6)
Immature Grans (Abs): 0 10*3/uL (ref 0.0–0.1)
Immature Granulocytes: 0 %
LYMPHS: 26 %
Lymphocytes Absolute: 1.9 10*3/uL (ref 0.7–3.1)
MCH: 31.5 pg (ref 26.6–33.0)
MCHC: 33.3 g/dL (ref 31.5–35.7)
MCV: 95 fL (ref 79–97)
Monocytes Absolute: 0.6 10*3/uL (ref 0.1–0.9)
Monocytes: 8 %
NEUTROS ABS: 4.2 10*3/uL (ref 1.4–7.0)
NEUTROS PCT: 57 %
PLATELETS: 262 10*3/uL (ref 150–450)
RBC: 4.38 x10E6/uL (ref 3.77–5.28)
RDW: 12.2 % — ABNORMAL LOW (ref 12.3–15.4)
WBC: 7.5 10*3/uL (ref 3.4–10.8)

## 2018-04-20 LAB — COMPREHENSIVE METABOLIC PANEL
ALBUMIN: 4.2 g/dL (ref 3.5–5.5)
ALT: 19 IU/L (ref 0–32)
AST: 15 IU/L (ref 0–40)
Albumin/Globulin Ratio: 1.6 (ref 1.2–2.2)
Alkaline Phosphatase: 99 IU/L (ref 39–117)
BUN/Creatinine Ratio: 22 (ref 9–23)
BUN: 18 mg/dL (ref 6–24)
Bilirubin Total: 0.3 mg/dL (ref 0.0–1.2)
CHLORIDE: 102 mmol/L (ref 96–106)
CO2: 22 mmol/L (ref 20–29)
CREATININE: 0.83 mg/dL (ref 0.57–1.00)
Calcium: 9.2 mg/dL (ref 8.7–10.2)
GFR calc Af Amer: 99 mL/min/{1.73_m2} (ref >59)
GFR calc non Af Amer: 86 mL/min/{1.73_m2} (ref >59)
GLUCOSE: 88 mg/dL (ref 65–99)
Globulin, Total: 2.7 g/dL (ref 1.5–4.5)
POTASSIUM: 4.3 mmol/L (ref 3.5–5.2)
Sodium: 138 mmol/L (ref 134–144)
TOTAL PROTEIN: 6.9 g/dL (ref 6.0–8.5)

## 2018-04-20 LAB — LIPID PANEL
Chol/HDL Ratio: 3.7 ratio (ref 0.0–4.4)
Cholesterol, Total: 192 mg/dL (ref 100–199)
HDL: 52 mg/dL (ref >39)
LDL CALC: 121 mg/dL — AB (ref 0–99)
Triglycerides: 96 mg/dL (ref 0–149)
VLDL Cholesterol Cal: 19 mg/dL (ref 5–40)

## 2018-04-20 LAB — HEMOGLOBIN A1C
Est. average glucose Bld gHb Est-mCnc: 108 mg/dL
HEMOGLOBIN A1C: 5.4 % (ref 4.8–5.6)

## 2018-04-20 LAB — VITAMIN D 25 HYDROXY (VIT D DEFICIENCY, FRACTURES): Vit D, 25-Hydroxy: 47.1 ng/mL (ref 30.0–100.0)

## 2018-04-20 NOTE — Progress Notes (Signed)
Office: [phone]  /  Fax: [phone]   HPI:   Chief Complaint: OBESITY Wanda Peters is here to discuss her progress with her obesity treatment plan. She is keeping a food journal with 1500 calories and 100 grams of protein and is following her eating plan approximately 60 % of the time. She states she is exercising 0 minutes 0 times per week. Wanda Peters has been sick with a sinus infection and on antibiotics with increased nausea. She hasn't been eating as well as exercising.  Her weight is 271 lb (122.9 kg) today and has had a weight gain of 1 pound over a period of 3 weeks since her last visit. She has lost 10 lbs since starting treatment with [name].  Vitamin D deficiency Wanda Peters has a diagnosis of vitamin D deficiency. She is currently taking vit D and is stable. She denies vomiting or muscle weakness.  Insulin Resistance Wanda Peters has a diagnosis of insulin resistance based on her elevated fasting insulin level >5. Although Wanda Peters's blood glucose readings are still under good control, insulin resistance puts her at greater risk of metabolic syndrome and diabetes. She is taking metformin currently and is stable. She continues to work on diet and exercise to decrease risk of diabetes. She is due for labs today. She denies vomiting and muscle weakness.  At risk for diabetes Wanda Peters is at higher than average risk for developing diabetes due to her insulin resistance and obesity. She currently denies polyuria or polydipsia.  ALLERGIES: Allergies  Allergen Reactions  . Sulfonamide Derivatives     REACTION: rash    MEDICATIONS: Current Outpatient Medications on File Prior to Visit  Medication Sig Dispense Refill  . buPROPion (WELLBUTRIN SR) 150 MG 12 hr tablet Take 1 tablet (150 mg total) by mouth daily. 30 tablet 0  . Cholecalciferol (VITAMIN D3) 50000 units CAPS Take 1 capsule by mouth every 7 (seven) days. 12 capsule 0  . fexofenadine (ALLEGRA) 180 MG tablet Take 180 mg by  mouth daily.    . fluticasone (FLONASE) 50 MCG/ACT nasal spray Place 1 spray into both nostrils daily.    . hydrochlorothiazide (HYDRODIURIL) 25 MG tablet Take 1 tablet (25 mg total) by mouth daily. 30 tablet 0  . metFORMIN (GLUCOPHAGE) 500 MG tablet Take 1 tablet (500 mg total) by mouth 2 (two) times daily with a meal. 60 tablet 0   No current facility-administered medications on file prior to visit.     PAST MEDICAL HISTORY: Past Medical History:  Diagnosis Date  . Dry skin   . Fatigue   . Rupture of ovarian cyst   . Seasonal allergies   . Swelling of extremity   . Trouble in sleeping   . Vitamin D deficiency     PAST SURGICAL HISTORY: No past surgical history on file.  SOCIAL HISTORY: Social History   Tobacco Use  . Smoking status: Never Smoker  . Smokeless tobacco: Never Used  Substance Use Topics  . Alcohol use: Yes    Alcohol/week: 0.0 standard drinks    Comment: socially  . Drug use: No    FAMILY HISTORY: Family History  Problem Relation Age of Onset  . Cancer Mother   . Obesity Father     ROS: Review of Systems  Constitutional: Negative for weight loss.  Gastrointestinal: Positive for nausea. Negative for vomiting.  Genitourinary:       Negative for polyuria.  Musculoskeletal:       Negative for muscle weakness,  Endo/Heme/Allergies: Negative for polydipsia.  PHYSICAL EXAM: Blood pressure 104/70, pulse 70, temperature 97.7 F (36.5 C), temperature source Oral, height 5\' 8"  (1.727 m), weight 271 lb (122.9 kg), SpO2 98 %. Body mass index is 41.21 kg/m. Physical Exam  Constitutional: She is oriented to person, place, and time. She appears well-developed and well-nourished.  Cardiovascular: Normal rate.  Pulmonary/Chest: Effort normal.  Musculoskeletal: Normal range of motion.  Neurological: She is oriented to person, place, and time.  Skin: Skin is warm and dry.  Psychiatric: She has a normal mood and affect. Her behavior is normal.  Vitals  reviewed.   RECENT LABS AND TESTS: BMET    Component Value Date/Time   NA 138 04/19/2018 0834   K 4.3 04/19/2018 0834   CL 102 04/19/2018 0834   CO2 22 04/19/2018 0834   GLUCOSE 88 04/19/2018 0834   GLUCOSE 95 06/25/2017 0854   BUN 18 04/19/2018 0834   CREATININE 0.83 04/19/2018 0834   CREATININE 0.63 12/01/2014 1532   CALCIUM 9.2 04/19/2018 0834   GFRNONAA 86 04/19/2018 0834   GFRAA 99 04/19/2018 0834   Lab Results  Component Value Date   HGBA1C 5.4 04/19/2018   HGBA1C 5.3 12/21/2017   HGBA1C 5.5 08/26/2017   HGBA1C 5.3 05/12/2008   Lab Results  Component Value Date   INSULIN 7.9 04/19/2018   INSULIN 8.6 12/21/2017   INSULIN 10.3 08/26/2017   CBC    Component Value Date/Time   WBC 7.5 04/19/2018 0834   WBC 5.9 07/14/2017 0905   RBC 4.38 04/19/2018 0834   RBC 4.19 07/14/2017 0905   HGB 13.8 04/19/2018 0834   HCT 41.4 04/19/2018 0834   PLT 262 04/19/2018 0834   MCV 95 04/19/2018 0834   MCH 31.5 04/19/2018 0834   MCH 31.2 12/01/2014 1532   MCHC 33.3 04/19/2018 0834   MCHC 33.8 07/14/2017 0905   RDW 12.2 (L) 04/19/2018 0834   LYMPHSABS 1.9 04/19/2018 0834   MONOABS 0.4 07/14/2017 0905   EOSABS 0.6 (H) 04/19/2018 0834   BASOSABS 0.1 04/19/2018 0834   Iron/TIBC/Ferritin/ %Sat No results found for: IRON, TIBC, FERRITIN, IRONPCTSAT Lipid Panel     Component Value Date/Time   CHOL 192 04/19/2018 0834   TRIG 96 04/19/2018 0834   HDL 52 04/19/2018 0834   CHOLHDL 3.7 04/19/2018 0834   CHOLHDL 3 06/25/2017 0854   VLDL 10.8 06/25/2017 0854   LDLCALC 121 (H) 04/19/2018 0834   Hepatic Function Panel     Component Value Date/Time   PROT 6.9 04/19/2018 0834   ALBUMIN 4.2 04/19/2018 0834   AST 15 04/19/2018 0834   ALT 19 04/19/2018 0834   ALKPHOS 99 04/19/2018 0834   BILITOT 0.3 04/19/2018 0834   BILIDIR 0.1 05/12/2008 0942      Component Value Date/Time   TSH 4.03 07/14/2017 0905   TSH 1.688 12/01/2014 1532   TSH 1.93 05/12/2008 0942   Results for  Wanda, FLIPPEN (MRN [id number]) as of 04/20/2018 09:21  Ref. Range 04/19/2018 08:34  Vitamin D, 25-Hydroxy Latest Ref Range: 30.0 - 100.0 ng/mL 47.1   ASSESSMENT AND PLAN: Vitamin D deficiency - Plan: VITAMIN D 25 Hydroxy (Vit-D Deficiency, Fractures)  Insulin resistance - Plan: Comprehensive metabolic panel, CBC with Differential/Platelet, Hemoglobin A1c, Insulin, random, Lipid panel  At risk for diabetes mellitus  Class 3 severe obesity with serious comorbidity and body mass index (BMI) of 40.0 to 44.9 in adult, unspecified obesity type ([facility])  PLAN:  Vitamin D Deficiency Wanda Peters was informed that low vitamin D levels  contributes to fatigue and are associated with obesity, breast, and colon cancer. She agrees to continue to take prescription Vit D @50 ,000 IU every week and will follow up for routine testing of vitamin D, at least 2-3 times per year. She was informed of the risk of over-replacement of vitamin D and agrees to not increase her dose unless she discusses this with [name] first. We will check labs today. Wanda Peters agrees to follow up as directed in 3 to 4 weeks.  Pre-Diabetes Wanda Peters will continue to work on weight loss, exercise, and decreasing simple carbohydrates in her diet to help decrease the risk of diabetes. She was informed that eating too many simple carbohydrates or too many calories at one sitting increases the likelihood of GI side effects. Wanda Peters agrees to continue metformin and her diet and a prescription was not written today. We will check labs today. Wanda Peters agreed to follow up with [name] as directed to monitor her progress.  Diabetes risk counseling Wanda Peters was given extended (15 minutes) diabetes prevention counseling today. She is 42 y.o. female and has risk factors for diabetes including insulin resistance and obesity. We discussed intensive lifestyle modifications today with an emphasis on weight loss as well as increasing exercise and decreasing  simple carbohydrates in her diet.  Obesity Wanda Peters is currently in the action stage of change. As such, her goal is to continue with weight loss efforts. She has agreed to keep a food journal with 1500 calories and 100 grams of protein. Wanda Peters has been instructed to work up to a goal of 150 minutes of combined cardio and strengthening exercise per week for weight loss and overall health benefits. We discussed the following Behavioral Modification Strategies today: increasing lean protein intake and decreasing simple carbohydrates.   Wanda Peters has agreed to follow up with our clinic in 3 to 4 weeks. She was informed of the importance of frequent follow up visits to maximize her success with intensive lifestyle modifications for her multiple health conditions.   OBESITY BEHAVIORAL INTERVENTION VISIT  Today's visit was # 13   Starting weight: 281 lbs Starting date: 08/26/17 Today's weight : Weight: 271 lb (122.9 kg)  Today's date: 04/19/2018 Total lbs lost to date: 10  ASK: We discussed the diagnosis of obesity with Wanda Peters today and Wanda Peters agreed to give [name] permission to discuss obesity behavioral modification therapy today.  ASSESS: Wanda Peters has the diagnosis of obesity and her BMI today is 41.22. Wanda Peters is in the action stage of change.   ADVISE: Wanda Peters was educated on the multiple health risks of obesity as well as the benefit of weight loss to improve her health. She was advised of the need for long term treatment and the importance of lifestyle modifications to improve her current health and to decrease her risk of future health problems.  AGREE: Multiple dietary modification options and treatment options were discussed and Wanda Peters agreed to follow the recommendations documented in the above note.  ARRANGE: Wanda Peters was educated on the importance of frequent visits to treat obesity as outlined per CMS and USPSTF guidelines and agreed to schedule  her next follow up appointment today.  I, Marcille Blanco, am acting as transcriptionist for Starlyn Skeans, MD  I have reviewed the above documentation for accuracy and completeness, and I agree with the above. -Dennard Nip, MD

## 2018-04-26 ENCOUNTER — Encounter (INDEPENDENT_AMBULATORY_CARE_PROVIDER_SITE_OTHER): Payer: Self-pay | Admitting: Family Medicine

## 2018-04-26 ENCOUNTER — Other Ambulatory Visit (INDEPENDENT_AMBULATORY_CARE_PROVIDER_SITE_OTHER): Payer: Self-pay

## 2018-04-26 DIAGNOSIS — I10 Essential (primary) hypertension: Secondary | ICD-10-CM

## 2018-04-26 DIAGNOSIS — R7303 Prediabetes: Secondary | ICD-10-CM

## 2018-04-26 DIAGNOSIS — F3289 Other specified depressive episodes: Secondary | ICD-10-CM

## 2018-04-26 MED ORDER — HYDROCHLOROTHIAZIDE 25 MG PO TABS: 25.0000 mg | ORAL_TABLET | Freq: Every day | ORAL | 0 refills | Status: DC

## 2018-04-26 MED ORDER — BUPROPION HCL ER (SR) 150 MG PO TB12: 150.0000 mg | ORAL_TABLET | Freq: Every day | ORAL | 0 refills | Status: DC

## 2018-04-26 MED ORDER — METFORMIN HCL 500 MG PO TABS: 500.0000 mg | ORAL_TABLET | Freq: Two times a day (BID) | ORAL | 0 refills | Status: DC

## 2018-05-05 MED FILL — metFORMIN HCL 500 MG TABS: 500 | 30 days supply | Qty: 60 | Fill #0

## 2018-05-05 MED FILL — BUPROPION SR 150 MG TABLET: 150 | 30 days supply | Qty: 30 | Fill #0

## 2018-05-05 MED FILL — HYDROCHLOROTHIAZIDE 25 MG T: 25 | 30 days supply | Qty: 30 | Fill #0

## 2018-05-12 ENCOUNTER — Ambulatory Visit (INDEPENDENT_AMBULATORY_CARE_PROVIDER_SITE_OTHER): Payer: 59 | Admitting: Family Medicine

## 2018-05-12 VITALS — BP 109/74 | HR 62 | Temp 98.3°F | Ht 68.0 in | Wt 268.0 lb

## 2018-05-12 DIAGNOSIS — E559 Vitamin D deficiency, unspecified: Secondary | ICD-10-CM | POA: Diagnosis not present

## 2018-05-12 DIAGNOSIS — Z9189 Other specified personal risk factors, not elsewhere classified: Secondary | ICD-10-CM | POA: Diagnosis not present

## 2018-05-12 DIAGNOSIS — Z6841 Body Mass Index (BMI) 40.0 and over, adult: Secondary | ICD-10-CM | POA: Diagnosis not present

## 2018-05-17 ENCOUNTER — Encounter (INDEPENDENT_AMBULATORY_CARE_PROVIDER_SITE_OTHER): Payer: Self-pay | Admitting: Family Medicine

## 2018-05-17 MED ORDER — VITAMIN D (ERGOCALCIFEROL) 1.25 MG (50000 UNIT) PO CAPS: 50000.0000 [IU] | ORAL_CAPSULE | ORAL | 0 refills | Status: DC

## 2018-05-18 NOTE — Progress Notes (Signed)
Office: [phone]  /  Fax: [phone]   HPI:   Chief Complaint: OBESITY Wanda Peters is here to discuss her progress with her obesity treatment plan. She is keeping a food journal with 1500 calories and 100 grams of protein and is following her eating plan approximately 50 % of the time. She states she is exercising 0 minutes 0 times per week. Wanda Peters continues to do well with weight loss even over Thanksgiving. She worked on [employer]. She was also being mindful and avoiding extra temptations. Her hunger is controlled.  Her weight is 268 lb (121.6 kg) today and has had a weight loss of 3 pounds over a period of 3 weeks since her last visit. She has lost 13 lbs since starting treatment with [name].  Vitamin D deficiency Wanda Peters has a diagnosis of vitamin D deficiency. She is currently taking vit D and is stable, but not yet at goal. She denies nausea, vomiting, or muscle weakness.  At risk for osteopenia and osteoporosis Wanda Peters is at higher risk of osteopenia and osteoporosis due to vitamin D deficiency.   ALLERGIES: Allergies  Allergen Reactions  . Sulfonamide Derivatives     REACTION: rash    MEDICATIONS: Current Outpatient Medications on File Prior to Visit  Medication Sig Dispense Refill  . buPROPion (WELLBUTRIN SR) 150 MG 12 hr tablet Take 1 tablet (150 mg total) by mouth daily. 30 tablet 0  . Cholecalciferol (VITAMIN D3) 50000 units CAPS Take 1 capsule by mouth every 7 (seven) days. 12 capsule 0  . fexofenadine (ALLEGRA) 180 MG tablet Take 180 mg by mouth daily.    . fluticasone (FLONASE) 50 MCG/ACT nasal spray Place 1 spray into both nostrils daily.    . hydrochlorothiazide (HYDRODIURIL) 25 MG tablet Take 1 tablet (25 mg total) by mouth daily. 30 tablet 0  . metFORMIN (GLUCOPHAGE) 500 MG tablet Take 1 tablet (500 mg total) by mouth 2 (two) times daily with a meal. 60 tablet 0   No current facility-administered medications on file prior to  visit.     PAST MEDICAL HISTORY: Past Medical History:  Diagnosis Date  . Dry skin   . Fatigue   . Rupture of ovarian cyst   . Seasonal allergies   . Swelling of extremity   . Trouble in sleeping   . Vitamin D deficiency     PAST SURGICAL HISTORY: History reviewed. No pertinent surgical history.  SOCIAL HISTORY: Social History   Tobacco Use  . Smoking status: Never Smoker  . Smokeless tobacco: Never Used  Substance Use Topics  . Alcohol use: Yes    Alcohol/week: 0.0 standard drinks    Comment: socially  . Drug use: No    FAMILY HISTORY: Family History  Problem Relation Age of Onset  . Cancer Mother   . Obesity Father     ROS: Review of Systems  Constitutional: Positive for weight loss.  Gastrointestinal: Negative for nausea and vomiting.  Musculoskeletal:       Negative for muscle weakness.    PHYSICAL EXAM: Blood pressure 109/74, pulse 62, temperature 98.3 F (36.8 C), temperature source Oral, height 5\' 8"  (1.727 m), weight 268 lb (121.6 kg), last menstrual period 04/28/2018, SpO2 96 %. Body mass index is 40.75 kg/m. Physical Exam  Constitutional: She is oriented to person, place, and time. She appears well-developed and well-nourished.  Cardiovascular: Normal rate.  Pulmonary/Chest: Effort normal.  Musculoskeletal: Normal range of motion.  Neurological: She is oriented to person, place, and  time.  Skin: Skin is warm and dry.  Psychiatric: She has a normal mood and affect. Her behavior is normal.  Vitals reviewed.   RECENT LABS AND TESTS: BMET    Component Value Date/Time   NA 138 04/19/2018 0834   K 4.3 04/19/2018 0834   CL 102 04/19/2018 0834   CO2 22 04/19/2018 0834   GLUCOSE 88 04/19/2018 0834   GLUCOSE 95 06/25/2017 0854   BUN 18 04/19/2018 0834   CREATININE 0.83 04/19/2018 0834   CREATININE 0.63 12/01/2014 1532   CALCIUM 9.2 04/19/2018 0834   GFRNONAA 86 04/19/2018 0834   GFRAA 99 04/19/2018 0834   Lab Results  Component Value  Date   HGBA1C 5.4 04/19/2018   HGBA1C 5.3 12/21/2017   HGBA1C 5.5 08/26/2017   HGBA1C 5.3 05/12/2008   Lab Results  Component Value Date   INSULIN 7.9 04/19/2018   INSULIN 8.6 12/21/2017   INSULIN 10.3 08/26/2017   CBC    Component Value Date/Time   WBC 7.5 04/19/2018 0834   WBC 5.9 07/14/2017 0905   RBC 4.38 04/19/2018 0834   RBC 4.19 07/14/2017 0905   HGB 13.8 04/19/2018 0834   HCT 41.4 04/19/2018 0834   PLT 262 04/19/2018 0834   MCV 95 04/19/2018 0834   MCH 31.5 04/19/2018 0834   MCH 31.2 12/01/2014 1532   MCHC 33.3 04/19/2018 0834   MCHC 33.8 07/14/2017 0905   RDW 12.2 (L) 04/19/2018 0834   LYMPHSABS 1.9 04/19/2018 0834   MONOABS 0.4 07/14/2017 0905   EOSABS 0.6 (H) 04/19/2018 0834   BASOSABS 0.1 04/19/2018 0834   Iron/TIBC/Ferritin/ %Sat No results found for: IRON, TIBC, FERRITIN, IRONPCTSAT Lipid Panel     Component Value Date/Time   CHOL 192 04/19/2018 0834   TRIG 96 04/19/2018 0834   HDL 52 04/19/2018 0834   CHOLHDL 3.7 04/19/2018 0834   CHOLHDL 3 06/25/2017 0854   VLDL 10.8 06/25/2017 0854   LDLCALC 121 (H) 04/19/2018 0834   Hepatic Function Panel     Component Value Date/Time   PROT 6.9 04/19/2018 0834   ALBUMIN 4.2 04/19/2018 0834   AST 15 04/19/2018 0834   ALT 19 04/19/2018 0834   ALKPHOS 99 04/19/2018 0834   BILITOT 0.3 04/19/2018 0834   BILIDIR 0.1 05/12/2008 0942      Component Value Date/Time   TSH 4.03 07/14/2017 0905   TSH 1.688 12/01/2014 1532   TSH 1.93 05/12/2008 0942   Results for BRYNLEI, KLAUSNER (MRN [id number]) as of 05/18/2018 06:35  Ref. Range 04/19/2018 08:34  Vitamin D, 25-Hydroxy Latest Ref Range: 30.0 - 100.0 ng/mL 47.1   ASSESSMENT AND PLAN: Vitamin D deficiency - Plan: Vitamin D, Ergocalciferol, (DRISDOL) 1.25 MG (50000 UT) CAPS capsule  At risk for osteoporosis  Class 3 severe obesity with serious comorbidity and body mass index (BMI) of 40.0 to 44.9 in adult, unspecified obesity type  ([place])  PLAN:  Vitamin D Deficiency Wanda Peters was informed that low vitamin D levels contributes to fatigue and are associated with obesity, breast, and colon cancer. She agrees to continue to take prescription Vit D @50 ,000 IU every week #4 with no refills and will follow up for routine testing of vitamin D, at least 2-3 times per year. She was informed of the risk of over-replacement of vitamin D and agrees to not increase her dose unless she discusses this with [name] first. Latonia agrees to follow up in 3 to 4 weeks.  At risk for osteopenia and osteoporosis Wanda Peters was  given extended (15 minutes) osteoporosis prevention counseling today. Wanda Peters is at risk for osteopenia and osteoporosis due to her vitamin D deficiency. She was encouraged to take her vitamin D and follow her higher calcium diet and increase strengthening exercise to help strengthen her bones and decrease her risk of osteopenia and osteoporosis.  Obesity Wanda Peters is currently in the action stage of change. As such, her goal is to continue with weight loss efforts. She has agreed to keep a food journal with 1500 calories and 100 grams of protein.  Wanda Peters has been instructed to work up to a goal of 150 minutes of combined cardio and strengthening exercise per week for weight loss and overall health benefits. We discussed the following Behavioral Modification Strategies today: increasing lean protein intake, decreasing simple carbohydrates, work on meal planning and easy cooking plans, holiday eating strategies, and celebration eating strategies.  Wanda Peters has agreed to follow up with our clinic in 3 to 4 weeks. She was informed of the importance of frequent follow up visits to maximize her success with intensive lifestyle modifications for her multiple health conditions.   OBESITY BEHAVIORAL INTERVENTION VISIT  Today's visit was # 14   Starting weight: 281 lbs Starting date: 08/26/17 Today's weight : Weight: 268 lb  (121.6 kg)  Today's date: 05/12/2018 Total lbs lost to date: 13  ASK: We discussed the diagnosis of obesity with Wanda Peters today and Wanda Peters agreed to give [name] permission to discuss obesity behavioral modification therapy today.  ASSESS: Wanda Peters has the diagnosis of obesity and her BMI today is 40.75. Wanda Peters is in the action stage of change.   ADVISE: Wanda Peters was educated on the multiple health risks of obesity as well as the benefit of weight loss to improve her health. She was advised of the need for long term treatment and the importance of lifestyle modifications to improve her current health and to decrease her risk of future health problems.  AGREE: Multiple dietary modification options and treatment options were discussed and Wanda Peters agreed to follow the recommendations documented in the above note.  ARRANGE: Wanda Peters was educated on the importance of frequent visits to treat obesity as outlined per CMS and USPSTF guidelines and agreed to schedule her next follow up appointment today.  I, Marcille Blanco, am acting as transcriptionist for Starlyn Skeans, MD  I have reviewed the above documentation for accuracy and completeness, and I agree with the above. -Dennard Nip, MD

## 2018-05-26 ENCOUNTER — Encounter (INDEPENDENT_AMBULATORY_CARE_PROVIDER_SITE_OTHER): Payer: Self-pay | Admitting: Family Medicine

## 2018-05-26 ENCOUNTER — Ambulatory Visit (INDEPENDENT_AMBULATORY_CARE_PROVIDER_SITE_OTHER): Payer: 59 | Admitting: Family Medicine

## 2018-05-26 VITALS — BP 116/75 | HR 64 | Temp 98.1°F | Ht 68.0 in | Wt 270.0 lb

## 2018-05-26 DIAGNOSIS — I10 Essential (primary) hypertension: Secondary | ICD-10-CM

## 2018-05-26 DIAGNOSIS — K5909 Other constipation: Secondary | ICD-10-CM

## 2018-05-26 DIAGNOSIS — Z6841 Body Mass Index (BMI) 40.0 and over, adult: Secondary | ICD-10-CM

## 2018-05-26 DIAGNOSIS — E8881 Metabolic syndrome: Secondary | ICD-10-CM | POA: Diagnosis not present

## 2018-05-26 DIAGNOSIS — F3289 Other specified depressive episodes: Secondary | ICD-10-CM | POA: Diagnosis not present

## 2018-05-26 MED ORDER — BUPROPION HCL ER (SR) 150 MG PO TB12: 150.0000 mg | ORAL_TABLET | Freq: Every day | ORAL | 0 refills | Status: DC

## 2018-05-26 MED ORDER — HYDROCHLOROTHIAZIDE 25 MG PO TABS: 25.0000 mg | ORAL_TABLET | Freq: Every day | ORAL | 0 refills | Status: DC

## 2018-05-26 MED ORDER — POLYETHYLENE GLYCOL 3350 17 GM/SCOOP PO POWD: 17.0000 g | Freq: Every day | ORAL | 0 refills | Status: DC

## 2018-05-26 MED ORDER — METFORMIN HCL 500 MG PO TABS: 500.0000 mg | ORAL_TABLET | Freq: Two times a day (BID) | ORAL | 0 refills | Status: DC

## 2018-05-27 NOTE — Progress Notes (Signed)
Office: [phone]  /  Fax: [phone]   HPI:   Chief Complaint: OBESITY Wanda Peters is here to discuss her progress with her obesity treatment plan. She is keeping a food journal with 1500 calories and 100+ grams of protein and is following her eating plan approximately 50 % of the time. She states she is exercising 0 minutes 0 times per week. [name] is retaining some fluid and notes feeling constipated today. Otherwise, she has done well with portion control and smarter choices. Her hunger is controlled, but meal planning has been difficult and there has been increased eating out.  Her weight is 270 lb (122.5 kg) today and has had a weight gain of 2 pounds over a period of 2 weeks since her last visit. She has lost 11 lbs since starting treatment with [name].  Constipation Antinette notes decreased bowel movements frequently, worse in the last month. She has taken a laxative OTC, but it gave her abdominal cramping and diarrhea.  Depression with emotional eating behaviors [name] is struggling with emotional eating and using food for comfort to the extent that it is negatively impacting her health. She often snacks when she is not hungry. [name] sometimes feels she is out of control and then feels guilty that she made poor food choices. She has been working on behavior modification techniques to help reduce her emotional eating and has been somewhat successful. Her mood is stable on Wellbutrin and her blood pressure is stable. She is working on [employer]. She denies insomnia.  Hypertension [name] is a 42 y.o. female with hypertension. [name]'s blood pressure is stable on medications. She is working weight loss to help control her blood pressure with the goal of decreasing her risk of heart attack and stroke. [name] denies nausea, vomiting, and muscle weakness.  Insulin Resistance [name] has a diagnosis of insulin resistance based on her  elevated fasting insulin level >5. Although [name]'s blood glucose readings are still under good control, insulin resistance puts her at greater risk of metabolic syndrome and diabetes. She is stable on metformin and is working on diet and weight loss to decrease risk of diabetes. She denies nausea, vomiting, and hypoglycemia.  ASSESSMENT AND PLAN:  Essential hypertension - Plan: hydrochlorothiazide (HYDRODIURIL) 25 MG tablet  Insulin resistance - Plan: metFORMIN (GLUCOPHAGE) 500 MG tablet  Other constipation - Plan: polyethylene glycol powder (GLYCOLAX/MIRALAX) powder  Other depression - with emotional eating - Plan: buPROPion (WELLBUTRIN SR) 150 MG 12 hr tablet  Class 3 severe obesity with serious comorbidity and body mass index (BMI) of 40.0 to 44.9 in adult, unspecified obesity type ([place])  PLAN:  Constipation [name] was informed decrease bowel movement frequency is normal while losing weight, but stools should not be hard or painful. She was advised to increase her H20 intake and work on increasing her fiber intake. High fiber foods were discussed today. She agreed to start Miralax 17 grams qd with no refills. [name] will follow up in 3 to 4 weeks.  Depression with Emotional Eating Behaviors We discussed behavior modification techniques today to help [name] deal with her emotional eating and depression. She has agreed to take Wellbutrin SR 150mg  qd and agreed to follow up as directed.  Hypertension We discussed sodium restriction, working on healthy weight loss, and a regular exercise program as the means to achieve improved blood pressure control. We will continue to monitor her blood pressure as well as her progress with the above lifestyle modifications. She will continue her  HCTZ 25mg  qd #30 with no refills and will watch for signs of hypotension as she continues her lifestyle modifications. [name] agreed with this plan and agreed to follow up as directed.  Insulin  Resistance [name] will continue to work on weight loss, exercise, and decreasing simple carbohydrates in her diet to help decrease the risk of diabetes. She was informed that eating too many simple carbohydrates or too many calories at one sitting increases the likelihood of GI side effects. [name] agreed to continue metformin 500mg  BID # 60 with no refills and prescription was written today. [name] agreed to follow up with [name] as directed to monitor her progress in 3 to 4 weeks.  Obesity [name] is currently in the action stage of change. As such, her goal is to continue with weight loss efforts. She has agreed to keep a food journal with 1500 calories and 100+ grams of protein.  [name] has been instructed to work up to a goal of 150 minutes of combined cardio and strengthening exercise per week for weight loss and overall health benefits. We discussed the following Behavioral Modification Strategies today: increasing fiber rich foods, decreasing sodium intake, decrease eating out. and work on meal planning and easy cooking plans.  [name] has agreed to follow up with our clinic in 3 to 4 weeks. She was informed of the importance of frequent follow up visits to maximize her success with intensive lifestyle modifications for her multiple health conditions.  ALLERGIES: Allergies  Allergen Reactions  . Sulfonamide Derivatives     REACTION: rash    MEDICATIONS: Current Outpatient Medications on File Prior to Visit  Medication Sig Dispense Refill  . Cholecalciferol (VITAMIN D3) 50000 units CAPS Take 1 capsule by mouth every 7 (seven) days. 12 capsule 0  . fexofenadine (ALLEGRA) 180 MG tablet Take 180 mg by mouth daily.    . fluticasone (FLONASE) 50 MCG/ACT nasal spray Place 1 spray into both nostrils daily.    . Vitamin D, Ergocalciferol, (DRISDOL) 1.25 MG (50000 UT) CAPS capsule Take 1 capsule (50,000 Units total) by mouth every 7 (seven) days. 4 capsule 0   No current  facility-administered medications on file prior to visit.     PAST MEDICAL HISTORY: Past Medical History:  Diagnosis Date  . Dry skin   . Fatigue   . Rupture of ovarian cyst   . Seasonal allergies   . Swelling of extremity   . Trouble in sleeping   . Vitamin D deficiency     PAST SURGICAL HISTORY: History reviewed. No pertinent surgical history.  SOCIAL HISTORY: Social History   Tobacco Use  . Smoking status: Never Smoker  . Smokeless tobacco: Never Used  Substance Use Topics  . Alcohol use: Yes    Alcohol/week: 0.0 standard drinks    Comment: socially  . Drug use: No    FAMILY HISTORY: Family History  Problem Relation Age of Onset  . Cancer Mother   . Obesity Father     ROS: Review of Systems  Constitutional: Negative for weight loss.  Gastrointestinal: Positive for constipation. Negative for nausea and vomiting.  Musculoskeletal:       Negative for muscle weakness.  Endo/Heme/Allergies:       Negative for hypoglycemia.  Psychiatric/Behavioral: Positive for depression. The patient does not have insomnia.    PHYSICAL EXAM: Blood pressure 116/75, pulse 64, temperature 98.1 F (36.7 C), temperature source Oral, height 5\' 8"  (1.727 m), weight 270 lb (122.5 kg), last menstrual period 04/28/2018, SpO2 96 %.  Body mass index is 41.05 kg/m. Physical Exam Vitals signs reviewed.  Constitutional:      Appearance: Normal appearance. She is obese.  Cardiovascular:     Rate and Rhythm: Normal rate.  Pulmonary:     Effort: Pulmonary effort is normal.  Musculoskeletal: Normal range of motion.  Skin:    General: Skin is warm and dry.  Neurological:     Mental Status: She is alert and oriented to person, place, and time.  Psychiatric:        Mood and Affect: Mood normal.        Behavior: Behavior normal.    RECENT LABS AND TESTS: BMET    Component Value Date/Time   NA 138 04/19/2018 0834   K 4.3 04/19/2018 0834   CL 102 04/19/2018 0834   CO2 22 04/19/2018  0834   GLUCOSE 88 04/19/2018 0834   GLUCOSE 95 06/25/2017 0854   BUN 18 04/19/2018 0834   CREATININE 0.83 04/19/2018 0834   CREATININE 0.63 12/01/2014 1532   CALCIUM 9.2 04/19/2018 0834   GFRNONAA 86 04/19/2018 0834   GFRAA 99 04/19/2018 0834   Lab Results  Component Value Date   HGBA1C 5.4 04/19/2018   HGBA1C 5.3 12/21/2017   HGBA1C 5.5 08/26/2017   HGBA1C 5.3 05/12/2008   Lab Results  Component Value Date   INSULIN 7.9 04/19/2018   INSULIN 8.6 12/21/2017   INSULIN 10.3 08/26/2017   CBC    Component Value Date/Time   WBC 7.5 04/19/2018 0834   WBC 5.9 07/14/2017 0905   RBC 4.38 04/19/2018 0834   RBC 4.19 07/14/2017 0905   HGB 13.8 04/19/2018 0834   HCT 41.4 04/19/2018 0834   PLT 262 04/19/2018 0834   MCV 95 04/19/2018 0834   MCH 31.5 04/19/2018 0834   MCH 31.2 12/01/2014 1532   MCHC 33.3 04/19/2018 0834   MCHC 33.8 07/14/2017 0905   RDW 12.2 (L) 04/19/2018 0834   LYMPHSABS 1.9 04/19/2018 0834   MONOABS 0.4 07/14/2017 0905   EOSABS 0.6 (H) 04/19/2018 0834   BASOSABS 0.1 04/19/2018 0834   Iron/TIBC/Ferritin/ %Sat No results found for: IRON, TIBC, FERRITIN, IRONPCTSAT Lipid Panel     Component Value Date/Time   CHOL 192 04/19/2018 0834   TRIG 96 04/19/2018 0834   HDL 52 04/19/2018 0834   CHOLHDL 3.7 04/19/2018 0834   CHOLHDL 3 06/25/2017 0854   VLDL 10.8 06/25/2017 0854   LDLCALC 121 (H) 04/19/2018 0834   Hepatic Function Panel     Component Value Date/Time   PROT 6.9 04/19/2018 0834   ALBUMIN 4.2 04/19/2018 0834   AST 15 04/19/2018 0834   ALT 19 04/19/2018 0834   ALKPHOS 99 04/19/2018 0834   BILITOT 0.3 04/19/2018 0834   BILIDIR 0.1 05/12/2008 0942      Component Value Date/Time   TSH 4.03 07/14/2017 0905   TSH 1.688 12/01/2014 1532   TSH 1.93 05/12/2008 0942   Results for KAMBRIA, GRIMA (MRN [id number]) as of 05/27/2018 06:07  Ref. Range 04/19/2018 08:34  Vitamin D, 25-Hydroxy Latest Ref Range: 30.0 - 100.0 ng/mL 47.1    OBESITY  BEHAVIORAL INTERVENTION VISIT  Today's visit was # 15 Starting weight:281 Starting date: 08/26/17 Today's weight : Weight: 270 lb (122.5 kg)  Today's date: 05/26/2018 Total lbs lost to date: 11  ASK: We discussed the diagnosis of obesity with [name] today and Nyaja agreed to give [name] permission to discuss obesity behavioral modification therapy today.  ASSESS: Florie has the diagnosis  of obesity and her BMI today is 41.06. Jasmina is in the action stage of change.   ADVISE: Beatric was educated on the multiple health risks of obesity as well as the benefit of weight loss to improve her health. She was advised of the need for long term treatment and the importance of lifestyle modifications to improve her current health and to decrease her risk of future health problems.  AGREE: Multiple dietary modification options and treatment options were discussed and Nusaiba agreed to follow the recommendations documented in the above note.  ARRANGE: Circe was educated on the importance of frequent visits to treat obesity as outlined per CMS and USPSTF guidelines and agreed to schedule her next follow up appointment today.  I, Marcille Blanco, am acting as transcriptionist for Starlyn Skeans, MD  I have reviewed the above documentation for accuracy and completeness, and I agree with the above. -Dennard Nip, MD

## 2018-05-29 MED FILL — metFORMIN HCL 500 MG TABS: 500 | 30 days supply | Qty: 60 | Fill #0

## 2018-05-29 MED FILL — BUPROPION HCL SR 150 MG TAB: 150 | 30 days supply | Qty: 30 | Fill #0

## 2018-05-31 MED FILL — HYDROCHLOROTHIAZIDE 25 MG T: 25 | 30 days supply | Qty: 30 | Fill #0

## 2018-06-22 ENCOUNTER — Telehealth: Payer: 59 | Admitting: Family

## 2018-06-22 DIAGNOSIS — R300 Dysuria: Secondary | ICD-10-CM | POA: Diagnosis not present

## 2018-06-22 DIAGNOSIS — N390 Urinary tract infection, site not specified: Secondary | ICD-10-CM

## 2018-06-22 MED ORDER — CEPHALEXIN 500 MG PO CAPS: 500.0000 mg | ORAL_CAPSULE | Freq: Two times a day (BID) | ORAL | 0 refills | Status: DC

## 2018-06-22 MED FILL — CEPHALEXIN 500 MG CAPSULE: 500 | 7 days supply | Qty: 14 | Fill #0

## 2018-06-22 NOTE — Addendum Note (Signed)
Addended by: [name] on: 06/22/2018 11:28 AM   Modules accepted: Orders

## 2018-06-22 NOTE — Progress Notes (Signed)
ADDENDUM: See pt message that she made an error in her selection.  Guadelupe Sabin, DNP, FNP-BC [facility] E-Visit Team  We are sorry that you are not feeling well.  Here is how we plan to help!  Based on what you shared with me it looks like you most likely have a simple urinary tract infection.  A UTI (Urinary Tract Infection) is a bacterial infection of the bladder.  Most cases of urinary tract infections are simple to treat but a key part of your care is to encourage you to drink plenty of fluids and watch your symptoms carefully.  I have prescribed Keflex 500 mg twice a day for 7 days.  Your symptoms should gradually improve. Call us if the burning in your urine worsens, you develop worsening fever, back pain or pelvic pain or if your symptoms do not resolve after completing the antibiotic.  Urinary tract infections can be prevented by drinking plenty of water to keep your body hydrated.  Also be sure when you wipe, wipe from front to back and don't hold it in!  If possible, empty your bladder every 4 hours.  Your e-visit answers were reviewed by a board certified advanced clinical practitioner to complete your personal care plan.  Depending on the condition, your plan could have included both over the counter or prescription medications.  If there is a problem please reply  once you have received a response from your provider.  Your safety is important to [name].  If you have drug allergies check your prescription carefully.    You can use MyChart to ask questions about today's visit, request a non-urgent call back, or ask for a work or school excuse for 24 hours related to this e-Visit. If it has been greater than 24 hours you will need to follow up with your provider, or enter a new e-Visit to address those concerns.   You will get an e-mail in the next two days asking about your experience.  I hope that your e-visit has been valuable and will speed your recovery. Thank you for using  e-visits.

## 2018-06-22 NOTE — Progress Notes (Signed)
Thank you for the details you included in the comment boxes. Those details are very helpful in determining the best course of treatment for you and help [name] to provide the best care. Given the back pain and belly pain, you will need a workup and exam to rule out kidney involvement.  Based on what you shared with me it looks like you have a serious condition that should be evaluated in a face to face office visit.  NOTE: If you entered your credit card information for this eVisit, you will not be charged. You may see a "hold" on your card for the $30 but that hold will drop off and you will not have a charge processed.  If you are having a true medical emergency please call 911.  If you need an urgent face to face visit, [facility] has four urgent care centers for your convenience.  If you need care fast and have a high deductible or no insurance consider:   [email or web address] to reserve your spot online an avoid wait times  [facility] [address] [place], [place] [address] 8 am to 8 pm Monday-Friday 10 am to 4 pm Saturday-Sunday *Across the street from [employer]  [place], [address] 8 am to 5 pm Monday-Friday * In the [facility] on the [facility]   The following sites will take your  insurance:  . [facility] [place] a Provider at this Location  [address] [place], [place] [address] . 10 am to 8 pm Monday-Friday . 12 pm to 8 pm Saturday-Sunday   . [facility] at [place] a Provider at this Location  [address] [place], [place], [place] [address] . 8 am to 8 pm Monday-Friday . 9 am to 6 pm Saturday . 11 am to 6 pm Sunday   . [facility] at [place] Get Driving Directions  [address] [place],  [place] [address] . 8 am to 8 pm Monday-Friday . 8 am to 4 pm Saturday-Sunday   Your e-visit answers were reviewed by a board certified advanced clinical practitioner to complete your personal care plan.  Thank you for using e-Visits.

## 2018-06-24 ENCOUNTER — Ambulatory Visit (INDEPENDENT_AMBULATORY_CARE_PROVIDER_SITE_OTHER): Payer: 59 | Admitting: Family Medicine

## 2018-06-24 ENCOUNTER — Encounter (INDEPENDENT_AMBULATORY_CARE_PROVIDER_SITE_OTHER): Payer: Self-pay | Admitting: Family Medicine

## 2018-06-24 VITALS — BP 105/71 | HR 64 | Temp 98.3°F | Ht 68.0 in | Wt 271.0 lb

## 2018-06-24 DIAGNOSIS — E559 Vitamin D deficiency, unspecified: Secondary | ICD-10-CM | POA: Diagnosis not present

## 2018-06-24 DIAGNOSIS — I10 Essential (primary) hypertension: Secondary | ICD-10-CM | POA: Diagnosis not present

## 2018-06-24 DIAGNOSIS — E8881 Metabolic syndrome: Secondary | ICD-10-CM | POA: Diagnosis not present

## 2018-06-24 DIAGNOSIS — Z9189 Other specified personal risk factors, not elsewhere classified: Secondary | ICD-10-CM

## 2018-06-24 DIAGNOSIS — Z6841 Body Mass Index (BMI) 40.0 and over, adult: Secondary | ICD-10-CM

## 2018-06-24 DIAGNOSIS — F3289 Other specified depressive episodes: Secondary | ICD-10-CM

## 2018-06-24 MED ORDER — HYDROCHLOROTHIAZIDE 25 MG PO TABS: 25.0000 mg | ORAL_TABLET | Freq: Every day | ORAL | 0 refills | Status: DC

## 2018-06-24 MED ORDER — METFORMIN HCL 500 MG PO TABS: 500.0000 mg | ORAL_TABLET | Freq: Two times a day (BID) | ORAL | 0 refills | Status: DC

## 2018-06-24 MED ORDER — VITAMIN D (ERGOCALCIFEROL) 1.25 MG (50000 UNIT) PO CAPS: 50000.0000 [IU] | ORAL_CAPSULE | ORAL | 0 refills | Status: DC

## 2018-06-24 MED ORDER — BUPROPION HCL ER (SR) 150 MG PO TB12: 150.0000 mg | ORAL_TABLET | Freq: Every day | ORAL | 0 refills | Status: DC

## 2018-06-28 NOTE — Progress Notes (Signed)
Office: [phone]  /  Fax: [phone]   HPI:   Chief Complaint: OBESITY Wanda Peters is here to discuss her progress with her obesity treatment plan. She is keeping a food journal with 1500 calories and 100+ grams of protein and is also following the Category 3 plan. She is following her eating plan approximately 20 % of the time. She states she is exercising 0 minutes 0 times per week. Wanda Peters did well minimizing weight gain over the holidays. She is struggling to get back on track and thinks she will do better with a more structured plan.  Her weight is 271 lb (122.9 kg) today and has had a weight gain of 1 pounds over a period of 4 weeks since her last visit. She has lost 10 lbs since starting treatment with [name].  Vitamin D deficiency Wanda Peters has a diagnosis of vitamin D deficiency. She is currently stable on vit D. She notes fatigue and denies nausea, vomiting, or muscle weakness.  Insulin Resistance Wanda Peters has a diagnosis of insulin resistance based on her elevated fasting insulin level >5. Although Wanda Peters's blood glucose readings are still under good control, insulin resistance puts her at greater risk of metabolic syndrome and diabetes. She is doing well on metformin, but sometimes has GI upset with her 2nd dose especially when she indulges over the holidays.  At risk for diabetes Wanda Peters is at higher than average risk for developing diabetes due to her insulin resistance and obesity. She currently denies polyuria or polydipsia.  Hypertension Wanda Peters is a 43 y.o. female with hypertension. Wanda Peters's blood pressure is currently well controlled on HCTZ and diet. She is working on weight loss to help control her blood pressure with the goal of decreasing her risk of heart attack and stroke. Wanda Peters denies chest pain, headache, or lightheadedness.   Depression with emotional eating behaviors Wanda Peters's mood is stable on Wellbutrin. She notes some insomnia  recently, but does not feel this is related to Wellbutrin. Her blood pressure is stable. She is struggling with emotional eating and using food for comfort to the extent that it is negatively impacting her health. She often snacks when she is not hungry. Wanda Peters sometimes feels she is out of control and then feels guilty that she made poor food choices. She has been working on behavior modification techniques to help reduce her emotional eating and has been somewhat successful.   ASSESSMENT AND PLAN:  Vitamin D deficiency - Plan: Vitamin D, Ergocalciferol, (DRISDOL) 1.25 MG (50000 UT) CAPS capsule  Insulin resistance - Plan: metFORMIN (GLUCOPHAGE) 500 MG tablet  Essential hypertension - Plan: hydrochlorothiazide (HYDRODIURIL) 25 MG tablet  Other depression - with emotional eating - Plan: buPROPion (WELLBUTRIN SR) 150 MG 12 hr tablet  At risk for diabetes mellitus  Class 3 severe obesity with serious comorbidity and body mass index (BMI) of 40.0 to 44.9 in adult, unspecified obesity type ([place])  PLAN:  Vitamin D Deficiency Wanda Peters was informed that low vitamin D levels contributes to fatigue and are associated with obesity, breast, and colon cancer. She agrees to continue to take prescription Vit D @50 ,000 IU every week #4 with no refills and will follow up for routine testing of vitamin D, at least 2-3 times per year. She was informed of the risk of over-replacement of vitamin D and agrees to not increase her dose unless she discusses this with [name] first. [place] agrees to follow up in 3 weeks.  Insulin Resistance Wanda Peters will continue to work on [employer]  loss, exercise, and decreasing simple carbohydrates in her diet to help decrease the risk of diabetes. She was informed that eating too many simple carbohydrates or too many calories at one sitting increases the likelihood of GI side effects. Dazaria agreed to continue metformin 500mg  BID #60 with no refills and a prescription was  written today. Letasha agreed to follow up with [name] as directed to monitor her progress.  Diabetes risk counseling Wanda Peters was given extended (15 minutes) diabetes prevention counseling today. She is 43 y.o. female and has risk factors for diabetes including insulin resistance and obesity. We discussed intensive lifestyle modifications today with an emphasis on weight loss as well as increasing exercise and decreasing simple carbohydrates in her diet.  Hypertension We discussed sodium restriction, working on healthy weight loss, and a regular exercise program as the means to achieve improved blood pressure control. We will continue to monitor her blood pressure as well as her progress with the above lifestyle modifications. She will continue her diet, weight loss, and HCTZ 25mg  qd #30 with no refills and will watch for signs of hypotension as she continues her lifestyle modifications. Wanda Peters agreed with this plan and agreed to follow up as directed in 3 weeks.  Depression with Emotional Eating Behaviors We discussed behavior modification techniques today to help Wanda Peters deal with her emotional eating and depression. She has agreed to take Wellbutrin SR 150mg  qd #30 with no refills and agreed to follow up as directed.  Obesity Wanda Peters is currently in the action stage of change. As such, her goal is to continue with weight loss efforts. She has agreed to change to follow the Category 3 plan. Wanda Peters has been instructed to work up to a goal of 150 minutes of combined cardio and strengthening exercise per week for weight loss and overall health benefits. We discussed the following Behavioral Modification Strategies today: increasing lean protein intake, work on meal planning and easy cooking plans, planning for success, and emotional eating strategies.  Wanda Peters has agreed to follow up with our clinic in 3 weeks. She was informed of the importance of frequent follow up visits to maximize  her success with intensive lifestyle modifications for her multiple health conditions.  ALLERGIES: Allergies  Allergen Reactions  . Sulfonamide Derivatives     REACTION: rash    MEDICATIONS: Current Outpatient Medications on File Prior to Visit  Medication Sig Dispense Refill  . cephALEXin (KEFLEX) 500 MG capsule Take 1 capsule (500 mg total) by mouth 2 (two) times daily. 14 capsule 0  . Cholecalciferol (VITAMIN D3) 50000 units CAPS Take 1 capsule by mouth every 7 (seven) days. 12 capsule 0  . fexofenadine (ALLEGRA) 180 MG tablet Take 180 mg by mouth daily.    . fluticasone (FLONASE) 50 MCG/ACT nasal spray Place 1 spray into both nostrils daily.    . polyethylene glycol powder (GLYCOLAX/MIRALAX) powder Take 17 g by mouth daily. 3350 g 0   No current facility-administered medications on file prior to visit.     PAST MEDICAL HISTORY: Past Medical History:  Diagnosis Date  . Dry skin   . Fatigue   . Rupture of ovarian cyst   . Seasonal allergies   . Swelling of extremity   . Trouble in sleeping   . Vitamin D deficiency     PAST SURGICAL HISTORY: History reviewed. No pertinent surgical history.  SOCIAL HISTORY: Social History   Tobacco Use  . Smoking status: Never Smoker  . Smokeless tobacco: Never Used  Substance Use  Topics  . Alcohol use: Yes    Alcohol/week: 0.0 standard drinks    Comment: socially  . Drug use: No    FAMILY HISTORY: Family History  Problem Relation Age of Onset  . Cancer Mother   . Obesity Father    ROS: Review of Systems  Constitutional: Positive for malaise/fatigue. Negative for weight loss.  Cardiovascular: Negative for chest pain.  Gastrointestinal: Negative for nausea and vomiting.  Genitourinary:       Negative for polyuria.  Musculoskeletal:       Negative for muscle weakness.  Neurological: Negative for headaches.       Negative for lightheadedness.  Endo/Heme/Allergies: Negative for polydipsia.  Psychiatric/Behavioral:  Positive for depression. The patient has insomnia.    PHYSICAL EXAM: Blood pressure 105/71, pulse 64, temperature 98.3 F (36.8 C), temperature source Oral, height 5\' 8"  (1.727 m), weight 271 lb (122.9 kg), last menstrual period 06/10/2018, SpO2 97 %. Body mass index is 41.21 kg/m. Physical Exam Vitals signs reviewed.  Constitutional:      Appearance: Normal appearance. She is obese.  Cardiovascular:     Rate and Rhythm: Normal rate.  Pulmonary:     Effort: Pulmonary effort is normal.  Musculoskeletal: Normal range of motion.  Skin:    General: Skin is warm and dry.  Neurological:     Mental Status: She is alert and oriented to person, place, and time.  Psychiatric:        Mood and Affect: Mood normal.        Behavior: Behavior normal.    RECENT LABS AND TESTS: BMET    Component Value Date/Time   NA 138 04/19/2018 0834   K 4.3 04/19/2018 0834   CL 102 04/19/2018 0834   CO2 22 04/19/2018 0834   GLUCOSE 88 04/19/2018 0834   GLUCOSE 95 06/25/2017 0854   BUN 18 04/19/2018 0834   CREATININE 0.83 04/19/2018 0834   CREATININE 0.63 12/01/2014 1532   CALCIUM 9.2 04/19/2018 0834   GFRNONAA 86 04/19/2018 0834   GFRAA 99 04/19/2018 0834   Lab Results  Component Value Date   HGBA1C 5.4 04/19/2018   HGBA1C 5.3 12/21/2017   HGBA1C 5.5 08/26/2017   HGBA1C 5.3 05/12/2008   Lab Results  Component Value Date   INSULIN 7.9 04/19/2018   INSULIN 8.6 12/21/2017   INSULIN 10.3 08/26/2017   CBC    Component Value Date/Time   WBC 7.5 04/19/2018 0834   WBC 5.9 07/14/2017 0905   RBC 4.38 04/19/2018 0834   RBC 4.19 07/14/2017 0905   HGB 13.8 04/19/2018 0834   HCT 41.4 04/19/2018 0834   PLT 262 04/19/2018 0834   MCV 95 04/19/2018 0834   MCH 31.5 04/19/2018 0834   MCH 31.2 12/01/2014 1532   MCHC 33.3 04/19/2018 0834   MCHC 33.8 07/14/2017 0905   RDW 12.2 (L) 04/19/2018 0834   LYMPHSABS 1.9 04/19/2018 0834   MONOABS 0.4 07/14/2017 0905   EOSABS 0.6 (H) 04/19/2018 0834    BASOSABS 0.1 04/19/2018 0834   Iron/TIBC/Ferritin/ %Sat No results found for: IRON, TIBC, FERRITIN, IRONPCTSAT Lipid Panel     Component Value Date/Time   CHOL 192 04/19/2018 0834   TRIG 96 04/19/2018 0834   HDL 52 04/19/2018 0834   CHOLHDL 3.7 04/19/2018 0834   CHOLHDL 3 06/25/2017 0854   VLDL 10.8 06/25/2017 0854   LDLCALC 121 (H) 04/19/2018 0834   Hepatic Function Panel     Component Value Date/Time   PROT 6.9 04/19/2018 0834  ALBUMIN 4.2 04/19/2018 0834   AST 15 04/19/2018 0834   ALT 19 04/19/2018 0834   ALKPHOS 99 04/19/2018 0834   BILITOT 0.3 04/19/2018 0834   BILIDIR 0.1 05/12/2008 0942      Component Value Date/Time   TSH 4.03 07/14/2017 0905   TSH 1.688 12/01/2014 1532   TSH 1.93 05/12/2008 0942   Results for THERASA, LORENZI (MRN [id number]) as of 06/28/2018 05:22  Ref. Range 04/19/2018 08:34  Vitamin D, 25-Hydroxy Latest Ref Range: 30.0 - 100.0 ng/mL 47.1    OBESITY BEHAVIORAL INTERVENTION VISIT  Today's visit was # 16   Starting weight: 281 lbs Starting date: 08/26/17 Today's weight : Weight: 271 lb (122.9 kg)  Today's date: 06/24/2018 Total lbs lost to date: 10  ASK: We discussed the diagnosis of obesity with [name] today and Riverlyn agreed to give [name] permission to discuss obesity behavioral modification therapy today.  ASSESS: Janneth has the diagnosis of obesity and her BMI today is 41.2. Danette is in the action stage of change.   ADVISE: Metzli was educated on the multiple health risks of obesity as well as the benefit of weight loss to improve her health. She was advised of the need for long term treatment and the importance of lifestyle modifications to improve her current health and to decrease her risk of future health problems.  AGREE: Multiple dietary modification options and treatment options were discussed and Crucita agreed to follow the recommendations documented in the above  note.  ARRANGE: Meilin was educated on the importance of frequent visits to treat obesity as outlined per CMS and USPSTF guidelines and agreed to schedule her next follow up appointment today.  I, Marcille Blanco, am acting as transcriptionist for Starlyn Skeans, MD  I have reviewed the above documentation for accuracy and completeness, and I agree with the above. -Dennard Nip, MD

## 2018-07-19 ENCOUNTER — Encounter (INDEPENDENT_AMBULATORY_CARE_PROVIDER_SITE_OTHER): Payer: Self-pay | Admitting: Family Medicine

## 2018-07-19 ENCOUNTER — Ambulatory Visit (INDEPENDENT_AMBULATORY_CARE_PROVIDER_SITE_OTHER): Payer: 59 | Admitting: Family Medicine

## 2018-07-19 VITALS — BP 106/73 | HR 63 | Temp 98.3°F | Ht 68.0 in | Wt 264.0 lb

## 2018-07-19 DIAGNOSIS — Z9189 Other specified personal risk factors, not elsewhere classified: Secondary | ICD-10-CM | POA: Diagnosis not present

## 2018-07-19 DIAGNOSIS — F3289 Other specified depressive episodes: Secondary | ICD-10-CM | POA: Diagnosis not present

## 2018-07-19 DIAGNOSIS — I10 Essential (primary) hypertension: Secondary | ICD-10-CM

## 2018-07-19 DIAGNOSIS — Z6841 Body Mass Index (BMI) 40.0 and over, adult: Secondary | ICD-10-CM

## 2018-07-19 MED ORDER — HYDROCHLOROTHIAZIDE 25 MG PO TABS: 25.0000 mg | ORAL_TABLET | Freq: Every day | ORAL | 0 refills | Status: DC

## 2018-07-19 MED ORDER — BUPROPION HCL ER (SR) 150 MG PO TB12: 150.0000 mg | ORAL_TABLET | Freq: Every day | ORAL | 0 refills | Status: DC

## 2018-07-19 MED FILL — metFORMIN HCL 500 MG TABS: 500 | 30 days supply | Qty: 60 | Fill #0

## 2018-07-19 MED FILL — BUPROPION HCL SR 150 MG TAB: 150 | 30 days supply | Qty: 30 | Fill #0

## 2018-07-19 MED FILL — VIT D2 1.25 MG (50,000 UNIT: 1.25 MG | 28 days supply | Qty: 4 | Fill #0

## 2018-07-19 MED FILL — HYDROCHLOROTHIAZIDE 25 MG T: 25 | 30 days supply | Qty: 30 | Fill #0

## 2018-07-19 NOTE — Progress Notes (Signed)
Office: [phone]  /  Fax: [phone]   HPI:   Chief Complaint: OBESITY Wanda Peters is here to discuss her progress with her obesity treatment plan. She is keeping a food journal with 1500 calories and 100 grams of protein and is following her eating plan approximately 90 % of the time. She states she is exercising 0 minutes 0 times per week. Wanda Peters continues to do well with weight loss. She is doing well with journaling and likes the freedom.  Her weight is 264 lb (119.7 kg) today and has had a weight loss of 7 pounds over a period of 3 weeks since her last visit. She has lost 17 lbs since starting treatment with [name].  Hypertension Wanda Peters is a 43 y.o. female with hypertension. Lenzie's blood pressure is currently stable on medications. She is working on weight loss to help control her blood pressure with the goal of decreasing her risk of heart attack and stroke. Wanda Peters denies chest pain or headache.  At risk for cardiovascular disease Wanda Peters is at a higher than average risk for cardiovascular disease due to hypertension and obesity. She currently denies any chest pain.  Depression with emotional eating behaviors Wanda Peters's mood is stable and she is doing well with minimizing emotional eating and using food for comfort to the extent that it is negatively impacting her health. She often snacks when she is not hungry. Wanda Peters sometimes feels she is out of control and then feels guilty that she made poor food choices. She has been working on behavior modification techniques to help reduce her emotional eating and has been somewhat successful. Her blood pressure is controlled and she denies insomnia.  ASSESSMENT AND PLAN:  Essential hypertension - Plan: hydrochlorothiazide (HYDRODIURIL) 25 MG tablet  Other depression - with emotional eating - Plan: buPROPion (WELLBUTRIN SR) 150 MG 12 hr tablet  At risk for heart disease  Class 3 severe obesity with serious  comorbidity and body mass index (BMI) of 40.0 to 44.9 in adult, unspecified obesity type (Wanda Peters)  PLAN:  Hypertension We discussed sodium restriction, working on healthy weight loss, and a regular exercise program as the means to achieve improved blood pressure control. We will continue to monitor her blood pressure as well as her progress with the above lifestyle modifications. She will continue her HCTZ 25mg  qd #30 with no refills and will watch for signs of hypotension as she continues her lifestyle modifications. Sonny agreed with this plan and agreed to follow up as directed in 3 weeks.  Cardiovascular risk counseling Wanda Peters was given extended (15 minutes) coronary artery disease prevention counseling today. She is 43 y.o. female and has risk factors for heart disease including hypertension and obesity. We discussed intensive lifestyle modifications today with an emphasis on specific weight loss instructions and strategies. Pt was also informed of the importance of increasing exercise and decreasing saturated fats to help prevent heart disease.  Depression with Emotional Eating Behaviors We discussed behavior modification techniques today to help Wanda Peters deal with her emotional eating and depression. She has agreed to take Wellbutrin SR 150mg  qd and agreed to follow up as directed.  Obesity Wanda Peters is currently in the action stage of change. As such, her goal is to continue with weight loss efforts. She has agreed to keep a food journal with 1500 calories and 85+ grams of protein.  Wanda Peters has been instructed to work up to a goal of 150 minutes of combined cardio and strengthening exercise per week for weight loss  and overall health benefits. We discussed the following Behavioral Modification Strategies today: increasing lean protein intake, decreasing simple carbohydrates, no skipping meals, and work on meal planning and easy cooking plans. We discussed ways to improve meal  prepping and recipe options today.  Wanda Peters has agreed to follow up with our clinic in 3 weeks for a fasting appointment. She was informed of the importance of frequent follow up visits to maximize her success with intensive lifestyle modifications for her multiple health conditions.  ALLERGIES: Allergies  Allergen Reactions  . Sulfonamide Derivatives     REACTION: rash    MEDICATIONS: Current Outpatient Medications on File Prior to Visit  Medication Sig Dispense Refill  . Cholecalciferol (VITAMIN D3) 50000 units CAPS Take 1 capsule by mouth every 7 (seven) days. 12 capsule 0  . fexofenadine (ALLEGRA) 180 MG tablet Take 180 mg by mouth daily.    . fluticasone (FLONASE) 50 MCG/ACT nasal spray Place 1 spray into both nostrils daily.    . metFORMIN (GLUCOPHAGE) 500 MG tablet Take 1 tablet (500 mg total) by mouth 2 (two) times daily with a meal. 60 tablet 0  . polyethylene glycol powder (GLYCOLAX/MIRALAX) powder Take 17 g by mouth daily. 3350 g 0  . Vitamin D, Ergocalciferol, (DRISDOL) 1.25 MG (50000 UT) CAPS capsule Take 1 capsule (50,000 Units total) by mouth every 7 (seven) days. 4 capsule 0   No current facility-administered medications on file prior to visit.     PAST MEDICAL HISTORY: Past Medical History:  Diagnosis Date  . Dry skin   . Fatigue   . Rupture of ovarian cyst   . Seasonal allergies   . Swelling of extremity   . Trouble in sleeping   . Vitamin D deficiency     PAST SURGICAL HISTORY: History reviewed. No pertinent surgical history.  SOCIAL HISTORY: Social History   Tobacco Use  . Smoking status: Never Smoker  . Smokeless tobacco: Never Used  Substance Use Topics  . Alcohol use: Yes    Alcohol/week: 0.0 standard drinks    Comment: socially  . Drug use: No    FAMILY HISTORY: Family History  Problem Relation Age of Onset  . Cancer Mother   . Obesity Father     ROS: Review of Systems  Constitutional: Positive for weight loss.  Cardiovascular:  Negative for chest pain.  Neurological: Negative for headaches.  Psychiatric/Behavioral: Positive for depression. The patient does not have insomnia.     PHYSICAL EXAM: Blood pressure 106/73, pulse 63, temperature 98.3 F (36.8 C), temperature source Oral, height 5\' 8"  (1.727 m), weight 264 lb (119.7 kg), last menstrual period 07/05/2018, SpO2 98 %. Body mass index is 40.14 kg/m. Physical Exam Vitals signs reviewed.  Constitutional:      Appearance: Normal appearance. She is obese.  Cardiovascular:     Rate and Rhythm: Normal rate.  Pulmonary:     Effort: Pulmonary effort is normal.  Musculoskeletal: Normal range of motion.  Skin:    General: Skin is warm and dry.  Neurological:     Mental Status: She is alert and oriented to person, place, and time.  Psychiatric:        Mood and Affect: Mood normal.        Behavior: Behavior normal.     RECENT LABS AND TESTS: BMET    Component Value Date/Time   NA 138 04/19/2018 0834   K 4.3 04/19/2018 0834   CL 102 04/19/2018 0834   CO2 22 04/19/2018 0834  GLUCOSE 88 04/19/2018 0834   GLUCOSE 95 06/25/2017 0854   BUN 18 04/19/2018 0834   CREATININE 0.83 04/19/2018 0834   CREATININE 0.63 12/01/2014 1532   CALCIUM 9.2 04/19/2018 0834   GFRNONAA 86 04/19/2018 0834   GFRAA 99 04/19/2018 0834   Lab Results  Component Value Date   HGBA1C 5.4 04/19/2018   HGBA1C 5.3 12/21/2017   HGBA1C 5.5 08/26/2017   HGBA1C 5.3 05/12/2008   Lab Results  Component Value Date   INSULIN 7.9 04/19/2018   INSULIN 8.6 12/21/2017   INSULIN 10.3 08/26/2017   CBC    Component Value Date/Time   WBC 7.5 04/19/2018 0834   WBC 5.9 07/14/2017 0905   RBC 4.38 04/19/2018 0834   RBC 4.19 07/14/2017 0905   HGB 13.8 04/19/2018 0834   HCT 41.4 04/19/2018 0834   PLT 262 04/19/2018 0834   MCV 95 04/19/2018 0834   MCH 31.5 04/19/2018 0834   MCH 31.2 12/01/2014 1532   MCHC 33.3 04/19/2018 0834   MCHC 33.8 07/14/2017 0905   RDW 12.2 (L) 04/19/2018 0834     LYMPHSABS 1.9 04/19/2018 0834   MONOABS 0.4 07/14/2017 0905   EOSABS 0.6 (H) 04/19/2018 0834   BASOSABS 0.1 04/19/2018 0834   Iron/TIBC/Ferritin/ %Sat No results found for: IRON, TIBC, FERRITIN, IRONPCTSAT Lipid Panel     Component Value Date/Time   CHOL 192 04/19/2018 0834   TRIG 96 04/19/2018 0834   HDL 52 04/19/2018 0834   CHOLHDL 3.7 04/19/2018 0834   CHOLHDL 3 06/25/2017 0854   VLDL 10.8 06/25/2017 0854   LDLCALC 121 (H) 04/19/2018 0834   Hepatic Function Panel     Component Value Date/Time   PROT 6.9 04/19/2018 0834   ALBUMIN 4.2 04/19/2018 0834   AST 15 04/19/2018 0834   ALT 19 04/19/2018 0834   ALKPHOS 99 04/19/2018 0834   BILITOT 0.3 04/19/2018 0834   BILIDIR 0.1 05/12/2008 0942      Component Value Date/Time   TSH 4.03 07/14/2017 0905   TSH 1.688 12/01/2014 1532   TSH 1.93 05/12/2008 0942   Results for HANSINI, CLODFELTER (MRN [id number]) as of 07/19/2018 15:35  Ref. Range 04/19/2018 08:34  Vitamin D, 25-Hydroxy Latest Ref Range: 30.0 - 100.0 ng/mL 47.1    OBESITY BEHAVIORAL INTERVENTION VISIT  Today's visit was # 17   Starting weight: 281 lbs Starting date: 08/26/17 Today's weight : Weight: 264 lb (119.7 kg)  Today's date: 07/19/2018 Total lbs lost to date: 17  ASK: We discussed the diagnosis of obesity with Wanda Peters today and Wanda Peters agreed to give [name] permission to discuss obesity behavioral modification therapy today.  ASSESS: Wanda Peters has the diagnosis of obesity and her BMI today is 40.15. Wanda Peters is in the action stage of change.   ADVISE: Wanda Peters was educated on the multiple health risks of obesity as well as the benefit of weight loss to improve her health. She was advised of the need for long term treatment and the importance of lifestyle modifications to improve her current health and to decrease her risk of future health problems.  AGREE: Multiple dietary modification options and treatment options were discussed  and Wanda Peters agreed to follow the recommendations documented in the above note.  ARRANGE: Wanda Peters was educated on the importance of frequent visits to treat obesity as outlined per CMS and USPSTF guidelines and agreed to schedule her next follow up appointment today.  IMarcille Blanco, CMA, am acting as transcriptionist for Starlyn Skeans, MD  I have reviewed the above documentation for accuracy and completeness, and I agree with the above. -Dennard Nip, MD

## 2018-08-12 ENCOUNTER — Encounter (INDEPENDENT_AMBULATORY_CARE_PROVIDER_SITE_OTHER): Payer: Self-pay | Admitting: Family Medicine

## 2018-08-12 ENCOUNTER — Ambulatory Visit (INDEPENDENT_AMBULATORY_CARE_PROVIDER_SITE_OTHER): Payer: 59 | Admitting: Family Medicine

## 2018-08-12 VITALS — BP 101/69 | HR 64 | Temp 97.9°F | Ht 68.0 in | Wt 257.0 lb

## 2018-08-12 DIAGNOSIS — Z9189 Other specified personal risk factors, not elsewhere classified: Secondary | ICD-10-CM

## 2018-08-12 DIAGNOSIS — F32A Depression, unspecified: Secondary | ICD-10-CM | POA: Insufficient documentation

## 2018-08-12 DIAGNOSIS — E66813 Obesity, class 3: Secondary | ICD-10-CM | POA: Insufficient documentation

## 2018-08-12 DIAGNOSIS — F419 Anxiety disorder, unspecified: Secondary | ICD-10-CM | POA: Insufficient documentation

## 2018-08-12 DIAGNOSIS — Z6839 Body mass index (BMI) 39.0-39.9, adult: Secondary | ICD-10-CM

## 2018-08-12 DIAGNOSIS — Z6841 Body Mass Index (BMI) 40.0 and over, adult: Secondary | ICD-10-CM | POA: Insufficient documentation

## 2018-08-12 DIAGNOSIS — I10 Essential (primary) hypertension: Secondary | ICD-10-CM | POA: Diagnosis not present

## 2018-08-12 DIAGNOSIS — F3289 Other specified depressive episodes: Secondary | ICD-10-CM | POA: Diagnosis not present

## 2018-08-12 DIAGNOSIS — F329 Major depressive disorder, single episode, unspecified: Secondary | ICD-10-CM | POA: Insufficient documentation

## 2018-08-12 MED ORDER — BUPROPION HCL ER (SR) 150 MG PO TB12: 150.0000 mg | ORAL_TABLET | Freq: Every day | ORAL | 0 refills | Status: DC

## 2018-08-12 MED ORDER — HYDROCHLOROTHIAZIDE 25 MG PO TABS: 25.0000 mg | ORAL_TABLET | Freq: Every day | ORAL | 0 refills | Status: DC

## 2018-08-12 NOTE — Progress Notes (Signed)
Office: [phone]  /  Fax: [phone]   HPI:   Chief Complaint: OBESITY Wanda Peters is here to discuss her progress with her obesity treatment plan. She is keeping a food journal with 1500 calories and 85+ grams of protein daily and is following her eating plan approximately 75% of the time. She states she is using resistance bands 30 minutes 3 times per week. Wanda Peters states she is meeting her protein goals. She states she sticks to the plan tightly during the week and gives herself some leeway on the weekends. She is not journaling on the weekends. She denies polyphagia. Her weight is 257 lb (116.6 kg) today and has had a weight loss of 7 pounds over a period of 3 weeks since her last visit. She has lost 24 lbs since starting treatment with [name].  Hypertension Wanda Peters is a 43 y.o. female with hypertension well controlled on HCTZ.  Wanda Peters denies chest pain or shortness of breath on exertion. She is working weight loss to help control her blood pressure with the goal of decreasing her risk of heart attack and stroke.  At risk for cardiovascular disease Wanda Peters is at a higher than average risk for cardiovascular disease due to obesity. She currently denies any chest pain.  Depression with emotional eating behaviors Wanda Peters states she feels that emotional eating is well controlled at this time. . She shows no sign of suicidal or homicidal ideations.  Depression screen [facility] 2/9 08/26/2017 06/30/2017  Decreased Interest 1 0  Down, Depressed, Hopeless 1 0  PHQ - 2 Score 2 0  Altered sleeping 2 -  Tired, decreased energy 3 -  Change in appetite 2 -  Feeling bad or failure about yourself  1 -  Trouble concentrating 0 -  Moving slowly or fidgety/restless 0 -  Suicidal thoughts 0 -  PHQ-9 Score 10 -  Difficult doing work/chores Not difficult at all -   ASSESSMENT AND PLAN:  Essential hypertension - Plan: hydrochlorothiazide (HYDRODIURIL) 25 MG  tablet  Other depression - with emotional eating - Plan: buPROPion (WELLBUTRIN SR) 150 MG 12 hr tablet  At risk for heart disease  Class 2 severe obesity with serious comorbidity and body mass index (BMI) of 39.0 to 39.9 in adult, unspecified obesity type (HCC)  PLAN:  Hypertension We discussed sodium restriction, working on healthy weight loss, and a regular exercise program as the means to achieve improved blood pressure control. Wanda Peters agreed with this plan and agreed to follow up as directed. We will continue to monitor her blood pressure as well as her progress with the above lifestyle modifications. She was given a refill on her HCTZ 25 mg qd #30 with 0 refills and will watch for signs of hypotension as she continues her lifestyle modifications. Wanda Peters agrees to follow-up with our clinic in 3 weeks.  Cardiovascular risk counseling Wanda Peters was given extended (15 minutes) coronary artery disease prevention counseling today. She is 43 y.o. female and has risk factors for heart disease including obesity. We discussed intensive lifestyle modifications today with an emphasis on specific weight loss instructions and strategies. Pt was also informed of the importance of increasing exercise and decreasing saturated fats to help prevent heart disease.  Depression with Emotional Eating Behaviors We discussed behavior modification techniques today to help Wanda Peters deal with her emotional eating and depression. She was given a refill on her bupropion 150 mg qam #30 with 0 refills. Wanda Peters agrees to follow-up with our clinic in 3 weeks.  Obesity Wanda Peters is currently in the action stage of change. As such, her goal is to continue with weight loss efforts. She has agreed to keep a food journal with 1500 calories and 85 grams of protein. She was asked to consider journaling on the weekends.  Wanda Peters has been instructed to continue her exercise regimen as above. We discussed the following  Behavioral Modification Strategies today: planning for success and keep a strict food journal.  Wanda Peters has agreed to follow-up with our clinic in 3 weeks. She was informed of the importance of frequent follow up visits to maximize her success with intensive lifestyle modifications for her multiple health conditions.  ALLERGIES: Allergies  Allergen Reactions  . Sulfonamide Derivatives     REACTION: rash    MEDICATIONS: Current Outpatient Medications on File Prior to Visit  Medication Sig Dispense Refill  . Cholecalciferol (VITAMIN D3) 50000 units CAPS Take 1 capsule by mouth every 7 (seven) days. 12 capsule 0  . fexofenadine (ALLEGRA) 180 MG tablet Take 180 mg by mouth daily.    . fluticasone (FLONASE) 50 MCG/ACT nasal spray Place 1 spray into both nostrils daily.    . metFORMIN (GLUCOPHAGE) 500 MG tablet Take 1 tablet (500 mg total) by mouth 2 (two) times daily with a meal. 60 tablet 0  . polyethylene glycol powder (GLYCOLAX/MIRALAX) powder Take 17 g by mouth daily. 3350 g 0  . Vitamin D, Ergocalciferol, (DRISDOL) 1.25 MG (50000 UT) CAPS capsule Take 1 capsule (50,000 Units total) by mouth every 7 (seven) days. 4 capsule 0   No current facility-administered medications on file prior to visit.     PAST MEDICAL HISTORY: Past Medical History:  Diagnosis Date  . Dry skin   . Fatigue   . Rupture of ovarian cyst   . Seasonal allergies   . Swelling of extremity   . Trouble in sleeping   . Vitamin D deficiency     PAST SURGICAL HISTORY: No past surgical history on file.  SOCIAL HISTORY: Social History   Tobacco Use  . Smoking status: Never Smoker  . Smokeless tobacco: Never Used  Substance Use Topics  . Alcohol use: Yes    Alcohol/week: 0.0 standard drinks    Comment: socially  . Drug use: No    FAMILY HISTORY: Family History  Problem Relation Age of Onset  . Cancer Mother   . Obesity Father    ROS: Review of Systems  Constitutional: Positive for weight loss.    Respiratory: Negative for shortness of breath.   Cardiovascular: Negative for chest pain.  Endo/Heme/Allergies:       Negative for polyphagia. Negative for hypoglycemia.  Psychiatric/Behavioral: Positive for depression (emotional eating).   PHYSICAL EXAM: Blood pressure 101/69, pulse 64, temperature 97.9 F (36.6 C), temperature source Oral, height 5\' 8"  (1.727 m), weight 257 lb (116.6 kg), last menstrual period 07/22/2018, SpO2 98 %. Body mass index is 39.08 kg/m. Physical Exam Vitals signs reviewed.  Constitutional:      Appearance: Normal appearance. She is obese.  Cardiovascular:     Rate and Rhythm: Normal rate.     Pulses: Normal pulses.  Pulmonary:     Effort: Pulmonary effort is normal.     Breath sounds: Normal breath sounds.  Musculoskeletal: Normal range of motion.  Skin:    General: Skin is warm and dry.  Neurological:     Mental Status: She is alert and oriented to person, place, and time.  Psychiatric:        Behavior:  Behavior normal.   RECENT LABS AND TESTS: BMET    Component Value Date/Time   NA 138 04/19/2018 0834   K 4.3 04/19/2018 0834   CL 102 04/19/2018 0834   CO2 22 04/19/2018 0834   GLUCOSE 88 04/19/2018 0834   GLUCOSE 95 06/25/2017 0854   BUN 18 04/19/2018 0834   CREATININE 0.83 04/19/2018 0834   CREATININE 0.63 12/01/2014 1532   CALCIUM 9.2 04/19/2018 0834   GFRNONAA 86 04/19/2018 0834   GFRAA 99 04/19/2018 0834   Lab Results  Component Value Date   HGBA1C 5.4 04/19/2018   HGBA1C 5.3 12/21/2017   HGBA1C 5.5 08/26/2017   HGBA1C 5.3 05/12/2008   Lab Results  Component Value Date   INSULIN 7.9 04/19/2018   INSULIN 8.6 12/21/2017   INSULIN 10.3 08/26/2017   CBC    Component Value Date/Time   WBC 7.5 04/19/2018 0834   WBC 5.9 07/14/2017 0905   RBC 4.38 04/19/2018 0834   RBC 4.19 07/14/2017 0905   HGB 13.8 04/19/2018 0834   HCT 41.4 04/19/2018 0834   PLT 262 04/19/2018 0834   MCV 95 04/19/2018 0834   MCH 31.5 04/19/2018 0834    MCH 31.2 12/01/2014 1532   MCHC 33.3 04/19/2018 0834   MCHC 33.8 07/14/2017 0905   RDW 12.2 (L) 04/19/2018 0834   LYMPHSABS 1.9 04/19/2018 0834   MONOABS 0.4 07/14/2017 0905   EOSABS 0.6 (H) 04/19/2018 0834   BASOSABS 0.1 04/19/2018 0834   Iron/TIBC/Ferritin/ %Sat No results found for: IRON, TIBC, FERRITIN, IRONPCTSAT Lipid Panel     Component Value Date/Time   CHOL 192 04/19/2018 0834   TRIG 96 04/19/2018 0834   HDL 52 04/19/2018 0834   CHOLHDL 3.7 04/19/2018 0834   CHOLHDL 3 06/25/2017 0854   VLDL 10.8 06/25/2017 0854   LDLCALC 121 (H) 04/19/2018 0834   Hepatic Function Panel     Component Value Date/Time   PROT 6.9 04/19/2018 0834   ALBUMIN 4.2 04/19/2018 0834   AST 15 04/19/2018 0834   ALT 19 04/19/2018 0834   ALKPHOS 99 04/19/2018 0834   BILITOT 0.3 04/19/2018 0834   BILIDIR 0.1 05/12/2008 0942      Component Value Date/Time   TSH 4.03 07/14/2017 0905   TSH 1.688 12/01/2014 1532   TSH 1.93 05/12/2008 0942    Ref. Range 04/19/2018 08:34  Vitamin D, 25-Hydroxy Latest Ref Range: 30.0 - 100.0 ng/mL 47.1   OBESITY BEHAVIORAL INTERVENTION VISIT  Today's visit was #18  Starting weight: 281 lbs Starting date: 08/26/2017 Today's weight: 257 lbs  Today's date: 08/12/2018 Total lbs lost to date: 24    08/12/2018  Height 5\' 8"  (1.727 m)  Weight 257 lb (116.6 kg)  BMI (Calculated) 39.09  BLOOD PRESSURE - SYSTOLIC 865  BLOOD PRESSURE - DIASTOLIC 69   Body Fat % 78.4 %  Total Body Water (lbs) 93.8 lbs   ASK: We discussed the diagnosis of obesity with Wanda Peters today and Wanda Peters agreed to give [name] permission to discuss obesity behavioral modification therapy today.  ASSESS: Wanda Peters has the diagnosis of obesity and her BMI today is 39.09. Wanda Peters is in the action stage of change.   ADVISE: Wanda Peters was educated on the multiple health risks of obesity as well as the benefit of weight loss to improve her health. She was advised of the need for  long term treatment and the importance of lifestyle modifications to improve her current health and to decrease her risk of future health problems.  AGREE: Multiple dietary modification options and treatment options were discussed and  Wanda Peters agreed to follow the recommendations documented in the above note.  ARRANGE: Wanda Peters was educated on the importance of frequent visits to treat obesity as outlined per CMS and USPSTF guidelines and agreed to schedule her next follow up appointment today.  IMichaelene Song, am acting as Location manager for [employer], FNP-C.  I have reviewed the above documentation for accuracy and completeness, and I agree with the above.  - Marjarie Irion, FNP-C.

## 2018-08-25 ENCOUNTER — Other Ambulatory Visit (INDEPENDENT_AMBULATORY_CARE_PROVIDER_SITE_OTHER): Payer: Self-pay | Admitting: Family Medicine

## 2018-08-25 DIAGNOSIS — E8881 Metabolic syndrome: Secondary | ICD-10-CM

## 2018-08-25 MED FILL — BUPROPION SR 150 MG TABLET: 150 | 30 days supply | Qty: 30 | Fill #0

## 2018-08-25 MED FILL — VIT D2 1.25 MG (50,000 UNIT: 1.25 MG | 28 days supply | Qty: 4 | Fill #0

## 2018-08-25 MED FILL — HYDROCHLOROTHIAZIDE 25 MG T: 25 | 30 days supply | Qty: 30 | Fill #0

## 2018-08-26 MED FILL — metFORMIN HCL 500 MG TABS: 500 | 30 days supply | Qty: 60 | Fill #0

## 2018-08-31 ENCOUNTER — Encounter (INDEPENDENT_AMBULATORY_CARE_PROVIDER_SITE_OTHER): Payer: Self-pay

## 2018-09-02 ENCOUNTER — Ambulatory Visit (INDEPENDENT_AMBULATORY_CARE_PROVIDER_SITE_OTHER): Payer: 59 | Admitting: Family Medicine

## 2018-09-02 ENCOUNTER — Other Ambulatory Visit: Payer: Self-pay

## 2018-09-02 ENCOUNTER — Encounter (INDEPENDENT_AMBULATORY_CARE_PROVIDER_SITE_OTHER): Payer: Self-pay

## 2018-09-02 ENCOUNTER — Encounter (INDEPENDENT_AMBULATORY_CARE_PROVIDER_SITE_OTHER): Payer: Self-pay | Admitting: Family Medicine

## 2018-09-02 DIAGNOSIS — Z6839 Body mass index (BMI) 39.0-39.9, adult: Secondary | ICD-10-CM

## 2018-09-02 DIAGNOSIS — I10 Essential (primary) hypertension: Secondary | ICD-10-CM

## 2018-09-02 DIAGNOSIS — E8881 Metabolic syndrome: Secondary | ICD-10-CM

## 2018-09-02 DIAGNOSIS — E559 Vitamin D deficiency, unspecified: Secondary | ICD-10-CM | POA: Diagnosis not present

## 2018-09-02 DIAGNOSIS — E7849 Other hyperlipidemia: Secondary | ICD-10-CM | POA: Diagnosis not present

## 2018-09-02 DIAGNOSIS — F3289 Other specified depressive episodes: Secondary | ICD-10-CM

## 2018-09-02 MED ORDER — BUPROPION HCL ER (SR) 200 MG PO TB12: 200.0000 mg | ORAL_TABLET | Freq: Every day | ORAL | 0 refills | Status: DC

## 2018-09-02 MED ORDER — HYDROCHLOROTHIAZIDE 25 MG PO TABS: 25.0000 mg | ORAL_TABLET | Freq: Every day | ORAL | 0 refills | Status: DC

## 2018-09-06 ENCOUNTER — Encounter (INDEPENDENT_AMBULATORY_CARE_PROVIDER_SITE_OTHER): Payer: Self-pay | Admitting: Family Medicine

## 2018-09-06 DIAGNOSIS — E88819 Insulin resistance, unspecified: Secondary | ICD-10-CM | POA: Insufficient documentation

## 2018-09-06 DIAGNOSIS — E7849 Other hyperlipidemia: Secondary | ICD-10-CM | POA: Insufficient documentation

## 2018-09-06 DIAGNOSIS — E8881 Metabolic syndrome: Secondary | ICD-10-CM | POA: Insufficient documentation

## 2018-09-06 NOTE — Progress Notes (Addendum)
Office: [phone]  /  Fax: [phone] TeleHealth Visit:  Wanda Peters has consented to this TeleHealth visit today via Face Time. The patient is located at home, the provider is located at the [facility] office. The participants in this visit include the listed provider and patient.   HPI:   Chief Complaint: OBESITY Wanda Peters is here to discuss her progress with her obesity treatment plan. She is keeping a food journal with 1500 calories and 85 grams of protein and is following her eating plan approximately 50 % of the time. She states she is walking 20 minutes 3 times per week. Shatara is not making good choices due to increased stress related to Wanda Peters. She did not weigh today. She is finding all the food that she needs for the plan at the store despite COVID 19.Wanda Peters is meeting protein goals on most days.  We were unable to weigh the patient today for this TeleHealth visit. She feels as if she has maintained weight since her last visit. She has lost 24 lbs since starting treatment with [name].  Hypertension Wanda Peters is a 43 y.o. female with hypertension. She is working on weight loss to help control her blood pressure with the goal of decreasing her risk of heart attack and stroke. Anhar denies chest pain or shortness of breath on exertion. BP Readings from Last 3 Encounters:  08/12/18 101/69  07/19/18 106/73  06/24/18 105/71     Depression with emotional eating behaviors Wanda Peters is struggling with increased stress eating and using food for comfort to the extent that it is negatively impacting her health. She is making poor food choices. Wanda Peters sometimes feels she is out of control and then feels guilty that she made poor food choices. She has been working on behavior modification techniques to help reduce her emotional eating and has been somewhat successful.   Insulin Resistance Wanda Peters has a diagnosis of insulin  resistance based on her elevated fasting insulin level >5. Although Wanda Peters's blood glucose readings are still under good control, insulin resistance puts her at greater risk of metabolic syndrome and diabetes. She is taking metformin currently and continues to work on diet and exercise to decrease risk of diabetes. Wanda Peters denies polyphagia. Lab Results  Component Value Date   HGBA1C 5.4 04/19/2018    Hyperlipidemia Wanda Peters has hyperlipidemia and her last LDL was elevated at 121 on 04/19/18. Her triglycerides and HDL were within normal limits and she is not on a statin. Wanda Peters has been trying to improve her cholesterol levels with intensive lifestyle modification including a low saturated fat diet, exercise, and weight loss.  The 10-year ASCVD risk score Wanda Bussing DC Brooke Bonito., et al., 2013) is: 0.6%   Values used to calculate the score:     Age: 76 years     Sex: Female     Is Non-Hispanic African American: No     Diabetic: No     Tobacco smoker: No     Systolic Blood Pressure: 010 mmHg     Is BP treated: Yes     HDL Cholesterol: 52 mg/dL     Total Cholesterol: 192 mg/dL   Vitamin D Deficiency Wanda Peters has a diagnosis of vitamin D deficiency. She is currently on vit D, and is nearly at goal as her vitamin D level is 47.1 on 04/19/18. Wanda Peters denies nausea, vomiting, or muscle weakness.  ASSESSMENT AND PLAN:  Essential hypertension - Plan: Comprehensive metabolic panel, hydrochlorothiazide (HYDRODIURIL) 25 MG tablet  Insulin resistance - Plan: Hemoglobin A1c, Insulin, random  Other hyperlipidemia - Plan: Lipid panel  Vitamin D deficiency - Plan: VITAMIN D 25 Hydroxy (Vit-D Deficiency, Fractures)  Other depression - Plan: buPROPion (WELLBUTRIN SR) 200 MG 12 hr tablet  Class 2 severe obesity with serious comorbidity and body mass index (BMI) of 39.0 to 39.9 in adult, unspecified obesity type ([place])  Other depression - with emotional eating - Plan: buPROPion (WELLBUTRIN SR) 200  MG 12 hr tablet  PLAN:  Hypertension We discussed sodium restriction, working on healthy weight loss, and a regular exercise program as the means to achieve improved blood pressure control. We will continue to monitor her blood pressure as well as her progress with the above lifestyle modifications. She will continue her HCTZ 25 mg daily #30 with no refills and will watch for signs of hypotension as she continues her lifestyle modifications. Wanda Peters agreed with this plan and agreed to follow up as directed in 2 to 3 weeks.  Insulin Resistance Wanda Peters will continue to work on weight loss, exercise, and decreasing simple carbohydrates in her diet to help decrease the risk of diabetes.  San will have an A1c, a fasting Insulin, and a fasting glucose ordered and  Wanda Peters agreed to follow up with [name] as directed to monitor her progress.   Hyperlipidemia Wanda Peters was informed of the American Heart Association Guidelines emphasizing intensive lifestyle modifications as the first line treatment for hyperlipidemia. We discussed many lifestyle modifications today in depth, and Daniele will continue to work on decreasing saturated fats such as fatty red meat, butter and many fried foods. She will also increase vegetables and lean protein in her diet and continue to work on exercise and weight loss efforts. A FLP will be ordered today and Wanda Peters agrees to follow up at the agreed upon time. Statin is not indicated based on ASCVD risk score.   Vitamin D Deficiency Wanda Peters was informed that low vitamin D levels contribute to fatigue and are associated with obesity, breast, and colon cancer. Wanda Peters agrees to continue to take prescription Vit D @50 ,000 IU every week and will follow up for routine testing of vitamin D, at least 2-3 times per year. She was informed of the risk of over-replacement of vitamin D and agrees to not increase her dose unless she discusses this with [name] first. We ordered a  vitamin D level and Wanda Peters agrees to follow up in 2 to 3 weeks as directed.  Depression with Emotional Eating Behaviors We discussed behavior modification techniques today to help Wanda Peters deal with her emotional eating and depression. She has agreed to increase her dose of bupropion SR to 200 mg qAM #30 with no refills and agreed to follow up as directed.  Obesity Wanda Peters is currently in the action stage of change. As such, her goal is to continue with weight loss efforts. She has agreed to keep a food journal with 1500 calories and 85 grams of protein.  Ingra has been instructed to continue walking 20 minutes 3 times per week. We discussed the following Behavioral Modification Strategies today: decreasing simple carbohydrates, keep a strict food journal, emotional eating, and decrease eating out.  Quantisha has agreed to follow up with our clinic in 2 to 3 weeks. She was informed of the importance of frequent follow up visits to maximize her success with intensive lifestyle modifications for her multiple health conditions.  ALLERGIES: Allergies  Allergen Reactions  . Sulfonamide Derivatives     REACTION: rash  MEDICATIONS: Current Outpatient Medications on File Prior to Visit  Medication Sig Dispense Refill  . Cholecalciferol (VITAMIN D3) 50000 units CAPS Take 1 capsule by mouth every 7 (seven) days. 12 capsule 0  . fexofenadine (ALLEGRA) 180 MG tablet Take 180 mg by mouth daily.    . fluticasone (FLONASE) 50 MCG/ACT nasal spray Place 1 spray into both nostrils daily.    . metFORMIN (GLUCOPHAGE) 500 MG tablet TAKE 1 TABLET BY MOUTH TWICE DAILY WITH MEALS 60 tablet 0  . polyethylene glycol powder (GLYCOLAX/MIRALAX) powder Take 17 g by mouth daily. 3350 g 0  . Vitamin D, Ergocalciferol, (DRISDOL) 1.25 MG (50000 UT) CAPS capsule Take 1 capsule (50,000 Units total) by mouth every 7 (seven) days. 4 capsule 0   No current facility-administered medications on file prior to  visit.     PAST MEDICAL HISTORY: Past Medical History:  Diagnosis Date  . Dry skin   . Fatigue   . Rupture of ovarian cyst   . Seasonal allergies   . Swelling of extremity   . Trouble in sleeping   . Vitamin D deficiency     PAST SURGICAL HISTORY: History reviewed. No pertinent surgical history.  SOCIAL HISTORY: Social History   Tobacco Use  . Smoking status: Never Smoker  . Smokeless tobacco: Never Used  Substance Use Topics  . Alcohol use: Yes    Alcohol/week: 0.0 standard drinks    Comment: socially  . Drug use: No    FAMILY HISTORY: Family History  Problem Relation Age of Onset  . Cancer Mother   . Obesity Father     ROS: Review of Systems  Respiratory: Negative for shortness of breath.   Cardiovascular: Negative for chest pain.  Gastrointestinal: Negative for nausea and vomiting.  Musculoskeletal:       Negative for muscle weakness.  Endo/Heme/Allergies:       Negative for polyphagia.  Psychiatric/Behavioral: Positive for depression.    PHYSICAL EXAM: Pt in no acute distress  RECENT LABS AND TESTS: BMET    Component Value Date/Time   NA 138 04/19/2018 0834   K 4.3 04/19/2018 0834   CL 102 04/19/2018 0834   CO2 22 04/19/2018 0834   GLUCOSE 88 04/19/2018 0834   GLUCOSE 95 06/25/2017 0854   BUN 18 04/19/2018 0834   CREATININE 0.83 04/19/2018 0834   CREATININE 0.63 12/01/2014 1532   CALCIUM 9.2 04/19/2018 0834   GFRNONAA 86 04/19/2018 0834   GFRAA 99 04/19/2018 0834   Lab Results  Component Value Date   HGBA1C 5.4 04/19/2018   HGBA1C 5.3 12/21/2017   HGBA1C 5.5 08/26/2017   HGBA1C 5.3 05/12/2008   Lab Results  Component Value Date   INSULIN 7.9 04/19/2018   INSULIN 8.6 12/21/2017   INSULIN 10.3 08/26/2017   CBC    Component Value Date/Time   WBC 7.5 04/19/2018 0834   WBC 5.9 07/14/2017 0905   RBC 4.38 04/19/2018 0834   RBC 4.19 07/14/2017 0905   HGB 13.8 04/19/2018 0834   HCT 41.4 04/19/2018 0834   PLT 262 04/19/2018 0834    MCV 95 04/19/2018 0834   MCH 31.5 04/19/2018 0834   MCH 31.2 12/01/2014 1532   MCHC 33.3 04/19/2018 0834   MCHC 33.8 07/14/2017 0905   RDW 12.2 (L) 04/19/2018 0834   LYMPHSABS 1.9 04/19/2018 0834   MONOABS 0.4 07/14/2017 0905   EOSABS 0.6 (H) 04/19/2018 0834   BASOSABS 0.1 04/19/2018 0834   Iron/TIBC/Ferritin/ %Sat No results found for: IRON, TIBC, FERRITIN,  IRONPCTSAT Lipid Panel     Component Value Date/Time   CHOL 192 04/19/2018 0834   TRIG 96 04/19/2018 0834   HDL 52 04/19/2018 0834   CHOLHDL 3.7 04/19/2018 0834   CHOLHDL 3 06/25/2017 0854   VLDL 10.8 06/25/2017 0854   LDLCALC 121 (H) 04/19/2018 0834   Hepatic Function Panel     Component Value Date/Time   PROT 6.9 04/19/2018 0834   ALBUMIN 4.2 04/19/2018 0834   AST 15 04/19/2018 0834   ALT 19 04/19/2018 0834   ALKPHOS 99 04/19/2018 0834   BILITOT 0.3 04/19/2018 0834   BILIDIR 0.1 05/12/2008 0942      Component Value Date/Time   TSH 4.03 07/14/2017 0905   TSH 1.688 12/01/2014 1532   TSH 1.93 05/12/2008 0942   Results for DENECE, SHEARER (MRN [id number]) as of 09/06/2018 06:51  Ref. Range 04/19/2018 08:34  Vitamin D, 25-Hydroxy Latest Ref Range: 30.0 - 100.0 ng/mL 47.1     I, Marcille Blanco, CMA, am acting as Location manager for [employer], FNP-C.  I have reviewed the above documentation for accuracy and completeness, and I agree with the above.  -  , FNP-C.

## 2018-09-07 ENCOUNTER — Encounter (INDEPENDENT_AMBULATORY_CARE_PROVIDER_SITE_OTHER): Payer: Self-pay | Admitting: Family Medicine

## 2018-09-20 ENCOUNTER — Encounter (INDEPENDENT_AMBULATORY_CARE_PROVIDER_SITE_OTHER): Payer: Self-pay | Admitting: Family Medicine

## 2018-09-20 ENCOUNTER — Other Ambulatory Visit: Payer: Self-pay

## 2018-09-20 ENCOUNTER — Ambulatory Visit (INDEPENDENT_AMBULATORY_CARE_PROVIDER_SITE_OTHER): Payer: 59 | Admitting: Family Medicine

## 2018-09-20 DIAGNOSIS — E8881 Metabolic syndrome: Secondary | ICD-10-CM

## 2018-09-20 DIAGNOSIS — E559 Vitamin D deficiency, unspecified: Secondary | ICD-10-CM | POA: Diagnosis not present

## 2018-09-20 DIAGNOSIS — F3289 Other specified depressive episodes: Secondary | ICD-10-CM

## 2018-09-20 DIAGNOSIS — I10 Essential (primary) hypertension: Secondary | ICD-10-CM

## 2018-09-20 DIAGNOSIS — Z6839 Body mass index (BMI) 39.0-39.9, adult: Secondary | ICD-10-CM

## 2018-09-20 MED ORDER — VITAMIN D (ERGOCALCIFEROL) 1.25 MG (50000 UNIT) PO CAPS: 50000.0000 [IU] | ORAL_CAPSULE | ORAL | 0 refills | Status: DC

## 2018-09-20 MED ORDER — HYDROCHLOROTHIAZIDE 25 MG PO TABS: 25.0000 mg | ORAL_TABLET | Freq: Every day | ORAL | 0 refills | Status: DC

## 2018-09-20 MED ORDER — BUPROPION HCL ER (SR) 200 MG PO TB12: 200.0000 mg | ORAL_TABLET | Freq: Every day | ORAL | 0 refills | Status: DC

## 2018-09-20 MED ORDER — METFORMIN HCL 500 MG PO TABS: 500.0000 mg | ORAL_TABLET | Freq: Two times a day (BID) | ORAL | 0 refills | Status: DC

## 2018-09-20 MED FILL — HYDROCHLOROTHIAZIDE 25 MG T: 25 | 30 days supply | Qty: 30 | Fill #0

## 2018-09-20 MED FILL — BUPROPION HCL SR 200 MG TAB: 200 | 30 days supply | Qty: 30 | Fill #0

## 2018-09-20 MED FILL — metFORMIN HCL 500 MG TABS: 500 | 30 days supply | Qty: 60 | Fill #0

## 2018-09-20 MED FILL — VIT D2 1.25 MG (50,000 UNIT: 1.25 MG | 28 days supply | Qty: 4 | Fill #0

## 2018-09-20 NOTE — Progress Notes (Signed)
Office: [phone]  /  Fax: [phone] TeleHealth Visit:  Wanda Peters has verbally consented to this TeleHealth visit today. The patient is located at work, the provider is located at the [employer] [facility] office. The participants in this visit include the listed provider and patient. The visit was conducted today via FaceTime.  HPI:   Chief Complaint: OBESITY Wanda Peters is here to discuss her progress with her obesity treatment plan. She is keeping a food journal with 1500 calories and 85 grams of protein and is following hereating plan approximately 50% of the time. She states she is walking 20-30 minutes 3-5 times per week. Wanda Peters states she weighed 258 lbs reflecting a 1 lb weight gain. She reports food availability has improved. She states her husband is doing the shopping and she reports not making good food choices recently. In addition, she states she is not always getting her protein in.  She does report polyphagia.  We were unable to weigh the patient today for this TeleHealth visit. She feels as if she has gained 1 lb since her last visit. She has lost 24 lbs since starting treatment with [name].  Hypertension Wanda Peters is a 43 y.o. female with hypertension, well controlled on HCTZ.  Wanda Peters denies chest pain or shortness of breath on exertion. She is working weight loss to help control her blood pressure with the goal of decreasing her risk of heart attack and stroke. Wanda Peters's last blood pressure was 112/58.  Depression with emotional eating behaviors Wanda Peters is struggling with emotional eating and using food for comfort to the extent that it is negatively impacting her health.  She has been working on behavior modification techniques to help reduce her emotional eating and has been somewhat successful. She shows no sign of suicidal or homicidal ideations. Wanda Peters reports her stress eating is well controlled.  Depression screen  [facility] 2/9 08/26/2017 06/30/2017  Decreased Interest 1 0  Down, Depressed, Hopeless 1 0  PHQ - 2 Score 2 0  Altered sleeping 2 -  Tired, decreased energy 3 -  Change in appetite 2 -  Feeling bad or failure about yourself  1 -  Trouble concentrating 0 -  Moving slowly or fidgety/restless 0 -  Suicidal thoughts 0 -  PHQ-9 Score 10 -  Difficult doing work/chores Not difficult at all -   Insulin Resistance Wanda Peters has a diagnosis of insulin resistance based on her elevated fasting insulin level >5. Although Wanda Peters's blood glucose readings are still under good control, insulin resistance puts her at greater risk of metabolic syndrome and diabetes. She is taking metformin currently BID but forgets to take it. She continues to work on diet and exercise to decrease risk of diabetes. She reports polyphagia.  Vitamin D deficiency Wanda Peters has a diagnosis of Vitamin D deficiency, which is not at goal. Her last Vitamin D level was reported to be 47.1 on 04/19/2018. She is currently taking prescription Vit D and denies nausea, vomiting or muscle weakness.  ASSESSMENT AND PLAN:  Essential hypertension - Plan: hydrochlorothiazide (HYDRODIURIL) 25 MG tablet  Insulin resistance - Plan: metFORMIN (GLUCOPHAGE) 500 MG tablet  Vitamin D deficiency - Plan: Vitamin D, Ergocalciferol, (DRISDOL) 1.25 MG (50000 UT) CAPS capsule  Other depression - with emotional eating - Plan: buPROPion (WELLBUTRIN SR) 200 MG 12 hr tablet  Class 2 severe obesity with serious comorbidity and body mass index (BMI) of 39.0 to 39.9 in adult, unspecified obesity type (Wanda Peters)  PLAN:  Hypertension  We discussed sodium restriction, working on healthy weight loss, and a regular exercise program as the means to achieve improved blood pressure control. Wanda Peters agreed with this plan and agreed to follow up as directed. We will continue to monitor her blood pressure as well as her progress with the above lifestyle modifications. She was  given a refill on her HCTZ 25 mg QD #30 with 0 refills and agrees to follow-up with our clinic in 2 weeks. She will have labs checked and will watch for signs of hypotension as she continues her lifestyle modifications.  Depression with Emotional Eating Behaviors We discussed behavior modification techniques today to help Wanda Peters deal with her emotional eating and depression. She was given a refill on her bupropion 200 mg QAM #30 with 0 refills and will follow-up with our clinic in 2 weeks.  Insulin Resistance Athziry will continue to work on weight loss, exercise, and decreasing simple carbohydrates in her diet to help decrease the risk of diabetes. We dicussed metformin including benefits and risks. She was informed that eating too many simple carbohydrates or too many calories at one sitting increases the likelihood of GI side effects. Wanda Peters is currently taking metformin and a refill prescription was written today for 500 mg BID #60 with 0 refills. She agrees to follow-up with our clinic in 2 weeks. She was advised to increase her protein.  Vitamin D Deficiency Wanda Peters was informed that low Vitamin D levels contributes to fatigue and are associated with obesity, breast, and colon cancer. She agrees to continue to take prescription Vit D @ 50,000 IU every week #4 with no refills and will follow-up for routine testing of Vitamin D. She was informed of the risk of over-replacement of Vitamin D and agrees to not increase her dose unless she discusses this with [name] first. Wanda Peters agrees to follow-up with our clinic in 2 weeks.  Obesity Wanda Peters is currently in the action stage of change. As such, her goal is to continue with weight loss efforts. She was told to increase calroues to 1600 per day if she is hungry. She has agreed to keep a food journal with 1500-1600 calories and 85 grams of protein.  Wanda Peters has been instructed to continue her current exercise regimen for weight loss and  overall health benefits. We discussed the following Behavioral Modification Strategies today: increasing lean protein intake, work on meal planning and easy cooking plans, keeping healthy foods in the home, planning for success, and keep a strict food journal.  Wanda Peters has agreed to followup with our clinic in 2 weeks. She was informed of the importance of frequent follow-up visits to maximize her success with intensive lifestyle modifications for her multiple health conditions.  ALLERGIES: Allergies  Allergen Reactions  . Sulfonamide Derivatives     REACTION: rash    MEDICATIONS: Current Outpatient Medications on File Prior to Visit  Medication Sig Dispense Refill  . Cholecalciferol (VITAMIN D3) 50000 units CAPS Take 1 capsule by mouth every 7 (seven) days. 12 capsule 0  . fexofenadine (ALLEGRA) 180 MG tablet Take 180 mg by mouth daily.    . fluticasone (FLONASE) 50 MCG/ACT nasal spray Place 1 spray into both nostrils daily.    . polyethylene glycol powder (GLYCOLAX/MIRALAX) powder Take 17 g by mouth daily. 3350 g 0   No current facility-administered medications on file prior to visit.     PAST MEDICAL HISTORY: Past Medical History:  Diagnosis Date  . Dry skin   . Fatigue   . Rupture  of ovarian cyst   . Seasonal allergies   . Swelling of extremity   . Trouble in sleeping   . Vitamin D deficiency     PAST SURGICAL HISTORY: History reviewed. No pertinent surgical history.  SOCIAL HISTORY: Social History   Tobacco Use  . Smoking status: Never Smoker  . Smokeless tobacco: Never Used  Substance Use Topics  . Alcohol use: Yes    Alcohol/week: 0.0 standard drinks    Comment: socially  . Drug use: No    FAMILY HISTORY: Family History  Problem Relation Age of Onset  . Cancer Mother   . Obesity Father    ROS: Review of Systems  Respiratory: Negative for shortness of breath.   Cardiovascular: Negative for chest pain.  Gastrointestinal: Negative for nausea and  vomiting.  Musculoskeletal:       Negative for muscle weakness.  Endo/Heme/Allergies:       Positive for polyphagia.  Psychiatric/Behavioral: Positive for depression (emotional eating).   PHYSICAL EXAM: Pt in no acute distress  RECENT LABS AND TESTS: BMET    Component Value Date/Time   NA 138 04/19/2018 0834   K 4.3 04/19/2018 0834   CL 102 04/19/2018 0834   CO2 22 04/19/2018 0834   GLUCOSE 88 04/19/2018 0834   GLUCOSE 95 06/25/2017 0854   BUN 18 04/19/2018 0834   CREATININE 0.83 04/19/2018 0834   CREATININE 0.63 12/01/2014 1532   CALCIUM 9.2 04/19/2018 0834   GFRNONAA 86 04/19/2018 0834   GFRAA 99 04/19/2018 0834   Lab Results  Component Value Date   HGBA1C 5.4 04/19/2018   HGBA1C 5.3 12/21/2017   HGBA1C 5.5 08/26/2017   HGBA1C 5.3 05/12/2008   Lab Results  Component Value Date   INSULIN 7.9 04/19/2018   INSULIN 8.6 12/21/2017   INSULIN 10.3 08/26/2017   CBC    Component Value Date/Time   WBC 7.5 04/19/2018 0834   WBC 5.9 07/14/2017 0905   RBC 4.38 04/19/2018 0834   RBC 4.19 07/14/2017 0905   HGB 13.8 04/19/2018 0834   HCT 41.4 04/19/2018 0834   PLT 262 04/19/2018 0834   MCV 95 04/19/2018 0834   MCH 31.5 04/19/2018 0834   MCH 31.2 12/01/2014 1532   MCHC 33.3 04/19/2018 0834   MCHC 33.8 07/14/2017 0905   RDW 12.2 (L) 04/19/2018 0834   LYMPHSABS 1.9 04/19/2018 0834   MONOABS 0.4 07/14/2017 0905   EOSABS 0.6 (H) 04/19/2018 0834   BASOSABS 0.1 04/19/2018 0834   Iron/TIBC/Ferritin/ %Sat No results found for: IRON, TIBC, FERRITIN, IRONPCTSAT Lipid Panel     Component Value Date/Time   CHOL 192 04/19/2018 0834   TRIG 96 04/19/2018 0834   HDL 52 04/19/2018 0834   CHOLHDL 3.7 04/19/2018 0834   CHOLHDL 3 06/25/2017 0854   VLDL 10.8 06/25/2017 0854   LDLCALC 121 (H) 04/19/2018 0834   Hepatic Function Panel     Component Value Date/Time   PROT 6.9 04/19/2018 0834   ALBUMIN 4.2 04/19/2018 0834   AST 15 04/19/2018 0834   ALT 19 04/19/2018 0834    ALKPHOS 99 04/19/2018 0834   BILITOT 0.3 04/19/2018 0834   BILIDIR 0.1 05/12/2008 0942      Component Value Date/Time   TSH 4.03 07/14/2017 0905   TSH 1.688 12/01/2014 1532   TSH 1.93 05/12/2008 0942   Results for TIASHA, HELVIE (MRN [id number]) as of 09/20/2018 14:41  Ref. Range 04/19/2018 08:34  Vitamin D, 25-Hydroxy Latest Ref Range: 30.0 - 100.0 ng/mL 47.1  IMichaelene Song, am acting as Location manager for [employer], FNP-C.  I have reviewed the above documentation for accuracy and completeness, and I agree with the above.  - Maris Bena, FNP-C.

## 2018-09-21 ENCOUNTER — Encounter (INDEPENDENT_AMBULATORY_CARE_PROVIDER_SITE_OTHER): Payer: Self-pay

## 2018-09-21 ENCOUNTER — Encounter (INDEPENDENT_AMBULATORY_CARE_PROVIDER_SITE_OTHER): Payer: Self-pay | Admitting: Family Medicine

## 2018-09-30 ENCOUNTER — Other Ambulatory Visit (HOSPITAL_COMMUNITY)
Admission: RE | Admit: 2018-09-30 | Discharge: 2018-09-30 | Disposition: A | Discharge status: Home / Self Care | Payer: 59 | Source: Ambulatory Visit | Attending: *Deleted | Admitting: *Deleted

## 2018-09-30 ENCOUNTER — Other Ambulatory Visit (HOSPITAL_COMMUNITY)
Admission: RE | Admit: 2018-09-30 | Discharge: 2018-09-30 | Disposition: A | Discharge status: Home / Self Care | Payer: 59 | Attending: Family Medicine | Admitting: Family Medicine

## 2018-09-30 ENCOUNTER — Encounter (INDEPENDENT_AMBULATORY_CARE_PROVIDER_SITE_OTHER): Payer: Self-pay | Admitting: Family Medicine

## 2018-09-30 DIAGNOSIS — E7849 Other hyperlipidemia: Secondary | ICD-10-CM | POA: Diagnosis not present

## 2018-09-30 DIAGNOSIS — E8881 Metabolic syndrome: Secondary | ICD-10-CM | POA: Diagnosis not present

## 2018-09-30 DIAGNOSIS — E559 Vitamin D deficiency, unspecified: Secondary | ICD-10-CM | POA: Diagnosis not present

## 2018-09-30 DIAGNOSIS — I10 Essential (primary) hypertension: Secondary | ICD-10-CM | POA: Insufficient documentation

## 2018-09-30 LAB — COMPREHENSIVE METABOLIC PANEL
ALT: 25 U/L (ref 0–44)
AST: 20 U/L (ref 15–41)
Albumin: 4 g/dL (ref 3.5–5.0)
Alkaline Phosphatase: 78 U/L (ref 38–126)
Anion gap: 9 (ref 5–15)
BUN: 17 mg/dL (ref 6–20)
CO2: 22 mmol/L (ref 22–32)
Calcium: 8.9 mg/dL (ref 8.9–10.3)
Chloride: 108 mmol/L (ref 98–111)
Creatinine, Ser: 0.7 mg/dL (ref 0.44–1.00)
GFR calc Af Amer: >60 mL/min (ref >60)
GFR calc non Af Amer: >60 mL/min (ref >60)
Glucose, Bld: 106 mg/dL — ABNORMAL HIGH (ref 70–99)
Potassium: 4 mmol/L (ref 3.5–5.1)
Sodium: 139 mmol/L (ref 135–145)
Total Bilirubin: 0.3 mg/dL (ref 0.3–1.2)
Total Protein: 7.1 g/dL (ref 6.5–8.1)

## 2018-09-30 LAB — LIPID PANEL
Cholesterol: 179 mg/dL (ref 0–200)
HDL: 52 mg/dL (ref >40)
LDL Cholesterol: 116 mg/dL — ABNORMAL HIGH (ref 0–99)
Total CHOL/HDL Ratio: 3.4 RATIO
Triglycerides: 54 mg/dL (ref <150)
VLDL: 11 mg/dL (ref 0–40)

## 2018-09-30 LAB — HEMOGLOBIN A1C
Hgb A1c MFr Bld: 5.4 % (ref 4.8–5.6)
Mean Plasma Glucose: 108.28 mg/dL

## 2018-10-01 LAB — INSULIN, RANDOM: Insulin: 9.6 u[IU]/mL (ref 2.6–24.9)

## 2018-10-01 LAB — VITAMIN D 25 HYDROXY (VIT D DEFICIENCY, FRACTURES): Vit D, 25-Hydroxy: 47.5 ng/mL (ref 30.0–100.0)

## 2018-10-04 ENCOUNTER — Other Ambulatory Visit: Payer: Self-pay

## 2018-10-04 ENCOUNTER — Encounter (INDEPENDENT_AMBULATORY_CARE_PROVIDER_SITE_OTHER): Payer: Self-pay | Admitting: Family Medicine

## 2018-10-04 ENCOUNTER — Ambulatory Visit (INDEPENDENT_AMBULATORY_CARE_PROVIDER_SITE_OTHER): Payer: 59 | Admitting: Family Medicine

## 2018-10-04 DIAGNOSIS — E8881 Metabolic syndrome: Secondary | ICD-10-CM

## 2018-10-04 DIAGNOSIS — Z6839 Body mass index (BMI) 39.0-39.9, adult: Secondary | ICD-10-CM | POA: Diagnosis not present

## 2018-10-04 NOTE — Progress Notes (Signed)
Office: [phone]  /  Fax: [phone] TeleHealth Visit:  Wanda Peters has verbally consented to this TeleHealth visit today. The patient is located at work, the provider is located at the [employer] [facility] office. The participants in this visit include the listed provider and patient. The visit was conducted today via FaceTime.  HPI:   Chief Complaint: OBESITY Wanda Peters is here to discuss her progress with her obesity treatment plan. She is keeping a food journal with 1500-1600 calories and 85 grams of protein and is following her eating plan approximately 50% of the time. She states she is exercising 0 minutes 0 times per week. Wanda Peters states she does not always eat enough. 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 She states she is maintaining her weight; she does not always have time to journal. We were unable to weigh the patient today for this TeleHealth visit. She feels as if she has maintained her weight since her last visit. She has lost 24 lbs since starting treatment with [name].  Insulin Resistance Wanda Peters has a diagnosis of insulin resistance based on her elevated fasting insulin level >5. She reports fasting glucose elevated at 106. Her A1c is 5.4 and stable. Although Wanda Peters's blood glucose readings are still under good control, insulin resistance puts her at greater risk of metabolic syndrome and diabetes. She is taking metformin currently and continues to work on diet and exercise to decrease risk of diabetes. No polyphagia. Lab Results  Component Value Date   HGBA1C 5.4 09/30/2018    ASSESSMENT AND PLAN:  Insulin resistance  Class 2 severe obesity with serious comorbidity and body mass index (BMI) of 39.0 to 39.9 in adult, unspecified obesity type ([facility])  PLAN:  Insulin Resistance Wanda Peters will continue to work on weight loss, exercise, and decreasing simple carbohydrates in her diet to  help decrease the risk of diabetes. We dicussed metformin including benefits and risks. She was informed that eating too many simple carbohydrates or too many calories at one sitting increases the likelihood of GI side effects. Esbeydi will continue metformin and decrease simple carbs. Mykenzi agreed to follow-up with [name] as directed to monitor her progress.  Obesity Wanda Peters is currently in the action stage of change. As such, her goal is to continue with weight loss efforts. She has agreed to keep a food journal with 1500-1600 calories and 85-90 grams of protein daily. We discussed the following Behavioral Modification Strategies today: increasing lean protein intake, decreasing simple carbohydrates, and no skipping meals.   Wanda Peters has agreed to follow-up with our clinic in 2-3 weeks. She was informed of the importance of frequent follow-up visits to maximize her success with intensive lifestyle modifications for her multiple health conditions.  ALLERGIES: Allergies  Allergen Reactions  . Sulfonamide Derivatives     REACTION: rash    MEDICATIONS: Current Outpatient Medications on File Prior to Visit  Medication Sig Dispense Refill  . buPROPion (WELLBUTRIN SR) 200 MG 12 hr tablet Take 1 tablet (200 mg total) by mouth daily. 30 tablet 0  . Cholecalciferol (VITAMIN D3) 50000 units CAPS Take 1 capsule by mouth every 7 (seven) days. 12 capsule 0  . fexofenadine (ALLEGRA) 180 MG tablet Take 180 mg by mouth daily.    . fluticasone (FLONASE) 50 MCG/ACT nasal spray Place 1 spray into both nostrils daily.    . hydrochlorothiazide (HYDRODIURIL) 25 MG tablet Take 1 tablet (25 mg total) by mouth daily. 30 tablet  0  . metFORMIN (GLUCOPHAGE) 500 MG tablet Take 1 tablet (500 mg total) by mouth 2 (two) times daily with a meal. 60 tablet 0  . polyethylene glycol powder (GLYCOLAX/MIRALAX) powder Take 17 g by mouth daily. 3350 g 0  . Vitamin D, Ergocalciferol, (DRISDOL) 1.25 MG (50000 UT) CAPS  capsule Take 1 capsule (50,000 Units total) by mouth every 7 (seven) days. 4 capsule 0   No current facility-administered medications on file prior to visit.     PAST MEDICAL HISTORY: Past Medical History:  Diagnosis Date  . Dry skin   . Fatigue   . Rupture of ovarian cyst   . Seasonal allergies   . Swelling of extremity   . Trouble in sleeping   . Vitamin D deficiency     PAST SURGICAL HISTORY: History reviewed. No pertinent surgical history.  SOCIAL HISTORY: Social History   Tobacco Use  . Smoking status: Never Smoker  . Smokeless tobacco: Never Used  Substance Use Topics  . Alcohol use: Yes    Alcohol/week: 0.0 standard drinks    Comment: socially  . Drug use: No    FAMILY HISTORY: Family History  Problem Relation Age of Onset  . Cancer Mother   . Obesity Father    ROS: Review of Systems  Endo/Heme/Allergies:       Negative for polyphagia.   PHYSICAL EXAM: Pt in no acute distress  RECENT LABS AND TESTS: BMET    Component Value Date/Time   NA 139 09/30/2018 0852   NA 138 04/19/2018 0834   K 4.0 09/30/2018 0852   CL 108 09/30/2018 0852   CO2 22 09/30/2018 0852   GLUCOSE 106 (H) 09/30/2018 0852   BUN 17 09/30/2018 0852   BUN 18 04/19/2018 0834   CREATININE 0.70 09/30/2018 0852   CREATININE 0.63 12/01/2014 1532   CALCIUM 8.9 09/30/2018 0852   GFRNONAA >60 09/30/2018 0852   GFRAA >60 09/30/2018 0852   Lab Results  Component Value Date   HGBA1C 5.4 09/30/2018   HGBA1C 5.4 04/19/2018   HGBA1C 5.3 12/21/2017   HGBA1C 5.5 08/26/2017   HGBA1C 5.3 05/12/2008   Lab Results  Component Value Date   INSULIN 7.9 04/19/2018   INSULIN 8.6 12/21/2017   INSULIN 10.3 08/26/2017   CBC    Component Value Date/Time   WBC 7.5 04/19/2018 0834   WBC 5.9 07/14/2017 0905   RBC 4.38 04/19/2018 0834   RBC 4.19 07/14/2017 0905   HGB 13.8 04/19/2018 0834   HCT 41.4 04/19/2018 0834   PLT 262 04/19/2018 0834   MCV 95 04/19/2018 0834   MCH 31.5 04/19/2018 0834    MCH 31.2 12/01/2014 1532   MCHC 33.3 04/19/2018 0834   MCHC 33.8 07/14/2017 0905   RDW 12.2 (L) 04/19/2018 0834   LYMPHSABS 1.9 04/19/2018 0834   MONOABS 0.4 07/14/2017 0905   EOSABS 0.6 (H) 04/19/2018 0834   BASOSABS 0.1 04/19/2018 0834   Iron/TIBC/Ferritin/ %Sat No results found for: IRON, TIBC, FERRITIN, IRONPCTSAT Lipid Panel     Component Value Date/Time   CHOL 179 09/30/2018 0852   CHOL 192 04/19/2018 0834   TRIG 54 09/30/2018 0852   HDL 52 09/30/2018 0852   HDL 52 04/19/2018 0834   CHOLHDL 3.4 09/30/2018 0852   VLDL 11 09/30/2018 0852   LDLCALC 116 (H) 09/30/2018 0852   LDLCALC 121 (H) 04/19/2018 0834   Hepatic Function Panel     Component Value Date/Time   PROT 7.1 09/30/2018 0852   PROT  6.9 04/19/2018 0834   ALBUMIN 4.0 09/30/2018 0852   ALBUMIN 4.2 04/19/2018 0834   AST 20 09/30/2018 0852   ALT 25 09/30/2018 0852   ALKPHOS 78 09/30/2018 0852   BILITOT 0.3 09/30/2018 0852   BILITOT 0.3 04/19/2018 0834   BILIDIR 0.1 05/12/2008 0942      Component Value Date/Time   TSH 4.03 07/14/2017 0905   TSH 1.688 12/01/2014 1532   TSH 1.93 05/12/2008 0942   Results for VICY, MEDICO (MRN [id number]) as of 10/04/2018 16:31  Ref. Range 09/30/2018 08:52  Vitamin D, 25-Hydroxy Latest Ref Range: 30.0 - 100.0 ng/mL 47.5   I, Michaelene Song, am acting as Location manager for [employer], FNP-C.  I have reviewed the above documentation for accuracy and completeness, and I agree with the above.  - Tianni Escamilla, FNP-C.

## 2018-10-05 ENCOUNTER — Encounter (INDEPENDENT_AMBULATORY_CARE_PROVIDER_SITE_OTHER): Payer: Self-pay | Admitting: Family Medicine

## 2018-10-06 ENCOUNTER — Encounter (INDEPENDENT_AMBULATORY_CARE_PROVIDER_SITE_OTHER): Payer: Self-pay

## 2018-10-18 ENCOUNTER — Encounter (INDEPENDENT_AMBULATORY_CARE_PROVIDER_SITE_OTHER): Payer: Self-pay | Admitting: Family Medicine

## 2018-10-18 ENCOUNTER — Other Ambulatory Visit: Payer: Self-pay

## 2018-10-18 ENCOUNTER — Ambulatory Visit (INDEPENDENT_AMBULATORY_CARE_PROVIDER_SITE_OTHER): Payer: 59 | Admitting: Family Medicine

## 2018-10-18 DIAGNOSIS — E559 Vitamin D deficiency, unspecified: Secondary | ICD-10-CM | POA: Diagnosis not present

## 2018-10-18 DIAGNOSIS — F3289 Other specified depressive episodes: Secondary | ICD-10-CM | POA: Diagnosis not present

## 2018-10-18 DIAGNOSIS — Z6839 Body mass index (BMI) 39.0-39.9, adult: Secondary | ICD-10-CM

## 2018-10-19 ENCOUNTER — Encounter (INDEPENDENT_AMBULATORY_CARE_PROVIDER_SITE_OTHER): Payer: Self-pay | Admitting: Family Medicine

## 2018-10-19 NOTE — Progress Notes (Signed)
Office: [phone]  /  Fax: [phone] TeleHealth Visit:  [name] has verbally consented to this TeleHealth visit today. The patient is located at work, the provider is located at the [employer] [facility] office. The participants in this visit include the listed provider and patient. The visit was conducted today via FaceTime.  HPI:   Chief Complaint: OBESITY Bionca is here to discuss her progress with her obesity treatment plan. She is keeping a food journal with 1500-1600 calories and 85-90 grams of protein daily and is following her eating plan approximately 50% of the time. She states she is exercising 0 minutes 0 times per week. Brayden is happy she is maintaining her weight and reports stress eating is well controlled. Her barrier to staying on the plan is lunches being brought in to her hospital unit. We were unable to weigh the patient today for this TeleHealth visit. She feels as if she has maintained her weight since her last visit. She has lost 24 lbs since starting treatment with [name].  Vitamin D deficiency Ariatna has a diagnosis of Vitamin D deficiency and her level is currently at goal. She is currently taking prescription Vit D and denies nausea, vomiting or muscle weakness.  Depression with emotional eating behaviors Samiyah is struggling with emotional eating and using food for comfort to the extent that it is negatively impacting her health. She has been working on behavior modification techniques to help reduce her emotional eating and has been somewhat successful. She shows no sign of suicidal or homicidal ideations. Wanza feels this is well controlled on bupropion 200 mg daily.  Depression screen [facility] 2/9 08/26/2017 06/30/2017  Decreased Interest 1 0  Down, Depressed, Hopeless 1 0  PHQ - 2 Score 2 0  Altered sleeping 2 -  Tired, decreased energy 3 -  Change in appetite 2 -  Feeling bad or failure about yourself  1 -  Trouble  concentrating 0 -  Moving slowly or fidgety/restless 0 -  Suicidal thoughts 0 -  PHQ-9 Score 10 -  Difficult doing work/chores Not difficult at all -   ASSESSMENT AND PLAN:  Vitamin D deficiency  Other depression - with emotional eating  Class 2 severe obesity with serious comorbidity and body mass index (BMI) of 39.0 to 39.9 in adult, unspecified obesity type ([place])  PLAN:  Vitamin D Deficiency Zana was informed that low Vitamin D levels contributes to fatigue and are associated with obesity, breast, and colon cancer. She will discontinue prescription Vit D and start OTC Vitamin D3 2000 IU daily and will follow-up for routine testing of Vitamin D, at least 2-3 times per year. She was informed of the risk of over-replacement of Vitamin D and agrees to not increase her dose unless she discusses this with [name] first. Xylia agrees to follow-up with our clinic in 2 weeks.  Depression with Emotional Eating Behaviors We discussed behavior modification techniques today to help Kamarii deal with her emotional eating and depression. She has agreed to continue bupropion and agrees to follow-up as directed.  I spent > than 50% of the 15 minute visit on counseling as documented in the note.  Obesity Melenie is currently in the action stage of change. As such, her goal is to continue with weight loss efforts. She has agreed to keep a food journal with 1500-1600 calories and 85-90 grams of protein daily. We discussed the following Behavioral Modification Strategies today: increasing lean protein intake, decreasing simple carbohydrates, and planning for  success.  Siomara has agreed to follow up with our clinic in 2 weeks. She was informed of the importance of frequent follow up visits to maximize her success with intensive lifestyle modifications for her multiple health conditions.  ALLERGIES: Allergies  Allergen Reactions  . Sulfonamide Derivatives     REACTION: rash     MEDICATIONS: Current Outpatient Medications on File Prior to Visit  Medication Sig Dispense Refill  . buPROPion (WELLBUTRIN SR) 200 MG 12 hr tablet Take 1 tablet (200 mg total) by mouth daily. 30 tablet 0  . fexofenadine (ALLEGRA) 180 MG tablet Take 180 mg by mouth daily.    . fluticasone (FLONASE) 50 MCG/ACT nasal spray Place 1 spray into both nostrils daily.    . hydrochlorothiazide (HYDRODIURIL) 25 MG tablet Take 1 tablet (25 mg total) by mouth daily. 30 tablet 0  . metFORMIN (GLUCOPHAGE) 500 MG tablet Take 1 tablet (500 mg total) by mouth 2 (two) times daily with a meal. 60 tablet 0  . polyethylene glycol powder (GLYCOLAX/MIRALAX) powder Take 17 g by mouth daily. 3350 g 0   No current facility-administered medications on file prior to visit.     PAST MEDICAL HISTORY: Past Medical History:  Diagnosis Date  . Dry skin   . Fatigue   . Rupture of ovarian cyst   . Seasonal allergies   . Swelling of extremity   . Trouble in sleeping   . Vitamin D deficiency     PAST SURGICAL HISTORY: History reviewed. No pertinent surgical history.  SOCIAL HISTORY: Social History   Tobacco Use  . Smoking status: Never Smoker  . Smokeless tobacco: Never Used  Substance Use Topics  . Alcohol use: Yes    Alcohol/week: 0.0 standard drinks    Comment: socially  . Drug use: No    FAMILY HISTORY: Family History  Problem Relation Age of Onset  . Cancer Mother   . Obesity Father    ROS: Review of Systems  Gastrointestinal: Negative for nausea and vomiting.  Musculoskeletal:       Negative for muscle weakness.  Psychiatric/Behavioral: Positive for depression (emotional eating). Negative for suicidal ideas.       Negative for homicidal ideas.   PHYSICAL EXAM: Pt in no acute distress  RECENT LABS AND TESTS: BMET    Component Value Date/Time   NA 139 09/30/2018 0852   NA 138 04/19/2018 0834   K 4.0 09/30/2018 0852   CL 108 09/30/2018 0852   CO2 22 09/30/2018 0852   GLUCOSE 106 (H)  09/30/2018 0852   BUN 17 09/30/2018 0852   BUN 18 04/19/2018 0834   CREATININE 0.70 09/30/2018 0852   CREATININE 0.63 12/01/2014 1532   CALCIUM 8.9 09/30/2018 0852   GFRNONAA >60 09/30/2018 0852   GFRAA >60 09/30/2018 0852   Lab Results  Component Value Date   HGBA1C 5.4 09/30/2018   HGBA1C 5.4 04/19/2018   HGBA1C 5.3 12/21/2017   HGBA1C 5.5 08/26/2017   HGBA1C 5.3 05/12/2008   Lab Results  Component Value Date   INSULIN 7.9 04/19/2018   INSULIN 8.6 12/21/2017   INSULIN 10.3 08/26/2017   CBC    Component Value Date/Time   WBC 7.5 04/19/2018 0834   WBC 5.9 07/14/2017 0905   RBC 4.38 04/19/2018 0834   RBC 4.19 07/14/2017 0905   HGB 13.8 04/19/2018 0834   HCT 41.4 04/19/2018 0834   PLT 262 04/19/2018 0834   MCV 95 04/19/2018 0834   MCH 31.5 04/19/2018 0834   MCH  31.2 12/01/2014 1532   MCHC 33.3 04/19/2018 0834   MCHC 33.8 07/14/2017 0905   RDW 12.2 (L) 04/19/2018 0834   LYMPHSABS 1.9 04/19/2018 0834   MONOABS 0.4 07/14/2017 0905   EOSABS 0.6 (H) 04/19/2018 0834   BASOSABS 0.1 04/19/2018 0834   Iron/TIBC/Ferritin/ %Sat No results found for: IRON, TIBC, FERRITIN, IRONPCTSAT Lipid Panel     Component Value Date/Time   CHOL 179 09/30/2018 0852   CHOL 192 04/19/2018 0834   TRIG 54 09/30/2018 0852   HDL 52 09/30/2018 0852   HDL 52 04/19/2018 0834   CHOLHDL 3.4 09/30/2018 0852   VLDL 11 09/30/2018 0852   LDLCALC 116 (H) 09/30/2018 0852   LDLCALC 121 (H) 04/19/2018 0834   Hepatic Function Panel     Component Value Date/Time   PROT 7.1 09/30/2018 0852   PROT 6.9 04/19/2018 0834   ALBUMIN 4.0 09/30/2018 0852   ALBUMIN 4.2 04/19/2018 0834   AST 20 09/30/2018 0852   ALT 25 09/30/2018 0852   ALKPHOS 78 09/30/2018 0852   BILITOT 0.3 09/30/2018 0852   BILITOT 0.3 04/19/2018 0834   BILIDIR 0.1 05/12/2008 0942      Component Value Date/Time   TSH 4.03 07/14/2017 0905   TSH 1.688 12/01/2014 1532   TSH 1.93 05/12/2008 0942   Results for ARLIN, SAVONA  (MRN [id number]) as of 10/19/2018 09:18  Ref. Range 09/30/2018 08:52  Vitamin D, 25-Hydroxy Latest Ref Range: 30.0 - 100.0 ng/mL 47.5    I, Michaelene Song, am acting as Location manager for [employer], FNP-C.  I have reviewed the above documentation for accuracy and completeness, and I agree with the above.  - Paizley Ramella, FNP-C.

## 2018-10-21 ENCOUNTER — Other Ambulatory Visit (INDEPENDENT_AMBULATORY_CARE_PROVIDER_SITE_OTHER): Payer: Self-pay | Admitting: Family Medicine

## 2018-10-21 DIAGNOSIS — E8881 Metabolic syndrome: Secondary | ICD-10-CM

## 2018-10-21 MED FILL — HYDROCHLOROTHIAZIDE 25 MG T: 25 | 30 days supply | Qty: 30 | Fill #0

## 2018-10-21 MED FILL — BUPROPION HCL SR 200 MG TAB: 200 | 30 days supply | Qty: 30 | Fill #0

## 2018-11-02 ENCOUNTER — Ambulatory Visit (INDEPENDENT_AMBULATORY_CARE_PROVIDER_SITE_OTHER): Payer: 59 | Admitting: Family Medicine

## 2018-11-02 ENCOUNTER — Other Ambulatory Visit: Payer: Self-pay

## 2018-11-02 ENCOUNTER — Encounter (INDEPENDENT_AMBULATORY_CARE_PROVIDER_SITE_OTHER): Payer: Self-pay | Admitting: Family Medicine

## 2018-11-02 DIAGNOSIS — F3289 Other specified depressive episodes: Secondary | ICD-10-CM

## 2018-11-02 DIAGNOSIS — R609 Edema, unspecified: Secondary | ICD-10-CM | POA: Diagnosis not present

## 2018-11-02 DIAGNOSIS — Z6839 Body mass index (BMI) 39.0-39.9, adult: Secondary | ICD-10-CM

## 2018-11-02 DIAGNOSIS — E8881 Metabolic syndrome: Secondary | ICD-10-CM

## 2018-11-02 MED ORDER — INSULIN PEN NEEDLE 32G X 4 MM MISC: 1.0000 | Freq: Two times a day (BID) | 0 refills | Status: DC

## 2018-11-02 MED ORDER — LIRAGLUTIDE -WEIGHT MANAGEMENT 18 MG/3ML ~~LOC~~ SOPN: 3.0000 mg | PEN_INJECTOR | Freq: Every day | SUBCUTANEOUS | 0 refills | Status: DC

## 2018-11-02 MED ORDER — HYDROCHLOROTHIAZIDE 25 MG PO TABS: 25.0000 mg | ORAL_TABLET | Freq: Every day | ORAL | 0 refills | Status: DC

## 2018-11-02 MED ORDER — BUPROPION HCL ER (SR) 200 MG PO TB12: 200.0000 mg | ORAL_TABLET | Freq: Every day | ORAL | 0 refills | Status: DC

## 2018-11-03 ENCOUNTER — Encounter (INDEPENDENT_AMBULATORY_CARE_PROVIDER_SITE_OTHER): Payer: Self-pay | Admitting: Family Medicine

## 2018-11-03 DIAGNOSIS — R609 Edema, unspecified: Secondary | ICD-10-CM | POA: Insufficient documentation

## 2018-11-03 DIAGNOSIS — R600 Localized edema: Secondary | ICD-10-CM | POA: Insufficient documentation

## 2018-11-03 NOTE — Progress Notes (Addendum)
Office: [phone]  /  Fax: [phone] TeleHealth Visit:  Wanda Peters has verbally consented to this TeleHealth visit today. The patient is located at work, the provider is located at the [employer] [facility] office. The participants in this visit include the listed provider and patient. The visit was conducted today via FaceTime.  HPI:   Chief Complaint: OBESITY Wanda Peters is here to discuss her progress with her obesity treatment plan. She is keeping a food journal with 1500-1600 calories and 85-90 grams of protein daily and is following her eating plan approximately 50% of the time. She states she is exercising 0 minutes 0 times per week. Wanda Peters states her weight fluctuates between 257 and 260 lbs. She reports being on the plan about half the time. 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 She does report polyphagia.  We were unable to weigh the patient today for this TeleHealth visit. She feels as if she has maintained her weight since her last visit. She has lost 24 lbs since starting treatment with [name].  Depression with emotional eating behaviors Wanda Peters is struggling with emotional eating and using food for comfort to the extent that it is negatively impacting her health.  Wanda Peters feels bupropion is helping with her emotional eating. Her mood is stable. She shows no sign of suicidal or homicidal ideations.  Depression screen [facility] 2/9 08/26/2017 06/30/2017  Decreased Interest 1 0  Down, Depressed, Hopeless 1 0  PHQ - 2 Score 2 0  Altered sleeping 2 -  Tired, decreased energy 3 -  Change in appetite 2 -  Feeling bad or failure about yourself  1 -  Trouble concentrating 0 -  Moving slowly or fidgety/restless 0 -  Suicidal thoughts 0 -  PHQ-9 Score 10 -  Difficult doing work/chores Not difficult at all -   Peripheral Edema Terri reports her peripheral edema is well  controlled with HCTZ. She denies chest pain or shortness of breath.  Insulin Resistance Wanda Peters has a diagnosis of insulin resistance based on her elevated fasting insulin level >5. Although Wanda Peters's blood glucose readings are still under good control, insulin resistance puts her at greater risk of metabolic syndrome and diabetes. She is taking metformin currently and continues to work on diet and exercise to decrease risk of diabetes. She reports polyphagia.  ASSESSMENT AND PLAN:  Essential hypertension - Plan: hydrochlorothiazide (HYDRODIURIL) 25 MG tablet  Insulin resistance  Other depression - with emotional eating - Plan: buPROPion (WELLBUTRIN SR) 200 MG 12 hr tablet  Class 2 severe obesity with serious comorbidity and body mass index (BMI) of 39.0 to 39.9 in adult, unspecified obesity type ([place]) - Plan: Liraglutide -Weight Management (SAXENDA) 18 MG/3ML SOPN, Insulin Pen Needle (BD PEN NEEDLE NANO 2ND GEN) 32G X 4 MM MISC  PLAN:  Depression with Emotional Eating Behaviors We discussed behavior modification techniques today to help Daizha deal with her emotional eating and depression. She has agreed to take Wellbutrin SR 150 mg qd and agreed to follow up as directed.  Peripheral Edema Wanda Peters was given a refill on her HCTZ 25 mg QD #30 with 0 refills and agrees to follow-up with our clinic in 2-3 weeks.  Insulin Resistance Wanda Peters will continue to work on weight loss, exercise, and decreasing simple carbohydrates in her diet to help decrease the risk of diabetes. We dicussed metformin including benefits and risks. She was informed that  eating too many simple carbohydrates or too many calories at one sitting increases the likelihood of GI side effects. Norie wishes to discontinue metformin if she starts [name] and I agreed. She agrees to follow-up with our clinic in 2-3 weeks.  Obesity Wanda Peters is currently in the action stage of change. As such, her goal is to  continue with weight loss efforts. She has agreed to keep a food journal with 1500-1600 calories and 85-90 grams of protein daily. Wanda Peters has been instructed to work up to a goal of 150 minutes of combined cardio and strengthening exercise per week for weight loss and overall health benefits. We discussed the following Behavioral Modification Strategies today: increasing lean protein intake, decreasing simple carbohydrates, emotional eating strategies, and planning for success. We discussed various medication options to help [facility] with her weight loss efforts and we both agreed to start Saxenda 3.0 mg SQ daily #5 pens and needles with 0 refills. She will take 0.6 mg daily and will follow-up with our clinic in 2-3 weeks.  Wanda Peters has agreed to follow-up with our clinic in 2-3 weeks. She was informed of the importance of frequent follow-up visits to maximize her success with intensive lifestyle modifications for her multiple health conditions.  ALLERGIES: Allergies  Allergen Reactions  . Sulfonamide Derivatives     REACTION: rash    MEDICATIONS: Current Outpatient Medications on File Prior to Visit  Medication Sig Dispense Refill  . fexofenadine (ALLEGRA) 180 MG tablet Take 180 mg by mouth daily.    . fluticasone (FLONASE) 50 MCG/ACT nasal spray Place 1 spray into both nostrils daily.    . polyethylene glycol powder (GLYCOLAX/MIRALAX) powder Take 17 g by mouth daily. 3350 g 0   No current facility-administered medications on file prior to visit.     PAST MEDICAL HISTORY: Past Medical History:  Diagnosis Date  . Dry skin   . Fatigue   . Rupture of ovarian cyst   . Seasonal allergies   . Swelling of extremity   . Trouble in sleeping   . Vitamin D deficiency     PAST SURGICAL HISTORY: History reviewed. No pertinent surgical history.  SOCIAL HISTORY: Social History   Tobacco Use  . Smoking status: Never Smoker  . Smokeless tobacco: Never Used  Substance Use Topics  .  Alcohol use: Yes    Alcohol/week: 0.0 standard drinks    Comment: socially  . Drug use: No    FAMILY HISTORY: Family History  Problem Relation Age of Onset  . Cancer Mother   . Obesity Father    ROS: Review of Systems  Respiratory: Negative for shortness of breath.   Cardiovascular: Positive for leg swelling (peripheral edema). Negative for chest pain.  Endo/Heme/Allergies:       Positive for polyphagia.  Psychiatric/Behavioral: Positive for depression (emotional eating).   PHYSICAL EXAM: Pt in no acute distress  RECENT LABS AND TESTS: BMET    Component Value Date/Time   NA 139 09/30/2018 0852   NA 138 04/19/2018 0834   K 4.0 09/30/2018 0852   CL 108 09/30/2018 0852   CO2 22 09/30/2018 0852   GLUCOSE 106 (H) 09/30/2018 0852   BUN 17 09/30/2018 0852   BUN 18 04/19/2018 0834   CREATININE 0.70 09/30/2018 0852   CREATININE 0.63 12/01/2014 1532   CALCIUM 8.9 09/30/2018 0852   GFRNONAA >60 09/30/2018 0852   GFRAA >60 09/30/2018 0852   Lab Results  Component Value Date   HGBA1C 5.4 09/30/2018   HGBA1C 5.4  04/19/2018   HGBA1C 5.3 12/21/2017   HGBA1C 5.5 08/26/2017   HGBA1C 5.3 05/12/2008   Lab Results  Component Value Date   INSULIN 7.9 04/19/2018   INSULIN 8.6 12/21/2017   INSULIN 10.3 08/26/2017   CBC    Component Value Date/Time   WBC 7.5 04/19/2018 0834   WBC 5.9 07/14/2017 0905   RBC 4.38 04/19/2018 0834   RBC 4.19 07/14/2017 0905   HGB 13.8 04/19/2018 0834   HCT 41.4 04/19/2018 0834   PLT 262 04/19/2018 0834   MCV 95 04/19/2018 0834   MCH 31.5 04/19/2018 0834   MCH 31.2 12/01/2014 1532   MCHC 33.3 04/19/2018 0834   MCHC 33.8 07/14/2017 0905   RDW 12.2 (L) 04/19/2018 0834   LYMPHSABS 1.9 04/19/2018 0834   MONOABS 0.4 07/14/2017 0905   EOSABS 0.6 (H) 04/19/2018 0834   BASOSABS 0.1 04/19/2018 0834   Iron/TIBC/Ferritin/ %Sat No results found for: IRON, TIBC, FERRITIN, IRONPCTSAT Lipid Panel     Component Value Date/Time   CHOL 179 09/30/2018  0852   CHOL 192 04/19/2018 0834   TRIG 54 09/30/2018 0852   HDL 52 09/30/2018 0852   HDL 52 04/19/2018 0834   CHOLHDL 3.4 09/30/2018 0852   VLDL 11 09/30/2018 0852   LDLCALC 116 (H) 09/30/2018 0852   LDLCALC 121 (H) 04/19/2018 0834   Hepatic Function Panel     Component Value Date/Time   PROT 7.1 09/30/2018 0852   PROT 6.9 04/19/2018 0834   ALBUMIN 4.0 09/30/2018 0852   ALBUMIN 4.2 04/19/2018 0834   AST 20 09/30/2018 0852   ALT 25 09/30/2018 0852   ALKPHOS 78 09/30/2018 0852   BILITOT 0.3 09/30/2018 0852   BILITOT 0.3 04/19/2018 0834   BILIDIR 0.1 05/12/2008 0942      Component Value Date/Time   TSH 4.03 07/14/2017 0905   TSH 1.688 12/01/2014 1532   TSH 1.93 05/12/2008 0942   Results for GUENEVERE, ROORDA (MRN [id number]) as of 11/03/2018 10:24  Ref. Range 09/30/2018 08:52  Vitamin D, 25-Hydroxy Latest Ref Range: 30.0 - 100.0 ng/mL 47.5    I, Michaelene Song, am acting as Location manager for [employer], FNP-C.  I have reviewed the above documentation for accuracy and completeness, and I agree with the above.  - Elier Zellars, FNP-C.

## 2018-11-09 ENCOUNTER — Encounter (INDEPENDENT_AMBULATORY_CARE_PROVIDER_SITE_OTHER): Payer: Self-pay

## 2018-11-10 MED FILL — SAXENDA 18 MG/3 ML PEN: 18 | 30 days supply | Qty: 15 | Fill #0

## 2018-11-13 ENCOUNTER — Encounter (INDEPENDENT_AMBULATORY_CARE_PROVIDER_SITE_OTHER): Payer: Self-pay | Admitting: Family Medicine

## 2018-11-15 ENCOUNTER — Other Ambulatory Visit: Payer: Self-pay

## 2018-11-15 ENCOUNTER — Ambulatory Visit (INDEPENDENT_AMBULATORY_CARE_PROVIDER_SITE_OTHER): Payer: 59 | Admitting: Family Medicine

## 2018-11-15 ENCOUNTER — Encounter (INDEPENDENT_AMBULATORY_CARE_PROVIDER_SITE_OTHER): Payer: Self-pay | Admitting: Family Medicine

## 2018-11-15 DIAGNOSIS — Z6839 Body mass index (BMI) 39.0-39.9, adult: Secondary | ICD-10-CM

## 2018-11-15 DIAGNOSIS — R609 Edema, unspecified: Secondary | ICD-10-CM | POA: Diagnosis not present

## 2018-11-15 DIAGNOSIS — F3289 Other specified depressive episodes: Secondary | ICD-10-CM | POA: Diagnosis not present

## 2018-11-15 DIAGNOSIS — R600 Localized edema: Secondary | ICD-10-CM

## 2018-11-15 MED ORDER — HYDROCHLOROTHIAZIDE 25 MG PO TABS: 25.0000 mg | ORAL_TABLET | Freq: Every day | ORAL | 0 refills | Status: DC

## 2018-11-15 MED FILL — HYDROCHLOROTHIAZIDE 25 MG T: 25 | 90 days supply | Qty: 90 | Fill #0

## 2018-11-15 NOTE — Telephone Encounter (Signed)
Please advise 

## 2018-11-17 NOTE — Progress Notes (Signed)
Office: [phone]  /  Fax: [phone] TeleHealth Visit:  [name] has verbally consented to this TeleHealth visit today. The patient is located at work, the provider is located at the [employer] [facility] office. The participants in this visit include the listed provider and patient and any and all parties involved. The visit was conducted today via FaceTime.  HPI:   Chief Complaint: OBESITY Wanda Peters is here to discuss her progress with her obesity treatment plan. She is on the keep a food journal with 1500 to 1600 calories and 85 to 90 grams of protein daily and is following her eating plan approximately 50 % of the time. She states she is exercising 0 minutes 0 times per week. [name] started [name]. She had some nausea which has now resolved. She feels [name] is helping with her appetite. [name] feels she needs to "buckle down" to do better on her plan. She is taking Miralax to avoid constipation from [name]. We were unable to weigh the patient today for this TeleHealth visit. She feels as if she has maintained weight since her last visit. She has lost 24 lbs since starting treatment with [name].  Peripheral Edema Katarzyna has a diagnosis of peripheral edema. She is well controlled with HCTZ.  Depression with emotional eating behaviors [name] struggles with emotional eating and using food for comfort to the extent that it is negatively impacting her health. She often snacks when she is not hungry. [name] sometimes feels she is out of control and then feels guilty that she made poor food choices. Her stress eating has decreased. She had discontinued Bupropion because she was unsure about taking it with Saxenda. She has been working on behavior modification techniques to help reduce her emotional eating and has been somewhat successful. She shows no sign of suicidal or homicidal ideations.  ASSESSMENT AND PLAN:  Peripheral edema - Plan: hydrochlorothiazide  (HYDRODIURIL) 25 MG tablet  Other depression  Class 2 severe obesity with serious comorbidity and body mass index (BMI) of 39.0 to 39.9 in adult, unspecified obesity type ([place])  PLAN:  Peripheral Edema Darcus agrees to continue HCTZ 25 mg daily #90 with no refills and follow up as directed.  Depression with Emotional Eating Behaviors We discussed behavior modification techniques today to help Dontasia deal with her emotional eating and depression. She has agreed to resume Wellbutrin and follow up as directed.  Obesity [name] is currently in the action stage of change. As such, her goal is to continue with weight loss efforts She has agreed to keep a food journal with 1500 to 1600 calories and 85 to 90 grams of protein daily [name] has been instructed to work up to a goal of 150 minutes of combined cardio and strengthening exercise per week for weight loss and overall health benefits. We discussed the following Behavioral Modification Strategies today: planning for success and emotional eating strategies  We discussed various medication options to help [facility] with her weight loss efforts and we both agreed to continue Saxenda, but she may increase to 1.2 mg subQ in 1 week if she has excessive hunger.  [name] has agreed to follow up with our clinic in 2 to 3 weeks. She was informed of the importance of frequent follow up visits to maximize her success with intensive lifestyle modifications for her multiple health conditions.  ALLERGIES: Allergies  Allergen Reactions  . Sulfonamide Derivatives     REACTION: rash    MEDICATIONS: Current Outpatient Medications on File Prior to  Visit  Medication Sig Dispense Refill  . buPROPion (WELLBUTRIN SR) 200 MG 12 hr tablet Take 1 tablet (200 mg total) by mouth daily. 30 tablet 0  . fexofenadine (ALLEGRA) 180 MG tablet Take 180 mg by mouth daily.    . fluticasone (FLONASE) 50 MCG/ACT nasal spray Place 1 spray into both nostrils  daily.    . Insulin Pen Needle (BD PEN NEEDLE NANO 2ND GEN) 32G X 4 MM MISC 1 Package by Does not apply route 2 (two) times daily. 100 each 0  . Liraglutide -Weight Management (SAXENDA) 18 MG/3ML SOPN Inject 3 mg into the skin daily. 5 pen 0  . polyethylene glycol powder (GLYCOLAX/MIRALAX) powder Take 17 g by mouth daily. 3350 g 0   No current facility-administered medications on file prior to visit.     PAST MEDICAL HISTORY: Past Medical History:  Diagnosis Date  . Dry skin   . Fatigue   . Rupture of ovarian cyst   . Seasonal allergies   . Swelling of extremity   . Trouble in sleeping   . Vitamin D deficiency     PAST SURGICAL HISTORY: History reviewed. No pertinent surgical history.  SOCIAL HISTORY: Social History   Tobacco Use  . Smoking status: Never Smoker  . Smokeless tobacco: Never Used  Substance Use Topics  . Alcohol use: Yes    Alcohol/week: 0.0 standard drinks    Comment: socially  . Drug use: No    FAMILY HISTORY: Family History  Problem Relation Age of Onset  . Cancer Mother   . Obesity Father     ROS: Review of Systems  Constitutional: Negative for weight loss.  Musculoskeletal:       Positive for edema  Psychiatric/Behavioral: Positive for depression. Negative for suicidal ideas.    PHYSICAL EXAM: Pt in no acute distress  RECENT LABS AND TESTS: BMET    Component Value Date/Time   NA 139 09/30/2018 0852   NA 138 04/19/2018 0834   K 4.0 09/30/2018 0852   CL 108 09/30/2018 0852   CO2 22 09/30/2018 0852   GLUCOSE 106 (H) 09/30/2018 0852   BUN 17 09/30/2018 0852   BUN 18 04/19/2018 0834   CREATININE 0.70 09/30/2018 0852   CREATININE 0.63 12/01/2014 1532   CALCIUM 8.9 09/30/2018 0852   GFRNONAA >60 09/30/2018 0852   GFRAA >60 09/30/2018 0852   Lab Results  Component Value Date   HGBA1C 5.4 09/30/2018   HGBA1C 5.4 04/19/2018   HGBA1C 5.3 12/21/2017   HGBA1C 5.5 08/26/2017   HGBA1C 5.3 05/12/2008   Lab Results  Component Value  Date   INSULIN 7.9 04/19/2018   INSULIN 8.6 12/21/2017   INSULIN 10.3 08/26/2017   CBC    Component Value Date/Time   WBC 7.5 04/19/2018 0834   WBC 5.9 07/14/2017 0905   RBC 4.38 04/19/2018 0834   RBC 4.19 07/14/2017 0905   HGB 13.8 04/19/2018 0834   HCT 41.4 04/19/2018 0834   PLT 262 04/19/2018 0834   MCV 95 04/19/2018 0834   MCH 31.5 04/19/2018 0834   MCH 31.2 12/01/2014 1532   MCHC 33.3 04/19/2018 0834   MCHC 33.8 07/14/2017 0905   RDW 12.2 (L) 04/19/2018 0834   LYMPHSABS 1.9 04/19/2018 0834   MONOABS 0.4 07/14/2017 0905   EOSABS 0.6 (H) 04/19/2018 0834   BASOSABS 0.1 04/19/2018 0834   Iron/TIBC/Ferritin/ %Sat No results found for: IRON, TIBC, FERRITIN, IRONPCTSAT Lipid Panel     Component Value Date/Time   CHOL 179  09/30/2018 0852   CHOL 192 04/19/2018 0834   TRIG 54 09/30/2018 0852   HDL 52 09/30/2018 0852   HDL 52 04/19/2018 0834   CHOLHDL 3.4 09/30/2018 0852   VLDL 11 09/30/2018 0852   LDLCALC 116 (H) 09/30/2018 0852   LDLCALC 121 (H) 04/19/2018 0834   Hepatic Function Panel     Component Value Date/Time   PROT 7.1 09/30/2018 0852   PROT 6.9 04/19/2018 0834   ALBUMIN 4.0 09/30/2018 0852   ALBUMIN 4.2 04/19/2018 0834   AST 20 09/30/2018 0852   ALT 25 09/30/2018 0852   ALKPHOS 78 09/30/2018 0852   BILITOT 0.3 09/30/2018 0852   BILITOT 0.3 04/19/2018 0834   BILIDIR 0.1 05/12/2008 0942      Component Value Date/Time   TSH 4.03 07/14/2017 0905   TSH 1.688 12/01/2014 1532   TSH 1.93 05/12/2008 0942     Ref. Range 09/30/2018 08:52  Vitamin D, 25-Hydroxy Latest Ref Range: 30.0 - 100.0 ng/mL 47.5    I, Doreene Nest, am acting as Location manager for [employer], FNP-C.  I have reviewed the above documentation for accuracy and completeness, and I agree with the above.  - Juda Toepfer, FNP-C.

## 2018-11-18 ENCOUNTER — Encounter (INDEPENDENT_AMBULATORY_CARE_PROVIDER_SITE_OTHER): Payer: Self-pay | Admitting: Family Medicine

## 2018-12-06 MED FILL — HYDROCHLOROTHIAZIDE 25 MG T: 25 | 90 days supply | Qty: 90 | Fill #0

## 2018-12-06 MED FILL — BUPROPION HCL SR 200 MG TAB: 200 | 30 days supply | Qty: 30 | Fill #0

## 2018-12-30 ENCOUNTER — Telehealth (INDEPENDENT_AMBULATORY_CARE_PROVIDER_SITE_OTHER): Payer: 59 | Admitting: Family Medicine

## 2019-01-05 ENCOUNTER — Encounter (INDEPENDENT_AMBULATORY_CARE_PROVIDER_SITE_OTHER): Payer: Self-pay | Admitting: Family Medicine

## 2019-01-05 ENCOUNTER — Telehealth (INDEPENDENT_AMBULATORY_CARE_PROVIDER_SITE_OTHER): Payer: 59 | Admitting: Family Medicine

## 2019-01-06 ENCOUNTER — Telehealth (INDEPENDENT_AMBULATORY_CARE_PROVIDER_SITE_OTHER): Payer: 59 | Admitting: Family Medicine

## 2019-01-06 ENCOUNTER — Encounter (INDEPENDENT_AMBULATORY_CARE_PROVIDER_SITE_OTHER): Payer: Self-pay | Admitting: Family Medicine

## 2019-01-06 ENCOUNTER — Other Ambulatory Visit: Payer: Self-pay

## 2019-01-06 DIAGNOSIS — F3289 Other specified depressive episodes: Secondary | ICD-10-CM | POA: Diagnosis not present

## 2019-01-06 DIAGNOSIS — R609 Edema, unspecified: Secondary | ICD-10-CM

## 2019-01-06 DIAGNOSIS — Z6839 Body mass index (BMI) 39.0-39.9, adult: Secondary | ICD-10-CM

## 2019-01-06 MED ORDER — SAXENDA 18 MG/3ML ~~LOC~~ SOPN: 3.0000 mg | PEN_INJECTOR | Freq: Every day | SUBCUTANEOUS | 0 refills | Status: DC

## 2019-01-06 MED ORDER — BUPROPION HCL ER (SR) 200 MG PO TB12: 200.0000 mg | ORAL_TABLET | Freq: Every day | ORAL | 0 refills | Status: DC

## 2019-01-06 MED ORDER — BD PEN NEEDLE NANO 2ND GEN 32G X 4 MM MISC: 1.0000 | Freq: Two times a day (BID) | 0 refills | Status: DC

## 2019-01-06 MED ORDER — HYDROCHLOROTHIAZIDE 25 MG PO TABS: 25.0000 mg | ORAL_TABLET | Freq: Every day | ORAL | 0 refills | Status: DC

## 2019-01-06 MED FILL — UNIFINE PENTIPS 32GX5/32: 32G X 4 MM | 50 days supply | Qty: 100 | Fill #0

## 2019-01-06 MED FILL — BUPROPION HCL SR 200 MG TAB: 200 | 30 days supply | Qty: 30 | Fill #0

## 2019-01-06 NOTE — Telephone Encounter (Signed)
fyi

## 2019-01-07 MED FILL — SAXENDA 18 MG/3 ML PEN: 18 | 30 days supply | Qty: 15 | Fill #0

## 2019-01-10 NOTE — Progress Notes (Signed)
Office: [phone]  /  Fax: [phone] TeleHealth Visit:  Wanda Peters has verbally consented to this TeleHealth visit today. The patient is located at home, the provider is located at the [employer] [facility] office. The participants in this visit include the listed provider and patient. The visit was conducted today via face time.  HPI:   Chief Complaint: OBESITY Wanda Peters is here to discuss her progress with her obesity treatment plan. She is on the keep a food journal with 1500-1600 calories and 85-90 grams of protein daily and is following her eating plan approximately 70 % of the time. She states she is exercising 0 minutes 0 times per week. Wanda Peters states her weight this morning was 249 lbs, which is down a bit. Her hunger has greatly improved on Saxenda. She has been working long hours at [employer] and has been feeling a bit overwhelmed.  We were unable to weigh the patient today for this TeleHealth visit. She feels as if she has lost weight since her last visit. She has lost 24 lbs since starting treatment with [name].  Peripheral Edema Wanda Peters's blood pressure is well controlled. She denies chest pain, headaches, or dizziness. Her edema is controlled on hydrochlorothiazide.  Depression with Emotional Eating Behaviors Wanda Peters states her mood is stable on Wellbutrin. She has been very stressed at work and is working very long hours. She sometimes struggles with sleep but feels that is related to work and not Wellbutrin. Wanda Peters struggles with emotional eating and using food for comfort to the extent that it is negatively impacting her health. She often snacks when she is not hungry. Wanda Peters sometimes feels she is out of control and then feels guilty that she made poor food choices. She has been working on behavior modification techniques to help reduce her emotional eating and has been somewhat successful. She shows no sign of suicidal or homicidal ideations.   Depression screen [facility] 2/9 08/26/2017 06/30/2017  Decreased Interest 1 0  Down, Depressed, Hopeless 1 0  PHQ - 2 Score 2 0  Altered sleeping 2 -  Tired, decreased energy 3 -  Change in appetite 2 -  Feeling bad or failure about yourself  1 -  Trouble concentrating 0 -  Moving slowly or fidgety/restless 0 -  Suicidal thoughts 0 -  PHQ-9 Score 10 -  Difficult doing work/chores Not difficult at all -    ASSESSMENT AND PLAN:  Other depression - with emotional eating - Plan: buPROPion (WELLBUTRIN SR) 200 MG 12 hr tablet  Peripheral edema - Plan: hydrochlorothiazide (HYDRODIURIL) 25 MG tablet  Class 2 severe obesity with serious comorbidity and body mass index (BMI) of 39.0 to 39.9 in adult, unspecified obesity type ([place]) - Plan: Liraglutide -Weight Management (SAXENDA) 18 MG/3ML SOPN, Insulin Pen Needle (BD PEN NEEDLE NANO 2ND GEN) 32G X 4 MM MISC  PLAN:  Peripheral Edema Wanda Peters agrees to continue taking hydrochlorothiazide 25 mg PO daily #30 and we will refill for 1 month. She will continue with diet and exercise. Wanda Peters agrees to follow up with our clinic in 2 weeks.   Depression with Emotional Eating Behaviors We discussed behavior modification techniques today to help Wanda Peters deal with her emotional eating and depression. Wanda Peters agrees to continue taking Wellbutrin SR 200 mg PO daily #30 and we will refill for 1 month. Wanda Peters agrees to follow up with our clinic in 2 weeks.  Obesity Wanda Peters is currently in the action stage of change. As such, her goal is  to continue with weight loss efforts She has agreed to keep a food journal with 1500-1600 calories and 100 grams of protein daily Wanda Peters has been instructed to work up to a goal of 150 minutes of combined cardio and strengthening exercise per week for weight loss and overall health benefits. We discussed the following Behavioral Modification Strategies today: emotional eating strategies and increase H20 intake We  discussed various medication options to help Wanda Peters with her weight loss efforts and we both agreed to continue Saxenda 3 mg SubQ daily #5 pens and we will refill for 1 month, and refill nano pen needles #100 with no refills.  [name] has agreed to follow up with our clinic in 2 weeks. She was informed of the importance of frequent follow up visits to maximize her success with intensive lifestyle modifications for her multiple health conditions.  ALLERGIES: Allergies  Allergen Reactions  . Sulfonamide Derivatives     REACTION: rash    MEDICATIONS: Current Outpatient Medications on File Prior to Visit  Medication Sig Dispense Refill  . fexofenadine (ALLEGRA) 180 MG tablet Take 180 mg by mouth daily.    . fluticasone (FLONASE) 50 MCG/ACT nasal spray Place 1 spray into both nostrils daily.    . polyethylene glycol powder (GLYCOLAX/MIRALAX) powder Take 17 g by mouth daily. 3350 g 0   No current facility-administered medications on file prior to visit.     PAST MEDICAL HISTORY: Past Medical History:  Diagnosis Date  . Dry skin   . Fatigue   . Rupture of ovarian cyst   . Seasonal allergies   . Swelling of extremity   . Trouble in sleeping   . Vitamin D deficiency     PAST SURGICAL HISTORY: History reviewed. No pertinent surgical history.  SOCIAL HISTORY: Social History   Tobacco Use  . Smoking status: Never Smoker  . Smokeless tobacco: Never Used  Substance Use Topics  . Alcohol use: Yes    Alcohol/week: 0.0 standard drinks    Comment: socially  . Drug use: No    FAMILY HISTORY: Family History  Problem Relation Age of Onset  . Cancer Mother   . Obesity Father     ROS: Review of Systems  Constitutional: Positive for weight loss.  Cardiovascular: Negative for chest pain.  Neurological: Negative for dizziness and headaches.  Psychiatric/Behavioral: Positive for depression. Negative for suicidal ideas.    PHYSICAL EXAM: Pt in no acute distress  RECENT  LABS AND TESTS: BMET    Component Value Date/Time   NA 139 09/30/2018 0852   NA 138 04/19/2018 0834   K 4.0 09/30/2018 0852   CL 108 09/30/2018 0852   CO2 22 09/30/2018 0852   GLUCOSE 106 (H) 09/30/2018 0852   BUN 17 09/30/2018 0852   BUN 18 04/19/2018 0834   CREATININE 0.70 09/30/2018 0852   CREATININE 0.63 12/01/2014 1532   CALCIUM 8.9 09/30/2018 0852   GFRNONAA >60 09/30/2018 0852   GFRAA >60 09/30/2018 0852   Lab Results  Component Value Date   HGBA1C 5.4 09/30/2018   HGBA1C 5.4 04/19/2018   HGBA1C 5.3 12/21/2017   HGBA1C 5.5 08/26/2017   HGBA1C 5.3 05/12/2008   Lab Results  Component Value Date   INSULIN 7.9 04/19/2018   INSULIN 8.6 12/21/2017   INSULIN 10.3 08/26/2017   CBC    Component Value Date/Time   WBC 7.5 04/19/2018 0834   WBC 5.9 07/14/2017 0905   RBC 4.38 04/19/2018 0834   RBC 4.19 07/14/2017 0905  HGB 13.8 04/19/2018 0834   HCT 41.4 04/19/2018 0834   PLT 262 04/19/2018 0834   MCV 95 04/19/2018 0834   MCH 31.5 04/19/2018 0834   MCH 31.2 12/01/2014 1532   MCHC 33.3 04/19/2018 0834   MCHC 33.8 07/14/2017 0905   RDW 12.2 (L) 04/19/2018 0834   LYMPHSABS 1.9 04/19/2018 0834   MONOABS 0.4 07/14/2017 0905   EOSABS 0.6 (H) 04/19/2018 0834   BASOSABS 0.1 04/19/2018 0834   Iron/TIBC/Ferritin/ %Sat No results found for: IRON, TIBC, FERRITIN, IRONPCTSAT Lipid Panel     Component Value Date/Time   CHOL 179 09/30/2018 0852   CHOL 192 04/19/2018 0834   TRIG 54 09/30/2018 0852   HDL 52 09/30/2018 0852   HDL 52 04/19/2018 0834   CHOLHDL 3.4 09/30/2018 0852   VLDL 11 09/30/2018 0852   LDLCALC 116 (H) 09/30/2018 0852   LDLCALC 121 (H) 04/19/2018 0834   Hepatic Function Panel     Component Value Date/Time   PROT 7.1 09/30/2018 0852   PROT 6.9 04/19/2018 0834   ALBUMIN 4.0 09/30/2018 0852   ALBUMIN 4.2 04/19/2018 0834   AST 20 09/30/2018 0852   ALT 25 09/30/2018 0852   ALKPHOS 78 09/30/2018 0852   BILITOT 0.3 09/30/2018 0852   BILITOT 0.3  04/19/2018 0834   BILIDIR 0.1 05/12/2008 0942      Component Value Date/Time   TSH 4.03 07/14/2017 0905   TSH 1.688 12/01/2014 1532   TSH 1.93 05/12/2008 0942      I, Trixie Dredge, am acting as transcriptionist for Dennard Nip, MD I have reviewed the above documentation for accuracy and completeness, and I agree with the above. -Dennard Nip, MD

## 2019-01-20 ENCOUNTER — Encounter (INDEPENDENT_AMBULATORY_CARE_PROVIDER_SITE_OTHER): Payer: Self-pay | Admitting: Family Medicine

## 2019-01-20 ENCOUNTER — Other Ambulatory Visit: Payer: Self-pay

## 2019-01-20 ENCOUNTER — Telehealth (INDEPENDENT_AMBULATORY_CARE_PROVIDER_SITE_OTHER): Payer: 59 | Admitting: Family Medicine

## 2019-01-20 DIAGNOSIS — E8881 Metabolic syndrome: Secondary | ICD-10-CM | POA: Diagnosis not present

## 2019-01-20 DIAGNOSIS — Z6839 Body mass index (BMI) 39.0-39.9, adult: Secondary | ICD-10-CM | POA: Diagnosis not present

## 2019-01-27 NOTE — Progress Notes (Signed)
Office: [phone]  /  Fax: [phone] TeleHealth Visit:  Wanda Peters has verbally consented to this TeleHealth visit today. The patient is located at home, the provider is located at the [employer] [facility] office. The participants in this visit include the listed provider and patient. [name] was unable to use realtime audiovisual technology today and the telehealth visit was conducted via telephone.   HPI:   Chief Complaint: OBESITY Wanda Peters is here to discuss her progress with her obesity treatment plan. She is on the keep a food journal with 1500-1600 calories and 100 grams of protein daily and is following her eating plan approximately 70 % of the time. She states she is exercising 0 minutes 0 times per week. Chavie continues to do well with weight loss and she thinks she has lost another lb. She is tolerating her Saxenda at 1.2 mg and notes her hunger is controlled.  We were unable to weigh the patient today for this TeleHealth visit. She feels as if she has lost 1 lb since her last visit. She has lost 24 lbs since starting treatment with [name].  Insulin Resistance Wanda Peters has a diagnosis of insulin resistance based on her elevated fasting insulin level >5. Although Wanda Peters's blood glucose readings are still under good control, insulin resistance puts her at greater risk of metabolic syndrome and diabetes. She continue to do well with diet and last A1c was well controlled at 5.4. She denies nausea, vomiting, or hypoglycemia. She increased Saxenda to 1.2 mg and notes decreased polyphagia.   ASSESSMENT AND PLAN:  Insulin resistance  Class 2 severe obesity with serious comorbidity and body mass index (BMI) of 39.0 to 39.9 in adult, unspecified obesity type ([place])  PLAN:  Insulin Resistance [name] will continue to work on weight loss, exercise, and decreasing simple carbohydrates in her diet to help decrease the risk of diabetes. We dicussed metformin  including benefits and risks. She was informed that eating too many simple carbohydrates or too many calories at one sitting increases the likelihood of GI side effects. Wanda Peters agrees to continue Saxenda at 1.2 mg and will continue to monitor. Wanda Peters agrees to follow up with our clinic in 3 weeks.   I spent > than 50% of the 15 minute visit on counseling as documented in the note.  Obesity Wanda Peters is currently in the action stage of change. As such, her goal is to continue with weight loss efforts She has agreed to keep a food journal with 1500 calories and 100 grams of protein daily Wanda Peters has been instructed to work up to a goal of 150 minutes of combined cardio and strengthening exercise per week for weight loss and overall health benefits. We discussed the following Behavioral Modification Strategies today: increasing lean protein intake and work on meal planning and easy cooking plans   Wanda Peters has agreed to follow up with our clinic in 3 weeks. She was informed of the importance of frequent follow up visits to maximize her success with intensive lifestyle modifications for her multiple health conditions.  ALLERGIES: Allergies  Allergen Reactions  . Sulfonamide Derivatives     REACTION: rash    MEDICATIONS: Current Outpatient Medications on File Prior to Visit  Medication Sig Dispense Refill  . buPROPion (WELLBUTRIN SR) 200 MG 12 hr tablet Take 1 tablet (200 mg total) by mouth daily. 30 tablet 0  . fexofenadine (ALLEGRA) 180 MG tablet Take 180 mg by mouth daily.    . fluticasone (FLONASE) 50 MCG/ACT nasal  spray Place 1 spray into both nostrils daily.    . hydrochlorothiazide (HYDRODIURIL) 25 MG tablet Take 1 tablet (25 mg total) by mouth daily. 90 tablet 0  . Insulin Pen Needle (BD PEN NEEDLE NANO 2ND GEN) 32G X 4 MM MISC 1 Package by Does not apply route 2 (two) times daily. 100 each 0  . Liraglutide -Weight Management (SAXENDA) 18 MG/3ML SOPN Inject 3 mg into the skin  daily. 5 pen 0   No current facility-administered medications on file prior to visit.     PAST MEDICAL HISTORY: Past Medical History:  Diagnosis Date  . Dry skin   . Fatigue   . Rupture of ovarian cyst   . Seasonal allergies   . Swelling of extremity   . Trouble in sleeping   . Vitamin D deficiency     PAST SURGICAL HISTORY: History reviewed. No pertinent surgical history.  SOCIAL HISTORY: Social History   Tobacco Use  . Smoking status: Never Smoker  . Smokeless tobacco: Never Used  Substance Use Topics  . Alcohol use: Yes    Alcohol/week: 0.0 standard drinks    Comment: socially  . Drug use: No    FAMILY HISTORY: Family History  Problem Relation Age of Onset  . Cancer Mother   . Obesity Father     ROS: Review of Systems  Constitutional: Positive for weight loss.  Gastrointestinal: Negative for nausea and vomiting.  Endo/Heme/Allergies:       Negative hypoglycemia Negative polyphagia    PHYSICAL EXAM: Pt in no acute distress  RECENT LABS AND TESTS: BMET    Component Value Date/Time   NA 139 09/30/2018 0852   NA 138 04/19/2018 0834   K 4.0 09/30/2018 0852   CL 108 09/30/2018 0852   CO2 22 09/30/2018 0852   GLUCOSE 106 (H) 09/30/2018 0852   BUN 17 09/30/2018 0852   BUN 18 04/19/2018 0834   CREATININE 0.70 09/30/2018 0852   CREATININE 0.63 12/01/2014 1532   CALCIUM 8.9 09/30/2018 0852   GFRNONAA >60 09/30/2018 0852   GFRAA >60 09/30/2018 0852   Lab Results  Component Value Date   HGBA1C 5.4 09/30/2018   HGBA1C 5.4 04/19/2018   HGBA1C 5.3 12/21/2017   HGBA1C 5.5 08/26/2017   HGBA1C 5.3 05/12/2008   Lab Results  Component Value Date   INSULIN 7.9 04/19/2018   INSULIN 8.6 12/21/2017   INSULIN 10.3 08/26/2017   CBC    Component Value Date/Time   WBC 7.5 04/19/2018 0834   WBC 5.9 07/14/2017 0905   RBC 4.38 04/19/2018 0834   RBC 4.19 07/14/2017 0905   HGB 13.8 04/19/2018 0834   HCT 41.4 04/19/2018 0834   PLT 262 04/19/2018 0834    MCV 95 04/19/2018 0834   MCH 31.5 04/19/2018 0834   MCH 31.2 12/01/2014 1532   MCHC 33.3 04/19/2018 0834   MCHC 33.8 07/14/2017 0905   RDW 12.2 (L) 04/19/2018 0834   LYMPHSABS 1.9 04/19/2018 0834   MONOABS 0.4 07/14/2017 0905   EOSABS 0.6 (H) 04/19/2018 0834   BASOSABS 0.1 04/19/2018 0834   Iron/TIBC/Ferritin/ %Sat No results found for: IRON, TIBC, FERRITIN, IRONPCTSAT Lipid Panel     Component Value Date/Time   CHOL 179 09/30/2018 0852   CHOL 192 04/19/2018 0834   TRIG 54 09/30/2018 0852   HDL 52 09/30/2018 0852   HDL 52 04/19/2018 0834   CHOLHDL 3.4 09/30/2018 0852   VLDL 11 09/30/2018 0852   LDLCALC 116 (H) 09/30/2018 0852   LDLCALC  121 (H) 04/19/2018 0834   Hepatic Function Panel     Component Value Date/Time   PROT 7.1 09/30/2018 0852   PROT 6.9 04/19/2018 0834   ALBUMIN 4.0 09/30/2018 0852   ALBUMIN 4.2 04/19/2018 0834   AST 20 09/30/2018 0852   ALT 25 09/30/2018 0852   ALKPHOS 78 09/30/2018 0852   BILITOT 0.3 09/30/2018 0852   BILITOT 0.3 04/19/2018 0834   BILIDIR 0.1 05/12/2008 0942      Component Value Date/Time   TSH 4.03 07/14/2017 0905   TSH 1.688 12/01/2014 1532   TSH 1.93 05/12/2008 0942      I, Trixie Dredge, am acting as transcriptionist for Dennard Nip, MD I have reviewed the above documentation for accuracy and completeness, and I agree with the above. -Dennard Nip, MD

## 2019-02-08 DIAGNOSIS — Z01419 Encounter for gynecological examination (general) (routine) without abnormal findings: Secondary | ICD-10-CM | POA: Diagnosis not present

## 2019-02-08 DIAGNOSIS — Z6841 Body Mass Index (BMI) 40.0 and over, adult: Secondary | ICD-10-CM | POA: Diagnosis not present

## 2019-02-09 ENCOUNTER — Other Ambulatory Visit: Payer: Self-pay

## 2019-02-09 ENCOUNTER — Telehealth (INDEPENDENT_AMBULATORY_CARE_PROVIDER_SITE_OTHER): Payer: 59 | Admitting: Family Medicine

## 2019-02-09 DIAGNOSIS — F3289 Other specified depressive episodes: Secondary | ICD-10-CM

## 2019-02-09 DIAGNOSIS — R609 Edema, unspecified: Secondary | ICD-10-CM | POA: Diagnosis not present

## 2019-02-09 DIAGNOSIS — Z6839 Body mass index (BMI) 39.0-39.9, adult: Secondary | ICD-10-CM

## 2019-02-09 MED ORDER — SAXENDA 18 MG/3ML ~~LOC~~ SOPN: 3.0000 mg | PEN_INJECTOR | Freq: Every day | SUBCUTANEOUS | 0 refills | Status: DC

## 2019-02-09 MED ORDER — BUPROPION HCL ER (SR) 200 MG PO TB12: 200.0000 mg | ORAL_TABLET | Freq: Every day | ORAL | 0 refills | Status: DC

## 2019-02-09 MED ORDER — HYDROCHLOROTHIAZIDE 25 MG PO TABS: 25.0000 mg | ORAL_TABLET | Freq: Every day | ORAL | 0 refills | Status: DC

## 2019-02-11 DIAGNOSIS — R1032 Left lower quadrant pain: Secondary | ICD-10-CM | POA: Diagnosis not present

## 2019-02-15 MED FILL — BUPROPION HCL SR 200 MG TAB: 200 | 30 days supply | Qty: 30 | Fill #0 | Status: TO

## 2019-02-15 NOTE — Progress Notes (Signed)
Office: [phone]  /  Fax: [phone] TeleHealth Visit:  Wanda Peters has verbally consented to this TeleHealth visit today. The patient is located at home, the provider is located at the [employer] [facility] office. The participants in this visit include the listed provider and patient. Wanda Peters was unable to use realtime audiovisual technology today and the telehealth visit was conducted via telephone.   HPI:   Chief Complaint: OBESITY Wanda Peters is here to discuss her progress with her obesity treatment plan. She is on the keep a food journal with 1500 calories and 100 grams of protein daily and is following her eating plan approximately 75 % of the time. She states she is exercising 0 minutes 0 times per week. Wanda Peters continues to do well maintaining her weight. She is doing [employer] now about 1/2 the evenings, and likes the variety. She is working on meeting her macro and calorie goals.  We were unable to weigh the patient today for this TeleHealth visit. She feels as if she has maintained her weight since her last visit. She has lost 24 lbs since starting treatment with [name].  Hypertension Wanda Peters is a 43 y.o. female with hypertension. Wanda Peters is not checking her blood pressure at home, but it has been well controlled on hydrochlorothiazide. She denies chest pain, headaches, or dizziness. She is working on weight loss to help control her blood pressure with the goal of decreasing her risk of heart attack and stroke.   Depression with Emotional Eating Behaviors Wanda Peters feels she is able to control her stress eating better, and states her mood is stable on Wellbutrin. She denies insomnia. Wanda Peters struggles with emotional eating and using food for comfort to the extent that it is negatively impacting her health. She often snacks when she is not hungry. Wanda Peters sometimes feels she is out of control and then feels guilty that she made poor food  choices. She has been working on behavior modification techniques to help reduce her emotional eating and has been somewhat successful. She shows no sign of suicidal or homicidal ideations.  Depression screen [facility] 2/9 08/26/2017 06/30/2017  Decreased Interest 1 0  Down, Depressed, Hopeless 1 0  PHQ - 2 Score 2 0  Altered sleeping 2 -  Tired, decreased energy 3 -  Change in appetite 2 -  Feeling bad or failure about yourself  1 -  Trouble concentrating 0 -  Moving slowly or fidgety/restless 0 -  Suicidal thoughts 0 -  PHQ-9 Score 10 -  Difficult doing work/chores Not difficult at all -    ASSESSMENT AND PLAN:  Other depression - with emotional eating - Plan: buPROPion (WELLBUTRIN SR) 200 MG 12 hr tablet  Class 2 severe obesity with serious comorbidity and body mass index (BMI) of 39.0 to 39.9 in adult, unspecified obesity type ([place]) - Plan: Liraglutide -Weight Management (SAXENDA) 18 MG/3ML SOPN  Peripheral edema - Plan: hydrochlorothiazide (HYDRODIURIL) 25 MG tablet  PLAN:  Hypertension We discussed sodium restriction, working on healthy weight loss, and a regular exercise program as the means to achieve improved blood pressure control. Wanda Peters agreed with this plan and agreed to follow up as directed. We will continue to monitor her blood pressure as well as her progress with the above lifestyle modifications. Wanda Peters agrees to continue taking hydrochlorothiazide 25 mg q daily #30 and we will refill for 1 month. She will watch for signs of hypotension as she continues her lifestyle modifications. Wanda Peters agrees to follow up  with our clinic in 3 weeks.  Depression with Emotional Eating Behaviors We discussed behavior modification techniques today to help Wanda Peters deal with her emotional eating and depression. Wanda Peters agrees to continue taking Wellbutrin SR 200 mg q daily #30 and we will refill for 1 month. Wanda Peters agrees to follow up with our clinic in 3 weeks.  Obesity  Wanda Peters is currently in the action stage of change. As such, her goal is to maintain weight for now She has agreed to keep a food journal with 1500 calories and 100 grams of protein daily Wanda Peters has been instructed to work up to a goal of 150 minutes of combined cardio and strengthening exercise per week for weight loss and overall health benefits. We discussed the following Behavioral Modification Strategies today: work on meal planning and easy cooking plans, dealing with family or coworker sabotage and emotional eating strategies We discussed various medication options to help Wanda Peters with her weight loss efforts and we both agreed to continue Saxenda 3 mg SubQ daily #5 pens and we will refill for 1 month. She is to continue to work on maintaining her weight loss.  Wanda Peters has agreed to follow up with our clinic in 3 weeks. She was informed of the importance of frequent follow up visits to maximize her success with intensive lifestyle modifications for her multiple health conditions.  ALLERGIES: Allergies  Allergen Reactions  . Sulfonamide Derivatives     REACTION: rash    MEDICATIONS: Current Outpatient Medications on File Prior to Visit  Medication Sig Dispense Refill  . fexofenadine (ALLEGRA) 180 MG tablet Take 180 mg by mouth daily.    . fluticasone (FLONASE) 50 MCG/ACT nasal spray Place 1 spray into both nostrils daily.    . Insulin Pen Needle (BD PEN NEEDLE NANO 2ND GEN) 32G X 4 MM MISC 1 Package by Does not apply route 2 (two) times daily. 100 each 0   No current facility-administered medications on file prior to visit.     PAST MEDICAL HISTORY: Past Medical History:  Diagnosis Date  . Dry skin   . Fatigue   . Rupture of ovarian cyst   . Seasonal allergies   . Swelling of extremity   . Trouble in sleeping   . Vitamin D deficiency     PAST SURGICAL HISTORY: No past surgical history on file.  SOCIAL HISTORY: Social History   Tobacco Use  . Smoking  status: Never Smoker  . Smokeless tobacco: Never Used  Substance Use Topics  . Alcohol use: Yes    Alcohol/week: 0.0 standard drinks    Comment: socially  . Drug use: No    FAMILY HISTORY: Family History  Problem Relation Age of Onset  . Cancer Mother   . Obesity Father     ROS: Review of Systems  Constitutional: Negative for weight loss.  Cardiovascular: Negative for chest pain.  Neurological: Negative for dizziness and headaches.  Psychiatric/Behavioral: Positive for depression. Negative for suicidal ideas. The patient does not have insomnia.     PHYSICAL EXAM: Pt in no acute distress  RECENT LABS AND TESTS: BMET    Component Value Date/Time   NA 139 09/30/2018 0852   NA 138 04/19/2018 0834   K 4.0 09/30/2018 0852   CL 108 09/30/2018 0852   CO2 22 09/30/2018 0852   GLUCOSE 106 (H) 09/30/2018 0852   BUN 17 09/30/2018 0852   BUN 18 04/19/2018 0834   CREATININE 0.70 09/30/2018 0852   CREATININE 0.63 12/01/2014 1532  CALCIUM 8.9 09/30/2018 0852   GFRNONAA >60 09/30/2018 0852   GFRAA >60 09/30/2018 0852   Lab Results  Component Value Date   HGBA1C 5.4 09/30/2018   HGBA1C 5.4 04/19/2018   HGBA1C 5.3 12/21/2017   HGBA1C 5.5 08/26/2017   HGBA1C 5.3 05/12/2008   Lab Results  Component Value Date   INSULIN 7.9 04/19/2018   INSULIN 8.6 12/21/2017   INSULIN 10.3 08/26/2017   CBC    Component Value Date/Time   WBC 7.5 04/19/2018 0834   WBC 5.9 07/14/2017 0905   RBC 4.38 04/19/2018 0834   RBC 4.19 07/14/2017 0905   HGB 13.8 04/19/2018 0834   HCT 41.4 04/19/2018 0834   PLT 262 04/19/2018 0834   MCV 95 04/19/2018 0834   MCH 31.5 04/19/2018 0834   MCH 31.2 12/01/2014 1532   MCHC 33.3 04/19/2018 0834   MCHC 33.8 07/14/2017 0905   RDW 12.2 (L) 04/19/2018 0834   LYMPHSABS 1.9 04/19/2018 0834   MONOABS 0.4 07/14/2017 0905   EOSABS 0.6 (H) 04/19/2018 0834   BASOSABS 0.1 04/19/2018 0834   Iron/TIBC/Ferritin/ %Sat No results found for: IRON, TIBC, FERRITIN,  IRONPCTSAT Lipid Panel     Component Value Date/Time   CHOL 179 09/30/2018 0852   CHOL 192 04/19/2018 0834   TRIG 54 09/30/2018 0852   HDL 52 09/30/2018 0852   HDL 52 04/19/2018 0834   CHOLHDL 3.4 09/30/2018 0852   VLDL 11 09/30/2018 0852   LDLCALC 116 (H) 09/30/2018 0852   LDLCALC 121 (H) 04/19/2018 0834   Hepatic Function Panel     Component Value Date/Time   PROT 7.1 09/30/2018 0852   PROT 6.9 04/19/2018 0834   ALBUMIN 4.0 09/30/2018 0852   ALBUMIN 4.2 04/19/2018 0834   AST 20 09/30/2018 0852   ALT 25 09/30/2018 0852   ALKPHOS 78 09/30/2018 0852   BILITOT 0.3 09/30/2018 0852   BILITOT 0.3 04/19/2018 0834   BILIDIR 0.1 05/12/2008 0942      Component Value Date/Time   TSH 4.03 07/14/2017 0905   TSH 1.688 12/01/2014 1532   TSH 1.93 05/12/2008 0942      I, Trixie Dredge, am acting as transcriptionist for Dennard Nip, MD I have reviewed the above documentation for accuracy and completeness, and I agree with the above. -Dennard Nip, MD

## 2019-02-21 DIAGNOSIS — Z1231 Encounter for screening mammogram for malignant neoplasm of breast: Secondary | ICD-10-CM | POA: Diagnosis not present

## 2019-02-22 DIAGNOSIS — H524 Presbyopia: Secondary | ICD-10-CM | POA: Diagnosis not present

## 2019-02-22 DIAGNOSIS — H5213 Myopia, bilateral: Secondary | ICD-10-CM | POA: Diagnosis not present

## 2019-02-22 DIAGNOSIS — H52223 Regular astigmatism, bilateral: Secondary | ICD-10-CM | POA: Diagnosis not present

## 2019-02-22 DIAGNOSIS — Z135 Encounter for screening for eye and ear disorders: Secondary | ICD-10-CM | POA: Diagnosis not present

## 2019-03-02 ENCOUNTER — Encounter (INDEPENDENT_AMBULATORY_CARE_PROVIDER_SITE_OTHER): Payer: Self-pay

## 2019-03-02 ENCOUNTER — Telehealth (INDEPENDENT_AMBULATORY_CARE_PROVIDER_SITE_OTHER): Payer: 59 | Admitting: Family Medicine

## 2019-03-02 ENCOUNTER — Encounter (INDEPENDENT_AMBULATORY_CARE_PROVIDER_SITE_OTHER): Payer: Self-pay | Admitting: Family Medicine

## 2019-03-02 ENCOUNTER — Other Ambulatory Visit: Payer: Self-pay

## 2019-03-02 DIAGNOSIS — R7303 Prediabetes: Secondary | ICD-10-CM | POA: Diagnosis not present

## 2019-03-02 DIAGNOSIS — F3289 Other specified depressive episodes: Secondary | ICD-10-CM | POA: Diagnosis not present

## 2019-03-02 DIAGNOSIS — Z6841 Body Mass Index (BMI) 40.0 and over, adult: Secondary | ICD-10-CM

## 2019-03-02 MED ORDER — BUPROPION HCL ER (SR) 150 MG PO TB12: 150.0000 mg | ORAL_TABLET | Freq: Two times a day (BID) | ORAL | 0 refills | Status: DC

## 2019-03-02 MED FILL — BUPROPION HCL SR 150 MG TAB: 150 | 30 days supply | Qty: 60 | Fill #0

## 2019-03-07 MED FILL — HYDROCHLOROTHIAZIDE 25 MG T: 25 | 90 days supply | Qty: 90 | Fill #0

## 2019-03-07 NOTE — Progress Notes (Signed)
Office: [phone]  /  Fax: [phone] TeleHealth Visit:  Wanda Peters has verbally consented to this TeleHealth visit today. The patient is located at home, the provider is located at the [employer] [facility] office. The participants in this visit include the listed provider and patient. The visit was conducted today via telephone call (FaceTime failed - changed to telephone call).  HPI:   Chief Complaint: OBESITY Wanda Peters is here to discuss her progress with her obesity treatment plan. She is keeping a food journal with 1500 calories and 100 grams of protein and is following her eating plan approximately 50% of the time. She states she is exercising 0 minutes 0 times per week. Wanda Peters states she has been struggling a lot more with stress eating over the last 5-6 weeks and has not lost weight. She journals on and off, but states she is ready to get back on track. She thinks she did better on her Category 3 plan.  We were unable to weigh the patient today for this TeleHealth visit. She feels as if she has gained 4 lbs since her last visit. She has lost 24 lbs since starting treatment with [name].  Depression, Other Wanda Peters is struggling with emotional eating and using food for comfort to the extent that it is negatively impacting her health. She often snacks when she is not hungry. Wanda Peters sometimes feels she is out of control and then feels guilty that she made poor food choices. She has been working on behavior modification techniques to help reduce her emotional eating and has been somewhat successful. Wanda Peters notes increased stress and irritability for the last 1-2 months and feels she is not getting as much benefit from her Wellbutrin. She shows no sign of suicidal or homicidal ideations.  Depression screen [facility] 2/9 08/26/2017 06/30/2017  Decreased Interest 1 0  Down, Depressed, Hopeless 1 0  PHQ - 2 Score 2 0  Altered sleeping 2 -  Tired, decreased energy 3 -   Change in appetite 2 -  Feeling bad or failure about yourself  1 -  Trouble concentrating 0 -  Moving slowly or fidgety/restless 0 -  Suicidal thoughts 0 -  PHQ-9 Score 10 -  Difficult doing work/chores Not difficult at all -   Pre-Diabetes Wanda Peters has a diagnosis of prediabetes based on her elevated Hgb A1c and was informed this puts her at greater risk of developing diabetes. Last A1c well controlled at 5.4 on 09/30/2018. She is stable on Saxenda, but has been off track with her eating. She continues to work on diet and exercise to decrease risk of diabetes. She denies nausea or hypoglycemia.  ASSESSMENT AND PLAN:  Prediabetes  Other depression - with emotional eating - Plan: buPROPion (WELLBUTRIN SR) 150 MG 12 hr tablet  Class 3 severe obesity with serious comorbidity and body mass index (BMI) of 40.0 to 44.9 in adult, unspecified obesity type (Wanda Peters)  PLAN:  Depression, Other We discussed behavior modification techniques today to help Wanda Peters deal with her emotional eating and depression. Wanda Peters will increase Wellbutrin SR to 150 mg BID #60 and agrees to follow-up with our clinic in 3 weeks.  Pre-Diabetes Wanda Peters will continue to work on weight loss, exercise, and decreasing simple carbohydrates in her diet to help decrease the risk of diabetes. We dicussed metformin including benefits and risks. She was informed that eating too many simple carbohydrates or too many calories at one sitting increases the likelihood of GI side effects. Wanda Peters will continue diet  and continue Saxenda. We may consider increasing the dose at her next visit.  Obesity Wanda Peters is currently in the action stage of change. As such, her goal is to continue with weight loss efforts. She has agreed to change and will now follow the Category 3 plan. Wanda Peters has been instructed to work up to a goal of 150 minutes of combined cardio and strengthening exercise per week for weight loss and overall  health benefits. We discussed the following Behavioral Modification Strategies today: no skipping meals, work on meal planning and easy cooking plans.  Wanda Peters has agreed to follow-up with our clinic in 3 weeks. She was informed of the importance of frequent follow-up visits to maximize her success with intensive lifestyle modifications for her multiple health conditions.  ALLERGIES: Allergies  Allergen Reactions  . Sulfonamide Derivatives     REACTION: rash    MEDICATIONS: Current Outpatient Medications on File Prior to Visit  Medication Sig Dispense Refill  . fexofenadine (ALLEGRA) 180 MG tablet Take 180 mg by mouth daily.    . fluticasone (FLONASE) 50 MCG/ACT nasal spray Place 1 spray into both nostrils daily.    . hydrochlorothiazide (HYDRODIURIL) 25 MG tablet Take 1 tablet (25 mg total) by mouth daily. 90 tablet 0  . Insulin Pen Needle (BD PEN NEEDLE NANO 2ND GEN) 32G X 4 MM MISC 1 Package by Does not apply route 2 (two) times daily. 100 each 0  . Liraglutide -Weight Management (SAXENDA) 18 MG/3ML SOPN Inject 3 mg into the skin daily. 5 pen 0   No current facility-administered medications on file prior to visit.     PAST MEDICAL HISTORY: Past Medical History:  Diagnosis Date  . Dry skin   . Fatigue   . Rupture of ovarian cyst   . Seasonal allergies   . Swelling of extremity   . Trouble in sleeping   . Vitamin D deficiency     PAST SURGICAL HISTORY: History reviewed. No pertinent surgical history.  SOCIAL HISTORY: Social History   Tobacco Use  . Smoking status: Never Smoker  . Smokeless tobacco: Never Used  Substance Use Topics  . Alcohol use: Yes    Alcohol/week: 0.0 standard drinks    Comment: socially  . Drug use: No    FAMILY HISTORY: Family History  Problem Relation Age of Onset  . Cancer Mother   . Obesity Father    ROS: Review of Systems  Gastrointestinal: Negative for nausea.  Endo/Heme/Allergies:       Negative for hypoglycemia.   Psychiatric/Behavioral: Positive for depression. Negative for suicidal ideas.       Negative for homicidal ideas.   PHYSICAL EXAM: Pt in no acute distress  RECENT LABS AND TESTS: BMET    Component Value Date/Time   NA 139 09/30/2018 0852   NA 138 04/19/2018 0834   K 4.0 09/30/2018 0852   CL 108 09/30/2018 0852   CO2 22 09/30/2018 0852   GLUCOSE 106 (H) 09/30/2018 0852   BUN 17 09/30/2018 0852   BUN 18 04/19/2018 0834   CREATININE 0.70 09/30/2018 0852   CREATININE 0.63 12/01/2014 1532   CALCIUM 8.9 09/30/2018 0852   GFRNONAA >60 09/30/2018 0852   GFRAA >60 09/30/2018 0852   Lab Results  Component Value Date   HGBA1C 5.4 09/30/2018   HGBA1C 5.4 04/19/2018   HGBA1C 5.3 12/21/2017   HGBA1C 5.5 08/26/2017   HGBA1C 5.3 05/12/2008   Lab Results  Component Value Date   INSULIN 7.9 04/19/2018   INSULIN  8.6 12/21/2017   INSULIN 10.3 08/26/2017   CBC    Component Value Date/Time   WBC 7.5 04/19/2018 0834   WBC 5.9 07/14/2017 0905   RBC 4.38 04/19/2018 0834   RBC 4.19 07/14/2017 0905   HGB 13.8 04/19/2018 0834   HCT 41.4 04/19/2018 0834   PLT 262 04/19/2018 0834   MCV 95 04/19/2018 0834   MCH 31.5 04/19/2018 0834   MCH 31.2 12/01/2014 1532   MCHC 33.3 04/19/2018 0834   MCHC 33.8 07/14/2017 0905   RDW 12.2 (L) 04/19/2018 0834   LYMPHSABS 1.9 04/19/2018 0834   MONOABS 0.4 07/14/2017 0905   EOSABS 0.6 (H) 04/19/2018 0834   BASOSABS 0.1 04/19/2018 0834   Iron/TIBC/Ferritin/ %Sat No results found for: IRON, TIBC, FERRITIN, IRONPCTSAT Lipid Panel     Component Value Date/Time   CHOL 179 09/30/2018 0852   CHOL 192 04/19/2018 0834   TRIG 54 09/30/2018 0852   HDL 52 09/30/2018 0852   HDL 52 04/19/2018 0834   CHOLHDL 3.4 09/30/2018 0852   VLDL 11 09/30/2018 0852   LDLCALC 116 (H) 09/30/2018 0852   LDLCALC 121 (H) 04/19/2018 0834   Hepatic Function Panel     Component Value Date/Time   PROT 7.1 09/30/2018 0852   PROT 6.9 04/19/2018 0834   ALBUMIN 4.0  09/30/2018 0852   ALBUMIN 4.2 04/19/2018 0834   AST 20 09/30/2018 0852   ALT 25 09/30/2018 0852   ALKPHOS 78 09/30/2018 0852   BILITOT 0.3 09/30/2018 0852   BILITOT 0.3 04/19/2018 0834   BILIDIR 0.1 05/12/2008 0942      Component Value Date/Time   TSH 4.03 07/14/2017 0905   TSH 1.688 12/01/2014 1532   TSH 1.93 05/12/2008 0942   Results for Wanda Peters, Wanda Peters (MRN [id number]) as of 03/07/2019 07:45  Ref. Range 09/30/2018 08:52  Vitamin D, 25-Hydroxy Latest Ref Range: 30.0 - 100.0 ng/mL 47.5   I, Wanda Peters, am acting as Location manager for Dennard Nip, MD I have reviewed the above documentation for accuracy and completeness, and I agree with the above. -Dennard Nip, MD

## 2019-03-23 ENCOUNTER — Telehealth (INDEPENDENT_AMBULATORY_CARE_PROVIDER_SITE_OTHER): Payer: 59 | Admitting: Family Medicine

## 2019-03-23 ENCOUNTER — Encounter (INDEPENDENT_AMBULATORY_CARE_PROVIDER_SITE_OTHER): Payer: Self-pay | Admitting: Family Medicine

## 2019-03-23 ENCOUNTER — Other Ambulatory Visit: Payer: Self-pay

## 2019-03-23 DIAGNOSIS — F19982 Other psychoactive substance use, unspecified with psychoactive substance-induced sleep disorder: Secondary | ICD-10-CM | POA: Diagnosis not present

## 2019-03-23 DIAGNOSIS — Z6841 Body Mass Index (BMI) 40.0 and over, adult: Secondary | ICD-10-CM

## 2019-03-23 MED FILL — miSOPROStol 200 MCG TABS: 200 | 1 days supply | Qty: 1 | Fill #0

## 2019-03-23 MED FILL — IBUPROFEN 800 MG TAB: 800 | 6 days supply | Qty: 20 | Fill #0

## 2019-03-28 NOTE — Progress Notes (Signed)
Office: [phone]  /  Fax: [phone] TeleHealth Visit:  Wanda Peters has verbally consented to this TeleHealth visit today. The patient is located at home, the provider is located at the [employer] [facility] office. The participants in this visit include the listed provider and patient. [name] was unable to use realtime audiovisual technology today and the telehealth visit was conducted via telephone.   HPI:   Chief Complaint: OBESITY Wanda Peters is here to discuss her progress with her obesity treatment plan. She is on the Category 3 plan and is following her eating plan approximately 50 % of the time. She states she is exercising 0 minutes 0 times per week. Wanda Peters continues to do well with her diet and states she weighs 248 lbs this morning at home. She is doing well taking Saxenda and feels her excessive hunger is better controlled.  We were unable to weigh the patient today for this TeleHealth visit. She feels as if she has maintained her weight since her last visit. She has lost 24 lbs since starting treatment with [name].  Insomnia Wanda Peters complains of insomnia. She notes increased difficulty falling asleep and waking up early with increased dose of Wellbutrin for the last 2-3 weeks. She feels she is getting benefit from the increased dose and doesn't want to change.  ASSESSMENT AND PLAN:  Drug-induced insomnia (HCC)  Class 3 severe obesity with serious comorbidity and body mass index (BMI) of 40.0 to 44.9 in adult, unspecified obesity type ([place])  PLAN:  Insomnia The problem of recurrent insomnia was discussed. Avoidance of caffeine sources was strongly encouraged and sleep hygiene issues were reviewed. Wanda Peters agrees to continue Wellbutrin as is and was informed the insomnia will likely improve over time. We will continue to monitor closely. Wanda Peters agrees to follow up with our clinic in 3 weeks.  I spent > than 50% of the 15 minute visit on counseling  as documented in the note.  Obesity Wanda Peters is currently in the action stage of change. As such, her goal is to continue with weight loss efforts She has agreed to follow the Category 3 plan Wanda Peters has been instructed to work up to a goal of 150 minutes of combined cardio and strengthening exercise per week for weight loss and overall health benefits. We discussed the following Behavioral Modification Strategies today: increasing lean protein intake and decreasing simple carbohydrates  We discussed various medication options to help Wanda Peters with her weight loss efforts and we both agreed to continue Saxenda as is.  Wanda Peters has agreed to follow up with our clinic in 3 weeks. She was informed of the importance of frequent follow up visits to maximize her success with intensive lifestyle modifications for her multiple health conditions.  ALLERGIES: Allergies  Allergen Reactions  . Sulfonamide Derivatives     REACTION: rash    MEDICATIONS: Current Outpatient Medications on File Prior to Visit  Medication Sig Dispense Refill  . buPROPion (WELLBUTRIN SR) 150 MG 12 hr tablet Take 1 tablet (150 mg total) by mouth 2 (two) times daily. 60 tablet 0  . fexofenadine (ALLEGRA) 180 MG tablet Take 180 mg by mouth daily.    . fluticasone (FLONASE) 50 MCG/ACT nasal spray Place 1 spray into both nostrils daily.    . hydrochlorothiazide (HYDRODIURIL) 25 MG tablet Take 1 tablet (25 mg total) by mouth daily. 90 tablet 0  . Insulin Pen Needle (BD PEN NEEDLE NANO 2ND GEN) 32G X 4 MM MISC 1 Package by Does not apply  route 2 (two) times daily. 100 each 0  . Liraglutide -Weight Management (SAXENDA) 18 MG/3ML SOPN Inject 3 mg into the skin daily. 5 pen 0   No current facility-administered medications on file prior to visit.     PAST MEDICAL HISTORY: Past Medical History:  Diagnosis Date  . Dry skin   . Fatigue   . Rupture of ovarian cyst   . Seasonal allergies   . Swelling of extremity   .  Trouble in sleeping   . Vitamin D deficiency     PAST SURGICAL HISTORY: History reviewed. No pertinent surgical history.  SOCIAL HISTORY: Social History   Tobacco Use  . Smoking status: Never Smoker  . Smokeless tobacco: Never Used  Substance Use Topics  . Alcohol use: Yes    Alcohol/week: 0.0 standard drinks    Comment: socially  . Drug use: No    FAMILY HISTORY: Family History  Problem Relation Age of Onset  . Cancer Mother   . Obesity Father     ROS: Review of Systems  Constitutional: Negative for weight loss.  Psychiatric/Behavioral: The patient has insomnia.     PHYSICAL EXAM: Pt in no acute distress  RECENT LABS AND TESTS: BMET    Component Value Date/Time   NA 139 09/30/2018 0852   NA 138 04/19/2018 0834   K 4.0 09/30/2018 0852   CL 108 09/30/2018 0852   CO2 22 09/30/2018 0852   GLUCOSE 106 (H) 09/30/2018 0852   BUN 17 09/30/2018 0852   BUN 18 04/19/2018 0834   CREATININE 0.70 09/30/2018 0852   CREATININE 0.63 12/01/2014 1532   CALCIUM 8.9 09/30/2018 0852   GFRNONAA >60 09/30/2018 0852   GFRAA >60 09/30/2018 0852   Lab Results  Component Value Date   HGBA1C 5.4 09/30/2018   HGBA1C 5.4 04/19/2018   HGBA1C 5.3 12/21/2017   HGBA1C 5.5 08/26/2017   HGBA1C 5.3 05/12/2008   Lab Results  Component Value Date   INSULIN 7.9 04/19/2018   INSULIN 8.6 12/21/2017   INSULIN 10.3 08/26/2017   CBC    Component Value Date/Time   WBC 7.5 04/19/2018 0834   WBC 5.9 07/14/2017 0905   RBC 4.38 04/19/2018 0834   RBC 4.19 07/14/2017 0905   HGB 13.8 04/19/2018 0834   HCT 41.4 04/19/2018 0834   PLT 262 04/19/2018 0834   MCV 95 04/19/2018 0834   MCH 31.5 04/19/2018 0834   MCH 31.2 12/01/2014 1532   MCHC 33.3 04/19/2018 0834   MCHC 33.8 07/14/2017 0905   RDW 12.2 (L) 04/19/2018 0834   LYMPHSABS 1.9 04/19/2018 0834   MONOABS 0.4 07/14/2017 0905   EOSABS 0.6 (H) 04/19/2018 0834   BASOSABS 0.1 04/19/2018 0834   Iron/TIBC/Ferritin/ %Sat No results  found for: IRON, TIBC, FERRITIN, IRONPCTSAT Lipid Panel     Component Value Date/Time   CHOL 179 09/30/2018 0852   CHOL 192 04/19/2018 0834   TRIG 54 09/30/2018 0852   HDL 52 09/30/2018 0852   HDL 52 04/19/2018 0834   CHOLHDL 3.4 09/30/2018 0852   VLDL 11 09/30/2018 0852   LDLCALC 116 (H) 09/30/2018 0852   LDLCALC 121 (H) 04/19/2018 0834   Hepatic Function Panel     Component Value Date/Time   PROT 7.1 09/30/2018 0852   PROT 6.9 04/19/2018 0834   ALBUMIN 4.0 09/30/2018 0852   ALBUMIN 4.2 04/19/2018 0834   AST 20 09/30/2018 0852   ALT 25 09/30/2018 0852   ALKPHOS 78 09/30/2018 0852   BILITOT 0.3 09/30/2018  0852   BILITOT 0.3 04/19/2018 0834   BILIDIR 0.1 05/12/2008 0942      Component Value Date/Time   TSH 4.03 07/14/2017 0905   TSH 1.688 12/01/2014 1532   TSH 1.93 05/12/2008 0942      I, Trixie Dredge, am acting as transcriptionist for Dennard Nip, MD I have reviewed the above documentation for accuracy and completeness, and I agree with the above. -Dennard Nip, MD

## 2019-03-30 DIAGNOSIS — N841 Polyp of cervix uteri: Secondary | ICD-10-CM | POA: Diagnosis not present

## 2019-03-30 DIAGNOSIS — G8918 Other acute postprocedural pain: Secondary | ICD-10-CM | POA: Diagnosis not present

## 2019-03-30 DIAGNOSIS — Z3202 Encounter for pregnancy test, result negative: Secondary | ICD-10-CM | POA: Diagnosis not present

## 2019-03-30 DIAGNOSIS — N840 Polyp of corpus uteri: Secondary | ICD-10-CM | POA: Diagnosis not present

## 2019-03-30 DIAGNOSIS — D259 Leiomyoma of uterus, unspecified: Secondary | ICD-10-CM | POA: Diagnosis not present

## 2019-03-30 DIAGNOSIS — N920 Excessive and frequent menstruation with regular cycle: Secondary | ICD-10-CM | POA: Diagnosis not present

## 2019-03-30 MED FILL — traMADol HCL 50 MG TABS: 50 | 4 days supply | Qty: 15 | Fill #0

## 2019-04-05 DIAGNOSIS — N920 Excessive and frequent menstruation with regular cycle: Secondary | ICD-10-CM | POA: Diagnosis not present

## 2019-04-05 DIAGNOSIS — Z09 Encounter for follow-up examination after completed treatment for conditions other than malignant neoplasm: Secondary | ICD-10-CM | POA: Diagnosis not present

## 2019-04-13 ENCOUNTER — Telehealth (INDEPENDENT_AMBULATORY_CARE_PROVIDER_SITE_OTHER): Payer: 59 | Admitting: Family Medicine

## 2019-04-13 ENCOUNTER — Other Ambulatory Visit: Payer: Self-pay

## 2019-04-13 ENCOUNTER — Encounter (INDEPENDENT_AMBULATORY_CARE_PROVIDER_SITE_OTHER): Payer: Self-pay | Admitting: Family Medicine

## 2019-04-13 DIAGNOSIS — I10 Essential (primary) hypertension: Secondary | ICD-10-CM

## 2019-04-13 DIAGNOSIS — F3289 Other specified depressive episodes: Secondary | ICD-10-CM

## 2019-04-13 DIAGNOSIS — Z6839 Body mass index (BMI) 39.0-39.9, adult: Secondary | ICD-10-CM

## 2019-04-13 MED ORDER — SAXENDA 18 MG/3ML ~~LOC~~ SOPN: 3.0000 mg | PEN_INJECTOR | Freq: Every day | SUBCUTANEOUS | 0 refills | Status: DC

## 2019-04-13 MED ORDER — HYDROCHLOROTHIAZIDE 25 MG PO TABS: 25.0000 mg | ORAL_TABLET | Freq: Every day | ORAL | 0 refills | Status: DC

## 2019-04-13 MED ORDER — BUPROPION HCL ER (SR) 200 MG PO TB12: 200.0000 mg | ORAL_TABLET | Freq: Every day | ORAL | 0 refills | Status: DC

## 2019-04-13 MED ORDER — BD PEN NEEDLE NANO 2ND GEN 32G X 4 MM MISC: 1.0000 | Freq: Two times a day (BID) | 0 refills | Status: DC

## 2019-04-13 MED FILL — BUPROPION HCL SR 200 MG TAB: 200 | 30 days supply | Qty: 30 | Fill #0

## 2019-04-13 MED FILL — UNIFINE PENTIPS 32GX5/32: 32G X 4 MM | 50 days supply | Qty: 100 | Fill #0

## 2019-04-13 NOTE — Progress Notes (Signed)
Office: [phone]  /  Fax: [phone] TeleHealth Visit:  Wanda Peters has verbally consented to this TeleHealth visit today. The patient is located at work, the provider is located at the [employer] [facility] office. The participants in this visit include the listed provider and patient. The visit was conducted today via telephone call (FaceTime failed and changed to telephone call).  HPI:   Chief Complaint: OBESITY Wanda Peters is here to discuss her progress with her obesity treatment plan. She is keeping a food journal with 1500 calories and high protein and is following her eating plan approximately 50% of the time. She states she is exercising 0 minutes 0 times per week. Wanda Peters had surgery 1-2 weeks ago and is recovering, but is feeling well now. She wasn't on her plan for a little bit but is getting back on track. She feels much of her weight gain was due to fluid retention. She would like to increase her dose of Saxenda - is on 1.2 now.  We were unable to weigh the patient today for this TeleHealth visit. She feels as if she has gained 5 lbs since her last visit. She has lost 24 lbs since starting treatment with [name].  Depression, Other Wanda Peters is struggling with emotional eating and using food for comfort to the extent that it is negatively impacting her health. She often snacks when she is not hungry. Wanda Peters sometimes feels she is out of control and then feels guilty that she made poor food choices. She has been working on behavior modification techniques to help reduce her emotional eating and has been somewhat successful. Wanda Peters her dose of Wellbutrin to 150 BID but didn't feel it helped her and maybe Peters her insomnia. She would like to decrease her dose to 200 QAM again. She reports her mood is stable overall. She shows no sign of suicidal or homicidal ideations.  Depression screen [facility] 2/9 08/26/2017 06/30/2017  Decreased Interest 1 0  Down,  Depressed, Hopeless 1 0  PHQ - 2 Score 2 0  Altered sleeping 2 -  Tired, decreased energy 3 -  Change in appetite 2 -  Feeling bad or failure about yourself  1 -  Trouble concentrating 0 -  Moving slowly or fidgety/restless 0 -  Suicidal thoughts 0 -  PHQ-9 Score 10 -  Difficult doing work/chores Not difficult at all -   Hypertension Wanda Peters is a 43 y.o. female with hypertension.  Wanda Peters denies chest pain, headache, or dizziness. She is working weight loss to help control her blood pressure with the goal of decreasing her risk of heart attack and stroke. Wanda Peters's blood pressure is stable on HCTZ.  ASSESSMENT AND PLAN:  Essential hypertension - Plan: hydrochlorothiazide (HYDRODIURIL) 25 MG tablet  Other depression - Plan: buPROPion (WELLBUTRIN SR) 200 MG 12 hr tablet  Class 2 severe obesity with serious comorbidity and body mass index (BMI) of 39.0 to 39.9 in adult, unspecified obesity type ([place]) - Plan: Insulin Pen Needle (BD PEN NEEDLE NANO 2ND GEN) 32G X 4 MM MISC, Liraglutide -Weight Management (SAXENDA) 18 MG/3ML SOPN  PLAN:  Depression, Other We discussed behavior modification techniques today to help Kattia deal with her emotional eating and depression. Macaylee will decrease her Wellbutrin SR to 200 mg QAM and agrees to follow-up with our clinic in 2-3 weeks.  Hypertension We discussed sodium restriction, working on healthy weight loss, and a regular exercise program as the means to achieve improved blood pressure  control. Wanda Peters agreed with this plan and agreed to follow up as directed. We will continue to monitor her blood pressure as well as her progress with the above lifestyle modifications. Wanda Peters was given a refill on her HCTZ 25 mg #90 with 0 refills and agrees to follow-up with our clinic in 2-3 weeks.  She will watch for signs of hypotension as she continues her lifestyle modifications.  Obesity Wanda Peters is currently in the  action stage of change. As such, her goal is to continue with weight loss efforts. She has agreed to keep a food journal with 1500 calories and 80+ grams of protein daily. Wanda Peters has been instructed to work up to a goal of 150 minutes of combined cardio and strengthening exercise per week for weight loss and overall health benefits. We discussed the following Behavioral Modification Strategies today: increasing vegetables, increase H20 intake, and emotional eating strategies.  Wanda Peters was given a refill on her Saxenda for 1 month (patient to increase to 1.8 mg 1 QAM). She was also given a refill on her pen needles.  Wanda Peters has agreed to follow-up with our clinic in 2-3 weeks. She was informed of the importance of frequent follow-up visits to maximize her success with intensive lifestyle modifications for her multiple health conditions.  I spent > than 50% of the 25 minute visit on counseling as documented in the note.   ALLERGIES: Allergies  Allergen Reactions  . Sulfonamide Derivatives     REACTION: rash    MEDICATIONS: Current Outpatient Medications on File Prior to Visit  Medication Sig Dispense Refill  . fexofenadine (ALLEGRA) 180 MG tablet Take 180 mg by mouth daily.    . fluticasone (FLONASE) 50 MCG/ACT nasal spray Place 1 spray into both nostrils daily.    . hydrochlorothiazide (HYDRODIURIL) 25 MG tablet Take 1 tablet (25 mg total) by mouth daily. 90 tablet 0  . Insulin Pen Needle (BD PEN NEEDLE NANO 2ND GEN) 32G X 4 MM MISC 1 Package by Does not apply route 2 (two) times daily. 100 each 0  . Liraglutide -Weight Management (SAXENDA) 18 MG/3ML SOPN Inject 3 mg into the skin daily. 5 pen 0   No current facility-administered medications on file prior to visit.     PAST MEDICAL HISTORY: Past Medical History:  Diagnosis Date  . Dry skin   . Fatigue   . Rupture of ovarian cyst   . Seasonal allergies   . Swelling of extremity   . Trouble in sleeping   . Vitamin D  deficiency     PAST SURGICAL HISTORY: History reviewed. No pertinent surgical history.  SOCIAL HISTORY: Social History   Tobacco Use  . Smoking status: Never Smoker  . Smokeless tobacco: Never Used  Substance Use Topics  . Alcohol use: Yes    Alcohol/week: 0.0 standard drinks    Comment: socially  . Drug use: No    FAMILY HISTORY: Family History  Problem Relation Age of Onset  . Cancer Mother   . Obesity Father    ROS: Review of Systems  Cardiovascular: Negative for chest pain.  Neurological: Negative for dizziness and headaches.  Psychiatric/Behavioral: Positive for depression. Negative for suicidal ideas.       Negative for homicidal ideas.   PHYSICAL EXAM: Pt in no acute distress  RECENT LABS AND TESTS: BMET    Component Value Date/Time   NA 139 09/30/2018 0852   NA 138 04/19/2018 0834   K 4.0 09/30/2018 0852   CL 108 09/30/2018 [id number]  CO2 22 09/30/2018 0852   GLUCOSE 106 (H) 09/30/2018 0852   BUN 17 09/30/2018 0852   BUN 18 04/19/2018 0834   CREATININE 0.70 09/30/2018 0852   CREATININE 0.63 12/01/2014 1532   CALCIUM 8.9 09/30/2018 0852   GFRNONAA >60 09/30/2018 0852   GFRAA >60 09/30/2018 0852   Lab Results  Component Value Date   HGBA1C 5.4 09/30/2018   HGBA1C 5.4 04/19/2018   HGBA1C 5.3 12/21/2017   HGBA1C 5.5 08/26/2017   HGBA1C 5.3 05/12/2008   Lab Results  Component Value Date   INSULIN 7.9 04/19/2018   INSULIN 8.6 12/21/2017   INSULIN 10.3 08/26/2017   CBC    Component Value Date/Time   WBC 7.5 04/19/2018 0834   WBC 5.9 07/14/2017 0905   RBC 4.38 04/19/2018 0834   RBC 4.19 07/14/2017 0905   HGB 13.8 04/19/2018 0834   HCT 41.4 04/19/2018 0834   PLT 262 04/19/2018 0834   MCV 95 04/19/2018 0834   MCH 31.5 04/19/2018 0834   MCH 31.2 12/01/2014 1532   MCHC 33.3 04/19/2018 0834   MCHC 33.8 07/14/2017 0905   RDW 12.2 (L) 04/19/2018 0834   LYMPHSABS 1.9 04/19/2018 0834   MONOABS 0.4 07/14/2017 0905   EOSABS 0.6 (H) 04/19/2018 0834    BASOSABS 0.1 04/19/2018 0834   Iron/TIBC/Ferritin/ %Sat No results found for: IRON, TIBC, FERRITIN, IRONPCTSAT Lipid Panel     Component Value Date/Time   CHOL 179 09/30/2018 0852   CHOL 192 04/19/2018 0834   TRIG 54 09/30/2018 0852   HDL 52 09/30/2018 0852   HDL 52 04/19/2018 0834   CHOLHDL 3.4 09/30/2018 0852   VLDL 11 09/30/2018 0852   LDLCALC 116 (H) 09/30/2018 0852   LDLCALC 121 (H) 04/19/2018 0834   Hepatic Function Panel     Component Value Date/Time   PROT 7.1 09/30/2018 0852   PROT 6.9 04/19/2018 0834   ALBUMIN 4.0 09/30/2018 0852   ALBUMIN 4.2 04/19/2018 0834   AST 20 09/30/2018 0852   ALT 25 09/30/2018 0852   ALKPHOS 78 09/30/2018 0852   BILITOT 0.3 09/30/2018 0852   BILITOT 0.3 04/19/2018 0834   BILIDIR 0.1 05/12/2008 0942      Component Value Date/Time   TSH 4.03 07/14/2017 0905   TSH 1.688 12/01/2014 1532   TSH 1.93 05/12/2008 0942   Results for AUDRA, HERT (MRN [id number]) as of 04/13/2019 12:43  Ref. Range 09/30/2018 08:52  Vitamin D, 25-Hydroxy Latest Ref Range: 30.0 - 100.0 ng/mL 47.5   I, Michaelene Song, am acting as Location manager for Dennard Nip, MD I have reviewed the above documentation for accuracy and completeness, and I agree with the above. -Dennard Nip, MD

## 2019-05-02 ENCOUNTER — Encounter (INDEPENDENT_AMBULATORY_CARE_PROVIDER_SITE_OTHER): Payer: Self-pay | Admitting: Family Medicine

## 2019-05-02 ENCOUNTER — Telehealth (INDEPENDENT_AMBULATORY_CARE_PROVIDER_SITE_OTHER): Payer: 59 | Admitting: Family Medicine

## 2019-05-02 ENCOUNTER — Other Ambulatory Visit: Payer: Self-pay

## 2019-05-02 DIAGNOSIS — F418 Other specified anxiety disorders: Secondary | ICD-10-CM | POA: Diagnosis not present

## 2019-05-02 DIAGNOSIS — Z6839 Body mass index (BMI) 39.0-39.9, adult: Secondary | ICD-10-CM

## 2019-05-02 DIAGNOSIS — Z9189 Other specified personal risk factors, not elsewhere classified: Secondary | ICD-10-CM | POA: Diagnosis not present

## 2019-05-02 DIAGNOSIS — R7303 Prediabetes: Secondary | ICD-10-CM

## 2019-05-02 MED ORDER — BUPROPION HCL ER (SR) 200 MG PO TB12: 200.0000 mg | ORAL_TABLET | Freq: Every day | ORAL | 0 refills | Status: DC

## 2019-05-02 MED ORDER — ESCITALOPRAM OXALATE 10 MG PO TABS: 10.0000 mg | ORAL_TABLET | ORAL | 0 refills | Status: DC

## 2019-05-02 MED FILL — BUPROPION HCL SR 200 MG TAB: 200 | 30 days supply | Qty: 30 | Fill #0

## 2019-05-02 MED FILL — ESCITALOPRAM 10 MG TABLET: 10 | 30 days supply | Qty: 30 | Fill #0

## 2019-05-03 ENCOUNTER — Encounter (INDEPENDENT_AMBULATORY_CARE_PROVIDER_SITE_OTHER): Payer: Self-pay | Admitting: Family Medicine

## 2019-05-04 NOTE — Progress Notes (Signed)
Office: [phone]  /  Fax: [phone] TeleHealth Visit:  Wanda Peters has verbally consented to this TeleHealth visit today. The patient is located at home, the provider is located at the [employer] [facility] office. The participants in this visit include the listed provider and patient. Gwennette was unable to use realtime audiovisual technology today and the telehealth visit was conducted via telephone (doxy failed).   HPI:   Chief Complaint: OBESITY Wanda Peters is here to discuss her progress with her obesity treatment plan. She is on the keep a food journal with 1500 calories and 80+ grams of protein daily and is following her eating plan approximately 50 % of the time. She states she is exercising 0 minutes 0 times per week. Wanda Peters feels she has gained weight since our last visit. She is overwhelmed with work and stress, and has not been able to concentrate on healthy eating or meal planning as much.  We were unable to weigh the patient today for this TeleHealth visit. She feels as if she has gained weight since her last visit. She has lost 24 lbs since starting treatment with [name].  Pre-Diabetes Wanda Peters has a diagnosis of pre-diabetes based on her elevated Hgb A1c and was informed this puts her at greater risk of developing diabetes. She is working on diet and exercise, and she is stable on liraglutide. She denies nausea, vomiting, or hypoglycemia.  Depression with Anxiety Wanda Peters notes increase work anxiety, and she has worked 16 days straight and is feeling very burnt out. She is not sleeping well and her fatigue has increased. She shows no sign of suicidal or homicidal ideations.  ASSESSMENT AND PLAN:  Depression with anxiety - Plan: buPROPion (WELLBUTRIN SR) 200 MG 12 hr tablet, escitalopram (LEXAPRO) 10 MG tablet  Prediabetes  At risk for heart disease  Class 2 severe obesity with serious comorbidity and body mass index (BMI) of 39.0 to 39.9 in adult,  unspecified obesity type ([place])  PLAN:  Pre-Diabetes Wanda Peters will continue to work on weight loss, exercise, and decreasing simple carbohydrates in her diet to help decrease the risk of diabetes. We dicussed metformin including benefits and risks. She was informed that eating too many simple carbohydrates or too many calories at one sitting increases the likelihood of GI side effects. We discussed holiday eating strategies. Wanda Peters agrees to continue liraglutide as is, and she agrees to follow up with our clinic in 2 to 3 weeks as directed to monitor her progress.  Depression with Anxiety We discussed behavior modification techniques today to help Wanda Peters deal with her depression anxiety. Diary agrees to continue taking bupropion 200 mg PO daily #30 and we will refill for 1 month; and she agrees to start Lexapro 10 mg PO q AM #30 with no refills. We will continue to monitor closely. Wanda Peters agrees to follow up with our clinic in 2 to 3 weeks.  I spent > than 50% of the 21 minute visit on counseling as documented in the note.  Obesity Wanda Peters is currently in the action stage of change. As such, her goal is to continue with weight loss efforts She has agreed to keep a food journal with 1500 calories and 100 grams of protein daily Wanda Peters has been instructed to work up to a goal of 150 minutes of combined cardio and strengthening exercise per week for weight loss and overall health benefits. We discussed the following Behavioral Modification Strategies today: holiday eating strategies  and emotional eating strategies   [name]  has agreed to follow up with our clinic in 2 to 3 weeks with [facility], FNP-C. She was informed of the importance of frequent follow up visits to maximize her success with intensive lifestyle modifications for her multiple health conditions.  ALLERGIES: Allergies  Allergen Reactions  . Sulfonamide Derivatives     REACTION: rash    MEDICATIONS:  Current Outpatient Medications on File Prior to Visit  Medication Sig Dispense Refill  . fexofenadine (ALLEGRA) 180 MG tablet Take 180 mg by mouth daily.    . fluticasone (FLONASE) 50 MCG/ACT nasal spray Place 1 spray into both nostrils daily.    . hydrochlorothiazide (HYDRODIURIL) 25 MG tablet Take 1 tablet (25 mg total) by mouth daily. 90 tablet 0  . Insulin Pen Needle (BD PEN NEEDLE NANO 2ND GEN) 32G X 4 MM MISC 1 Package by Does not apply route 2 (two) times daily. 100 each 0  . Liraglutide -Weight Management (SAXENDA) 18 MG/3ML SOPN Inject 3 mg into the skin daily. 5 pen 0   No current facility-administered medications on file prior to visit.     PAST MEDICAL HISTORY: Past Medical History:  Diagnosis Date  . Dry skin   . Fatigue   . Rupture of ovarian cyst   . Seasonal allergies   . Swelling of extremity   . Trouble in sleeping   . Vitamin D deficiency     PAST SURGICAL HISTORY: History reviewed. No pertinent surgical history.  SOCIAL HISTORY: Social History   Tobacco Use  . Smoking status: Never Smoker  . Smokeless tobacco: Never Used  Substance Use Topics  . Alcohol use: Yes    Alcohol/week: 0.0 standard drinks    Comment: socially  . Drug use: No    FAMILY HISTORY: Family History  Problem Relation Age of Onset  . Cancer Mother   . Obesity Father     ROS: Review of Systems  Constitutional: Positive for malaise/fatigue. Negative for weight loss.  Gastrointestinal: Negative for nausea and vomiting.  Endo/Heme/Allergies:       Negative hypoglycemia  Psychiatric/Behavioral: Positive for depression. Negative for suicidal ideas.       + Anxiety    PHYSICAL EXAM: Pt in no acute distress  RECENT LABS AND TESTS: BMET    Component Value Date/Time   NA 139 09/30/2018 0852   NA 138 04/19/2018 0834   K 4.0 09/30/2018 0852   CL 108 09/30/2018 0852   CO2 22 09/30/2018 0852   GLUCOSE 106 (H) 09/30/2018 0852   BUN 17 09/30/2018 0852   BUN 18 04/19/2018  0834   CREATININE 0.70 09/30/2018 0852   CREATININE 0.63 12/01/2014 1532   CALCIUM 8.9 09/30/2018 0852   GFRNONAA >60 09/30/2018 0852   GFRAA >60 09/30/2018 0852   Lab Results  Component Value Date   HGBA1C 5.4 09/30/2018   HGBA1C 5.4 04/19/2018   HGBA1C 5.3 12/21/2017   HGBA1C 5.5 08/26/2017   HGBA1C 5.3 05/12/2008   Lab Results  Component Value Date   INSULIN 7.9 04/19/2018   INSULIN 8.6 12/21/2017   INSULIN 10.3 08/26/2017   CBC    Component Value Date/Time   WBC 7.5 04/19/2018 0834   WBC 5.9 07/14/2017 0905   RBC 4.38 04/19/2018 0834   RBC 4.19 07/14/2017 0905   HGB 13.8 04/19/2018 0834   HCT 41.4 04/19/2018 0834   PLT 262 04/19/2018 0834   MCV 95 04/19/2018 0834   MCH 31.5 04/19/2018 0834   MCH 31.2 12/01/2014 1532   MCHC  33.3 04/19/2018 0834   MCHC 33.8 07/14/2017 0905   RDW 12.2 (L) 04/19/2018 0834   LYMPHSABS 1.9 04/19/2018 0834   MONOABS 0.4 07/14/2017 0905   EOSABS 0.6 (H) 04/19/2018 0834   BASOSABS 0.1 04/19/2018 0834   Iron/TIBC/Ferritin/ %Sat No results found for: IRON, TIBC, FERRITIN, IRONPCTSAT Lipid Panel     Component Value Date/Time   CHOL 179 09/30/2018 0852   CHOL 192 04/19/2018 0834   TRIG 54 09/30/2018 0852   HDL 52 09/30/2018 0852   HDL 52 04/19/2018 0834   CHOLHDL 3.4 09/30/2018 0852   VLDL 11 09/30/2018 0852   LDLCALC 116 (H) 09/30/2018 0852   LDLCALC 121 (H) 04/19/2018 0834   Hepatic Function Panel     Component Value Date/Time   PROT 7.1 09/30/2018 0852   PROT 6.9 04/19/2018 0834   ALBUMIN 4.0 09/30/2018 0852   ALBUMIN 4.2 04/19/2018 0834   AST 20 09/30/2018 0852   ALT 25 09/30/2018 0852   ALKPHOS 78 09/30/2018 0852   BILITOT 0.3 09/30/2018 0852   BILITOT 0.3 04/19/2018 0834   BILIDIR 0.1 05/12/2008 0942      Component Value Date/Time   TSH 4.03 07/14/2017 0905   TSH 1.688 12/01/2014 1532   TSH 1.93 05/12/2008 0942      I, Trixie Dredge, am acting as transcriptionist for Dennard Nip, MD I have reviewed the  above documentation for accuracy and completeness, and I agree with the above. -Dennard Nip, MD

## 2019-05-23 ENCOUNTER — Other Ambulatory Visit: Payer: Self-pay

## 2019-05-23 ENCOUNTER — Encounter (INDEPENDENT_AMBULATORY_CARE_PROVIDER_SITE_OTHER): Payer: Self-pay | Admitting: Family Medicine

## 2019-05-23 ENCOUNTER — Ambulatory Visit (INDEPENDENT_AMBULATORY_CARE_PROVIDER_SITE_OTHER): Payer: 59 | Admitting: Family Medicine

## 2019-05-23 VITALS — BP 106/69 | HR 69 | Temp 98.2°F | Ht 68.0 in | Wt 253.0 lb

## 2019-05-23 DIAGNOSIS — E7849 Other hyperlipidemia: Secondary | ICD-10-CM | POA: Diagnosis not present

## 2019-05-23 DIAGNOSIS — R609 Edema, unspecified: Secondary | ICD-10-CM

## 2019-05-23 DIAGNOSIS — E8881 Metabolic syndrome: Secondary | ICD-10-CM | POA: Diagnosis not present

## 2019-05-23 DIAGNOSIS — Z6838 Body mass index (BMI) 38.0-38.9, adult: Secondary | ICD-10-CM

## 2019-05-23 DIAGNOSIS — Z9189 Other specified personal risk factors, not elsewhere classified: Secondary | ICD-10-CM | POA: Diagnosis not present

## 2019-05-23 DIAGNOSIS — F418 Other specified anxiety disorders: Secondary | ICD-10-CM | POA: Diagnosis not present

## 2019-05-23 DIAGNOSIS — E559 Vitamin D deficiency, unspecified: Secondary | ICD-10-CM | POA: Diagnosis not present

## 2019-05-23 MED ORDER — ESCITALOPRAM OXALATE 10 MG PO TABS: 10.0000 mg | ORAL_TABLET | Freq: Every day | ORAL | 0 refills | Status: DC

## 2019-05-23 MED ORDER — HYDROCHLOROTHIAZIDE 25 MG PO TABS: 25.0000 mg | ORAL_TABLET | Freq: Every day | ORAL | 0 refills | Status: DC

## 2019-05-23 MED ORDER — BUPROPION HCL ER (SR) 200 MG PO TB12: 200.0000 mg | ORAL_TABLET | Freq: Every day | ORAL | 0 refills | Status: DC

## 2019-05-23 MED FILL — HYDROCHLOROTHIAZIDE 25 MG T: 25 | 30 days supply | Qty: 30 | Fill #0

## 2019-05-24 LAB — COMPREHENSIVE METABOLIC PANEL
ALT: 17 IU/L (ref 0–32)
AST: 13 IU/L (ref 0–40)
Albumin/Globulin Ratio: 1.5 (ref 1.2–2.2)
Albumin: 3.9 g/dL (ref 3.8–4.8)
Alkaline Phosphatase: 85 IU/L (ref 39–117)
BUN/Creatinine Ratio: 15 (ref 9–23)
BUN: 14 mg/dL (ref 6–24)
Bilirubin Total: 0.3 mg/dL (ref 0.0–1.2)
CO2: 21 mmol/L (ref 20–29)
Calcium: 8.9 mg/dL (ref 8.7–10.2)
Chloride: 103 mmol/L (ref 96–106)
Creatinine, Ser: 0.92 mg/dL (ref 0.57–1.00)
GFR calc Af Amer: 87 mL/min/{1.73_m2} (ref >59)
GFR calc non Af Amer: 75 mL/min/{1.73_m2} (ref >59)
Globulin, Total: 2.6 g/dL (ref 1.5–4.5)
Glucose: 71 mg/dL (ref 65–99)
Potassium: 4.2 mmol/L (ref 3.5–5.2)
Sodium: 138 mmol/L (ref 134–144)
Total Protein: 6.5 g/dL (ref 6.0–8.5)

## 2019-05-24 LAB — LIPID PANEL WITH LDL/HDL RATIO
Cholesterol, Total: 172 mg/dL (ref 100–199)
HDL: 50 mg/dL (ref >39)
LDL Chol Calc (NIH): 107 mg/dL — ABNORMAL HIGH (ref 0–99)
LDL/HDL Ratio: 2.1 ratio (ref 0.0–3.2)
Triglycerides: 83 mg/dL (ref 0–149)
VLDL Cholesterol Cal: 15 mg/dL (ref 5–40)

## 2019-05-24 LAB — HEMOGLOBIN A1C
Est. average glucose Bld gHb Est-mCnc: 100 mg/dL
Hgb A1c MFr Bld: 5.1 % (ref 4.8–5.6)

## 2019-05-24 LAB — VITAMIN D 25 HYDROXY (VIT D DEFICIENCY, FRACTURES): Vit D, 25-Hydroxy: 36 ng/mL (ref 30.0–100.0)

## 2019-05-24 LAB — INSULIN, RANDOM: INSULIN: 7.6 u[IU]/mL (ref 2.6–24.9)

## 2019-05-24 NOTE — Progress Notes (Signed)
Office: [phone]  /  Fax: [phone]   HPI:  Chief Complaint: OBESITY Wanda Peters is here to discuss her progress with her obesity treatment plan. She is on the keep a food journal with 1500 calories and 100 grams of protein daily and states she is following her eating plan approximately 50 % of the time. She states she is exercising 0 minutes 0 times per week.  Wanda Peters is not journaling and she is skipping breakfast. 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 She is on 1.5 mg of Saxenda daily. She feels it helps with appetite.  Peripheral Edema Wanda Peters has peripheral edema and she is stable with HCTZ. She does notice ankle edema, if she misses HCTZ. She has no history of hypertension.  Vitamin D deficiency Wanda Peters has a diagnosis of vitamin D deficiency. She is stable with supplementation at this point. She admits to mild nausea.  Hyperlipidemia Wanda Peters has hyperlipidemia and she is not on statin. Her LDL was elevated at 116 on the last fasting lipid panel. Triglycerides and HDL are within normal limits.    At risk for cardiovascular disease Wanda Peters is at a higher than average risk for cardiovascular disease due to obesity. She currently denies any chest pain.  Insulin Resistance Wanda Peters has a diagnosis of insulin resistance and she is not on metformin. She has no polyphagia or hypoglycemia. Wanda Peters is taking Saxenda for appetite.  Depression with Anxiety Wanda Peters's stress eating improved over the last three weeks. Lexapro was started at the last visit. Lexapro caused daytime somnolence and she now takes it at night which se is tolerating better. Her mood is stable. Wanda Peters struggles with insomnia and Lexapro was added for this. She reports mild nausea with Lexapro, but she wishes to continue.  Today's visit was # 32 Starting weight: 281 lbs Starting date: 08/26/2017 Today's weight : 253 lbs Today's  date: 05/23/2019 Total lbs lost to date: 28 Total lbs lost since last in-office visit: 4  ASSESSMENT AND PLAN:  Vitamin D deficiency - Plan: Vitamin D (25 hydroxy)  Other hyperlipidemia - Plan: Lipid Panel With LDL/HDL Ratio  At risk for heart disease  Insulin resistance - Plan: Comprehensive Metabolic Panel (CMET), HgB A1c, Insulin, random  Peripheral edema - Plan: hydrochlorothiazide (HYDRODIURIL) 25 MG tablet  Depression with anxiety - Plan: buPROPion (WELLBUTRIN SR) 200 MG 12 hr tablet, escitalopram (LEXAPRO) 10 MG tablet  Class 2 severe obesity with serious comorbidity and body mass index (BMI) of 38.0 to 38.9 in adult, unspecified obesity type ([place])  PLAN:  Peripheral Edema Farha agrees to continue HCTZ 25 mg daily #30 with no refills and follow up as directed. We will check CMET today.  Vitamin D Deficiency Low vitamin D level contributes to fatigue and are associated with obesity, breast, and colon cancer. We will check vitamin D level and she will follow up for routine testing of vitamin D, at least 2-3 times per year to avoid over-replacement.  Hyperlipidemia Intensive lifestyle modifications as the first line treatment for hyperlipidemia. We discussed many lifestyle modifications today and Lamani will continue to work on diet, exercise and weight loss efforts. We will check fasting lipid panel today.  Cardiovascular risk counselling Nithila was given (~15 minutes) coronary artery disease prevention counseling today. She is 43 y.o. female and has risk factors for heart disease including obesity. We discussed intensive lifestyle modifications today with an emphasis on specific weight loss instructions and strategies.  Insulin Resistance Wanda Peters will continue to work on weight loss, exercise, and decreasing simple carbohydrates to help decrease the risk of diabetes. We will check fasting insulin and A1c today. Shardey agreed to follow up with [name] as directed to  closely monitor her progress.  Depression with Anxiety Wanda Peters agrees to continue Lexapro 10 mg daily #30 with no refills and Wellbutrin 200 mg qAM #30 with no refills and follow up as directed. Will continue to follow and monitor her progress.  Obesity Wanda Peters is currently in the action stage of change. As such, her goal is to continue with weight loss efforts She has agreed to follow a lower carbohydrate, vegetable and lean protein rich diet plan We discussed the following Behavioral Modification Strategies today: planning for success, no skipping meals, increasing lean protein intake and decreasing simple carbohydrates  Wanda Peters will continue Saxenda at 1.5 mg daily.  Wanda Peters has agreed to follow up with our clinic in 3 weeks. She was informed of the importance of frequent follow up visits to maximize her success with intensive lifestyle modifications for her multiple health conditions.  ALLERGIES: Allergies  Allergen Reactions  . Sulfonamide Derivatives     REACTION: rash    MEDICATIONS: Current Outpatient Medications on File Prior to Visit  Medication Sig Dispense Refill  . fexofenadine (ALLEGRA) 180 MG tablet Take 180 mg by mouth daily.    . fluticasone (FLONASE) 50 MCG/ACT nasal spray Place 1 spray into both nostrils daily.    . Insulin Pen Needle (BD PEN NEEDLE NANO 2ND GEN) 32G X 4 MM MISC 1 Package by Does not apply route 2 (two) times daily. 100 each 0  . Liraglutide -Weight Management (SAXENDA) 18 MG/3ML SOPN Inject 3 mg into the skin daily. 5 pen 0  . Norethindrone-Ethinyl Estradiol-Fe Biphas (LO LOESTRIN FE) 1 MG-10 MCG / 10 MCG tablet Take 10 mcg by mouth daily.     No current facility-administered medications on file prior to visit.    PAST MEDICAL HISTORY: Past Medical History:  Diagnosis Date  . Dry skin   . Fatigue   . Rupture of ovarian cyst   . Seasonal allergies   . Swelling of extremity   . Trouble in sleeping   . Vitamin D deficiency      PAST SURGICAL HISTORY: History reviewed. No pertinent surgical history.  SOCIAL HISTORY: Social History   Tobacco Use  . Smoking status: Never Smoker  . Smokeless tobacco: Never Used  Substance Use Topics  . Alcohol use: Yes    Alcohol/week: 0.0 standard drinks    Comment: socially  . Drug use: No    FAMILY HISTORY: Family History  Problem Relation Age of Onset  . Cancer Mother   . Obesity Father     ROS: Review of Systems  Constitutional: Positive for weight loss.       Positive for daytime somnolence  Gastrointestinal: Positive for nausea.  Musculoskeletal:       Positive for edema  Endo/Heme/Allergies:       Negative for polyphagia Negative for hypoglycemia  Psychiatric/Behavioral: Positive for depression. The patient is nervous/anxious and has insomnia.     PHYSICAL EXAM: Blood pressure 106/69, pulse 69, temperature 98.2 F (36.8 C), temperature source Oral, height 5\' 8"  (1.727 m), weight 253 lb (114.8 kg), last menstrual period 05/09/2019, SpO2 99 %. Body mass index is 38.47 kg/m. Physical Exam Vitals reviewed.  Constitutional:      General: She is not in acute distress.    Appearance: Normal  appearance. She is well-developed. She is obese.  Cardiovascular:     Rate and Rhythm: Normal rate.  Pulmonary:     Effort: Pulmonary effort is normal.  Musculoskeletal:        General: Normal range of motion.     Right lower leg: Edema present.     Left lower leg: Edema present.  Skin:    General: Skin is warm and dry.  Neurological:     Mental Status: She is alert and oriented to person, place, and time.  Psychiatric:        Mood and Affect: Mood normal.        Behavior: Behavior normal.     RECENT LABS AND TESTS: BMET    Component Value Date/Time   NA 138 05/23/2019 0838   K 4.2 05/23/2019 0838   CL 103 05/23/2019 0838   CO2 21 05/23/2019 0838   GLUCOSE 71 05/23/2019 0838   GLUCOSE 106 (H) 09/30/2018 0852   BUN 14 05/23/2019 0838    CREATININE 0.92 05/23/2019 0838   CREATININE 0.63 12/01/2014 1532   CALCIUM 8.9 05/23/2019 0838   GFRNONAA 75 05/23/2019 0838   GFRAA 87 05/23/2019 0838   Lab Results  Component Value Date   HGBA1C 5.1 05/23/2019   HGBA1C 5.4 09/30/2018   HGBA1C 5.4 04/19/2018   HGBA1C 5.3 12/21/2017   HGBA1C 5.5 08/26/2017   Lab Results  Component Value Date   INSULIN 7.6 05/23/2019   INSULIN 7.9 04/19/2018   INSULIN 8.6 12/21/2017   INSULIN 10.3 08/26/2017   CBC    Component Value Date/Time   WBC 7.5 04/19/2018 0834   WBC 5.9 07/14/2017 0905   RBC 4.38 04/19/2018 0834   RBC 4.19 07/14/2017 0905   HGB 13.8 04/19/2018 0834   HCT 41.4 04/19/2018 0834   PLT 262 04/19/2018 0834   MCV 95 04/19/2018 0834   MCH 31.5 04/19/2018 0834   MCH 31.2 12/01/2014 1532   MCHC 33.3 04/19/2018 0834   MCHC 33.8 07/14/2017 0905   RDW 12.2 (L) 04/19/2018 0834   LYMPHSABS 1.9 04/19/2018 0834   MONOABS 0.4 07/14/2017 0905   EOSABS 0.6 (H) 04/19/2018 0834   BASOSABS 0.1 04/19/2018 0834   Iron/TIBC/Ferritin/ %Sat No results found for: IRON, TIBC, FERRITIN, IRONPCTSAT Lipid Panel     Component Value Date/Time   CHOL 172 05/23/2019 0838   TRIG 83 05/23/2019 0838   HDL 50 05/23/2019 0838   CHOLHDL 3.4 09/30/2018 0852   VLDL 11 09/30/2018 0852   LDLCALC 107 (H) 05/23/2019 0838   Hepatic Function Panel     Component Value Date/Time   PROT 6.5 05/23/2019 0838   ALBUMIN 3.9 05/23/2019 0838   AST 13 05/23/2019 0838   ALT 17 05/23/2019 0838   ALKPHOS 85 05/23/2019 0838   BILITOT 0.3 05/23/2019 0838   BILIDIR 0.1 05/12/2008 0942      Component Value Date/Time   TSH 4.03 07/14/2017 0905   TSH 1.688 12/01/2014 1532   TSH 1.93 05/12/2008 0942     Ref. Range 09/30/2018 08:52  Vitamin D, 25-Hydroxy Latest Ref Range: 30.0 - 100.0 ng/mL 47.5    I, Doreene Nest, am acting as Location manager for [employer], FNP-C  I have reviewed the above documentation for accuracy and completeness, and I agree  with the above.  - Eboni Coval, FNP-C.

## 2019-05-25 ENCOUNTER — Encounter (INDEPENDENT_AMBULATORY_CARE_PROVIDER_SITE_OTHER): Payer: Self-pay | Admitting: Family Medicine

## 2019-05-26 MED FILL — BUPROPION HCL SR 200 MG TAB: 200 | 30 days supply | Qty: 30 | Fill #0

## 2019-05-26 MED FILL — ESCITALOPRAM 10 MG TABLET: 10 | 30 days supply | Qty: 30 | Fill #0

## 2019-06-13 ENCOUNTER — Telehealth (INDEPENDENT_AMBULATORY_CARE_PROVIDER_SITE_OTHER): Payer: 59 | Admitting: Family Medicine

## 2019-06-13 ENCOUNTER — Other Ambulatory Visit: Payer: Self-pay

## 2019-06-13 DIAGNOSIS — R609 Edema, unspecified: Secondary | ICD-10-CM

## 2019-06-13 DIAGNOSIS — R600 Localized edema: Secondary | ICD-10-CM

## 2019-06-13 DIAGNOSIS — Z6838 Body mass index (BMI) 38.0-38.9, adult: Secondary | ICD-10-CM | POA: Diagnosis not present

## 2019-06-13 DIAGNOSIS — F3289 Other specified depressive episodes: Secondary | ICD-10-CM

## 2019-06-13 MED ORDER — HYDROCHLOROTHIAZIDE 25 MG PO TABS: 25.0000 mg | ORAL_TABLET | Freq: Every day | ORAL | 0 refills | Status: DC

## 2019-06-13 MED ORDER — ESCITALOPRAM OXALATE 10 MG PO TABS: 10.0000 mg | ORAL_TABLET | Freq: Every day | ORAL | 0 refills | Status: DC

## 2019-06-13 MED ORDER — BUPROPION HCL ER (SR) 200 MG PO TB12: 200.0000 mg | ORAL_TABLET | Freq: Every day | ORAL | 0 refills | Status: DC

## 2019-06-14 ENCOUNTER — Telehealth (INDEPENDENT_AMBULATORY_CARE_PROVIDER_SITE_OTHER): Payer: 59 | Admitting: Family Medicine

## 2019-06-15 NOTE — Progress Notes (Signed)
Office: [phone]  /  Fax: [phone] TeleHealth Visit:  [name] has verbally consented to this TeleHealth visit today. The patient is located at home, the provider is located at the [facility] office. The participants in this visit include the listed provider and patient and any and all parties involved. The visit was conducted today via telephone (time spent on call 19 minutes, 4:45-5:04).  HPI:  Chief Complaint: OBESITY Elvita is here to discuss her progress with her obesity treatment plan. She is on the lower carbohydrate, vegetable and lean protein rich diet plan and states she is following her eating plan approximately 50 % of the time. She states she is exercising 0 minutes 0 times per week.  Celest has maintained her weight recently (weight not reported). She was off the plan somewhat over Christmas, but she did try to make good choices. She feels that the low carb plan works well for her. She is still on Saxenda 1.5 mg daily which she feels helps with hunger. Breya denies nausea, vomiting or constipation.  Vitamin D deficiency Hermia has a worsening diagnosis of vitamin D deficiency. Her vitamin D level is not at goal. Her last vitamin D level decreased from 47.5 to 36.0 on 05/23/19. Daarina admits to worsening fatigue. She is not on OTC supplementation. Labs were discussed with patient today.  Peripheral Edema Nonda has a diagnosis of peripheral edema. Her peripheral edema is managed well with HCTZ.  Other Depression with emotional eating Janiylah struggles with emotional eating. Her mood is stable. She admits to some food cravings over the holidays. Chantal feels the Lexapro helps with sleep and mood.  ASSESSMENT AND PLAN:  Other depression - with emotional eating - Plan: buPROPion (WELLBUTRIN SR) 200 MG 12 hr tablet, escitalopram (LEXAPRO) 10 MG tablet  Peripheral edema - Plan: hydrochlorothiazide (HYDRODIURIL) 25 MG  tablet  Class 2 severe obesity with serious comorbidity and body mass index (BMI) of 38.0 to 38.9 in adult, unspecified obesity type ([facility])  PLAN:  Vitamin D deficiency Low Vitamin D level contributes to fatigue and are associated with obesity, breast, and colon cancer. Maruska agrees to start OTC vitamin D @2 ,000 IU daily and she will follow-up for routine testing of vitamin D, at least 2-3 times per year to avoid over-replacement. Tifini agrees to follow up with our clinic in 2 weeks.  Peripheral Edema Baylin agrees to continue HCTZ 25 mg daily #30 with no refills and follow up with our clinic in 2 weeks.  Other Depression with emotional eating Chenee agrees to continue Bupropion 200 mg qAM #30 with no refills and Lexapro 10 mg daily #30 with no refills and follow up as directed.  Obesity Damonie is currently in the action stage of change. As such, her goal is to continue with weight loss efforts. She has agreed to lower carbohydrate, vegetable and lean protein rich diet plan.  We discussed the following Behavioral Modification Strategies today: decreasing simple carbohydrates and planning for success. Valissa will continue Saxenda 1.5 mg daily.  Conleigh has agreed to follow-up with our clinic in 2 weeks. She was informed of the importance of frequent follow-up visits to maximize her success with intensive lifestyle modifications for her multiple health conditions.  ALLERGIES: Allergies  Allergen Reactions  . Sulfonamide Derivatives     REACTION: rash    MEDICATIONS: Current Outpatient Medications on File Prior to Visit  Medication Sig Dispense Refill  . fexofenadine (ALLEGRA) 180 MG tablet Take 180 mg by mouth  daily.    . fluticasone (FLONASE) 50 MCG/ACT nasal spray Place 1 spray into both nostrils daily.    . Insulin Pen Needle (BD PEN NEEDLE NANO 2ND GEN) 32G X 4 MM MISC 1 Package by Does not apply route 2 (two) times daily. 100 each 0  . Liraglutide -Weight  Management (SAXENDA) 18 MG/3ML SOPN Inject 3 mg into the skin daily. 5 pen 0  . Norethindrone-Ethinyl Estradiol-Fe Biphas (LO LOESTRIN FE) 1 MG-10 MCG / 10 MCG tablet Take 10 mcg by mouth daily.     No current facility-administered medications on file prior to visit.    PAST MEDICAL HISTORY: Past Medical History:  Diagnosis Date  . Dry skin   . Fatigue   . Rupture of ovarian cyst   . Seasonal allergies   . Swelling of extremity   . Trouble in sleeping   . Vitamin D deficiency     PAST SURGICAL HISTORY: No past surgical history on file.  SOCIAL HISTORY: Social History   Tobacco Use  . Smoking status: Never Smoker  . Smokeless tobacco: Never Used  Substance Use Topics  . Alcohol use: Yes    Alcohol/week: 0.0 standard drinks    Comment: socially  . Drug use: No    FAMILY HISTORY: Family History  Problem Relation Age of Onset  . Cancer Mother   . Obesity Father     ROS: Review of Systems  Constitutional: Positive for malaise/fatigue. Negative for weight loss.  Gastrointestinal: Negative for constipation, nausea and vomiting.  Endo/Heme/Allergies:       Positive for cravings  Psychiatric/Behavioral: Positive for depression.    PHYSICAL EXAM: There were no vitals taken for this visit. There is no height or weight on file to calculate BMI. Physical Exam Constitutional:      General: She is not in acute distress.    Appearance: She is well-developed.  Neurological:     Mental Status: She is alert and oriented to person, place, and time.  Psychiatric:        Mood and Affect: Mood normal.        Behavior: Behavior normal.     RECENT LABS AND TESTS: BMET    Component Value Date/Time   NA 138 05/23/2019 0838   K 4.2 05/23/2019 0838   CL 103 05/23/2019 0838   CO2 21 05/23/2019 0838   GLUCOSE 71 05/23/2019 0838   GLUCOSE 106 (H) 09/30/2018 0852   BUN 14 05/23/2019 0838   CREATININE 0.92 05/23/2019 0838   CREATININE 0.63 12/01/2014 1532   CALCIUM 8.9  05/23/2019 0838   GFRNONAA 75 05/23/2019 0838   GFRAA 87 05/23/2019 0838   Lab Results  Component Value Date   HGBA1C 5.1 05/23/2019   HGBA1C 5.4 09/30/2018   HGBA1C 5.4 04/19/2018   HGBA1C 5.3 12/21/2017   HGBA1C 5.5 08/26/2017   Lab Results  Component Value Date   INSULIN 7.6 05/23/2019   INSULIN 7.9 04/19/2018   INSULIN 8.6 12/21/2017   INSULIN 10.3 08/26/2017   CBC    Component Value Date/Time   WBC 7.5 04/19/2018 0834   WBC 5.9 07/14/2017 0905   RBC 4.38 04/19/2018 0834   RBC 4.19 07/14/2017 0905   HGB 13.8 04/19/2018 0834   HCT 41.4 04/19/2018 0834   PLT 262 04/19/2018 0834   MCV 95 04/19/2018 0834   MCH 31.5 04/19/2018 0834   MCH 31.2 12/01/2014 1532   MCHC 33.3 04/19/2018 0834   MCHC 33.8 07/14/2017 0905   RDW 12.2 (  L) 04/19/2018 0834   LYMPHSABS 1.9 04/19/2018 0834   MONOABS 0.4 07/14/2017 0905   EOSABS 0.6 (H) 04/19/2018 0834   BASOSABS 0.1 04/19/2018 0834   Iron/TIBC/Ferritin/ %Sat No results found for: IRON, TIBC, FERRITIN, IRONPCTSAT Lipid Panel     Component Value Date/Time   CHOL 172 05/23/2019 0838   TRIG 83 05/23/2019 0838   HDL 50 05/23/2019 0838   CHOLHDL 3.4 09/30/2018 0852   VLDL 11 09/30/2018 0852   LDLCALC 107 (H) 05/23/2019 0838   Hepatic Function Panel     Component Value Date/Time   PROT 6.5 05/23/2019 0838   ALBUMIN 3.9 05/23/2019 0838   AST 13 05/23/2019 0838   ALT 17 05/23/2019 0838   ALKPHOS 85 05/23/2019 0838   BILITOT 0.3 05/23/2019 0838   BILIDIR 0.1 05/12/2008 0942      Component Value Date/Time   TSH 4.03 07/14/2017 0905   TSH 1.688 12/01/2014 1532   TSH 1.93 05/12/2008 0942    Ref. Range 05/23/2019 08:38  Vitamin D, 25-Hydroxy Latest Ref Range: 30.0 - 100.0 ng/mL 36.0    I, Doreene Nest, am acting as Location manager for [employer], FNP-C  I have reviewed the above documentation for accuracy and completeness, and I agree with the above.  - Rosealee Recinos, FNP-C.

## 2019-06-17 MED ORDER — VITAMIN D 50 MCG (2000 UT) PO CAPS: 1.0000 | ORAL_CAPSULE | Freq: Every day | ORAL | 0 refills | Status: DC

## 2019-06-27 ENCOUNTER — Encounter (INDEPENDENT_AMBULATORY_CARE_PROVIDER_SITE_OTHER): Payer: Self-pay | Admitting: Family Medicine

## 2019-06-27 ENCOUNTER — Ambulatory Visit (INDEPENDENT_AMBULATORY_CARE_PROVIDER_SITE_OTHER): Payer: 59 | Admitting: Family Medicine

## 2019-06-27 ENCOUNTER — Other Ambulatory Visit: Payer: Self-pay

## 2019-06-27 VITALS — BP 107/72 | HR 67 | Temp 98.6°F | Ht 68.0 in | Wt 252.0 lb

## 2019-06-27 DIAGNOSIS — F3289 Other specified depressive episodes: Secondary | ICD-10-CM | POA: Diagnosis not present

## 2019-06-27 DIAGNOSIS — Z6838 Body mass index (BMI) 38.0-38.9, adult: Secondary | ICD-10-CM | POA: Diagnosis not present

## 2019-06-27 DIAGNOSIS — E8881 Metabolic syndrome: Secondary | ICD-10-CM | POA: Diagnosis not present

## 2019-06-27 MED ORDER — SAXENDA 18 MG/3ML ~~LOC~~ SOPN: 3.0000 mg | PEN_INJECTOR | Freq: Every day | SUBCUTANEOUS | 0 refills | Status: DC

## 2019-06-27 MED FILL — SAXENDA 18 MG/3 ML PEN: 18 | 30 days supply | Qty: 15 | Fill #0

## 2019-06-28 ENCOUNTER — Encounter (INDEPENDENT_AMBULATORY_CARE_PROVIDER_SITE_OTHER): Payer: Self-pay | Admitting: Family Medicine

## 2019-06-28 NOTE — Progress Notes (Signed)
Chief Complaint:   Wanda Peters is here to discuss her progress with her obesity treatment plan along with follow-up of her obesity related diagnoses. Wanda Peters is on following a lower carbohydrate, vegetable and lean protein rich diet plan and states she is following her eating plan approximately 90% of the time. Wanda Peters states she is exercising 0 minutes 0 times per week.  Today's visit was #: 64 Starting weight: 281 lbs Starting date: 08/26/2017 Today's weight: 252 lbs Today's date: 06/27/2019 Total lbs lost to date: 29 Total lbs lost since last in-office visit: 1  Interim History: Wanda Peters is on Saxenda at 1.5 mg daily. She reports polyphagia between 11:00 AM and 2:00 PM. She has been sticking quite closely to the low carb plan and she is disappointed with only a 1 pound loss.  Subjective:   Insulin resistance Wanda Peters has a diagnosis of insulin resistance based on her elevated fasting insulin level >5. She is not on metformin. Wanda Peters is on Saxenda and she is reporting some polyphagia. She continues to work on diet and exercise to decrease her risk of diabetes.  Lab Results  Component Value Date   INSULIN 7.6 05/23/2019   INSULIN 7.9 04/19/2018   INSULIN 8.6 12/21/2017   INSULIN 10.3 08/26/2017   Lab Results  Component Value Date   HGBA1C 5.1 05/23/2019   Other depression with emotional eating  Wanda Peters struggles with emotional eating and using food for comfort to the extent that it is negatively impacting her health. She is unsure if Bupropion is helping with stress eating. However, she feels stress eating is pretty well controlled.  She shows no sign of suicidal or homicidal ideations.  Assessment/Plan:   Insulin resistance Wanda Peters will continue with the meal plan and she will continue to work on weight loss, exercise, and decreasing simple carbohydrates to help decrease the risk of diabetes. Wanda Peters agreed to increase the dose of Saxenda  to 1.8 mg daily. She will follow-up with [name] as directed to closely monitor her progress.  Other depression with emotional eating  Since she is unsure if bupropion is helping with emotional eating, we will discontinue it.  Wanda Peters agrees to decrease the dose to 75 mg (1/2 pill) for 1 week and then stop Bupropion (discontinue Bupropion). Orders and follow up as documented in patient record.   Obesity Wanda Peters is currently in the action stage of change. As such, her goal is to continue with weight loss efforts. She has agreed to following a lower carbohydrate, vegetable and lean protein rich diet plan.   Behavioral modification strategies: better snacking choices and planning for success.  Wanda Peters agreed to increase the dose to 1.8 mg daily of Saxenda. Prescription was written today for refill of Saxenda 3.0 mg sub Q daily #5 pens with no refills.  Wanda Peters has agreed to follow-up with our clinic in 3 weeks. She was informed of the importance of frequent follow-up visits to maximize her success with intensive lifestyle modifications for her multiple health conditions.   Objective:   Blood pressure 107/72, pulse 67, temperature 98.6 F (37 C), temperature source Oral, height 5\' 8"  (1.727 m), weight 252 lb (114.3 kg), SpO2 99 %. Body mass index is 38.32 kg/m.  General: Cooperative, alert, well developed, in no acute distress. HEENT: Conjunctivae and lids unremarkable. Cardiovascular: Regular rhythm.  Lungs: Normal work of breathing. Neurologic: No focal deficits.   Lab Results  Component Value Date   CREATININE 0.92 05/23/2019   BUN 14 05/23/2019  NA 138 05/23/2019   K 4.2 05/23/2019   CL 103 05/23/2019   CO2 21 05/23/2019   Lab Results  Component Value Date   ALT 17 05/23/2019   AST 13 05/23/2019   ALKPHOS 85 05/23/2019   BILITOT 0.3 05/23/2019   Lab Results  Component Value Date   HGBA1C 5.1 05/23/2019   HGBA1C 5.4 09/30/2018   HGBA1C 5.4 04/19/2018   HGBA1C 5.3  12/21/2017   HGBA1C 5.5 08/26/2017   Lab Results  Component Value Date   INSULIN 7.6 05/23/2019   INSULIN 7.9 04/19/2018   INSULIN 8.6 12/21/2017   INSULIN 10.3 08/26/2017   Lab Results  Component Value Date   TSH 4.03 07/14/2017   Lab Results  Component Value Date   CHOL 172 05/23/2019   HDL 50 05/23/2019   LDLCALC 107 (H) 05/23/2019   TRIG 83 05/23/2019   CHOLHDL 3.4 09/30/2018   Lab Results  Component Value Date   WBC 7.5 04/19/2018   HGB 13.8 04/19/2018   HCT 41.4 04/19/2018   MCV 95 04/19/2018   PLT 262 04/19/2018   No results found for: IRON, TIBC, FERRITIN   Ref. Range 05/23/2019 08:38  Vitamin D, 25-Hydroxy Latest Ref Range: 30.0 - 100.0 ng/mL 36.0    Attestation Statements:   Reviewed by clinician on day of visit: allergies, medications, problem list, medical history, surgical history, family history, social history, and previous encounter notes.  Corey Skains, am acting as Location manager for [employer], FNP-C.  I have reviewed the above documentation for accuracy and completeness, and I agree with the above. -  Merrilee Ancona [employer], FNP-C

## 2019-06-30 MED FILL — ESCITALOPRAM 10 MG TABLET: 10 | 30 days supply | Qty: 30 | Fill #0

## 2019-06-30 MED FILL — HYDROCHLOROTHIAZIDE 25 MG T: 25 | 30 days supply | Qty: 30 | Fill #0

## 2019-06-30 MED FILL — BUPROPION HCL SR 200 MG TAB: 200 | 30 days supply | Qty: 30 | Fill #0

## 2019-07-18 ENCOUNTER — Ambulatory Visit (INDEPENDENT_AMBULATORY_CARE_PROVIDER_SITE_OTHER): Payer: 59 | Admitting: Family Medicine

## 2019-07-18 ENCOUNTER — Other Ambulatory Visit: Payer: Self-pay

## 2019-07-18 ENCOUNTER — Encounter (INDEPENDENT_AMBULATORY_CARE_PROVIDER_SITE_OTHER): Payer: Self-pay | Admitting: Family Medicine

## 2019-07-18 VITALS — BP 112/78 | HR 65 | Temp 98.1°F | Ht 68.0 in | Wt 249.0 lb

## 2019-07-18 DIAGNOSIS — Z6837 Body mass index (BMI) 37.0-37.9, adult: Secondary | ICD-10-CM | POA: Diagnosis not present

## 2019-07-18 DIAGNOSIS — R609 Edema, unspecified: Secondary | ICD-10-CM

## 2019-07-18 DIAGNOSIS — F3289 Other specified depressive episodes: Secondary | ICD-10-CM | POA: Diagnosis not present

## 2019-07-18 MED ORDER — HYDROCHLOROTHIAZIDE 25 MG PO TABS: 25.0000 mg | ORAL_TABLET | Freq: Every day | ORAL | 0 refills | Status: DC

## 2019-07-18 MED ORDER — ESCITALOPRAM OXALATE 10 MG PO TABS: 10.0000 mg | ORAL_TABLET | Freq: Every day | ORAL | 0 refills | Status: DC

## 2019-07-19 ENCOUNTER — Encounter (INDEPENDENT_AMBULATORY_CARE_PROVIDER_SITE_OTHER): Payer: Self-pay | Admitting: Family Medicine

## 2019-07-19 NOTE — Progress Notes (Signed)
Chief Complaint:   OBESITY Wanda Peters is here to discuss her progress with her obesity treatment plan along with follow-up of her obesity related diagnoses. Wanda Peters is on following a lower carbohydrate, vegetable and lean protein rich diet plan and states she is following her eating plan approximately 80% of the time. Wanda Peters states she is doing 0 minutes 0 times per week.  Today's visit was #: 54 Starting weight: 281 lbs Starting date: 08/26/17 Today's weight: 249 lbs Today's date: 07/18/2019 Total lbs lost to date: 32 Total lbs lost since last in-office visit: 3  Interim History: Wanda Peters is taking Saxenda 1.8 mg daily and she denies side effects. Her dose was increased at her last visit and her hunger is well controlled. She still likes the low carbohydrate plan. She is eating about 5 times per day. She has cut out diet soda completely. She has protein powder in her coffee in the morning.  Subjective:   1. Peripheral edema Wanda Peters notes that hydrochlorothiazide does relieve her ankle swelling.  2. Other depression Wanda Peters's mood is stable on Lexapro. Bupropion was discontinued recently and she denies emotional eating.  Assessment/Plan:   1. Peripheral edema We will refill hydrochlorothiazide for 1 month, and we will continue to monitor.  - hydrochlorothiazide (HYDRODIURIL) 25 MG tablet; Take 1 tablet (25 mg total) by mouth daily.  Dispense: 30 tablet; Refill: 0  2. Other depression Behavior modification techniques were discussed today to help Wanda Peters deal with her emotional/non-hunger eating behaviors. We will refill Lexapro for 1 month. Orders and follow up as documented in patient record.   - escitalopram (LEXAPRO) 10 MG tablet; Take 1 tablet (10 mg total) by mouth daily.  Dispense: 30 tablet; Refill: 0  3. Class 2 severe obesity with serious comorbidity and body mass index (BMI) of 37.0 to 37.9 in adult, unspecified obesity type Wanda Peters is currently in  the action stage of change. As such, her goal is to continue with weight loss efforts. She has agreed to following a lower carbohydrate, vegetable and lean protein rich diet plan.   Exercise goals: Wanda Peters is to walk on her days off and increase walking during the course of the day at work.  Behavioral modification strategies: decreasing simple carbohydrates and planning for success.  Wanda Peters has agreed to follow-up with our clinic in 3 weeks. She was informed of the importance of frequent follow-up visits to maximize her success with intensive lifestyle modifications for her multiple health conditions.   Objective:   Blood pressure 112/78, pulse 65, temperature 98.1 F (36.7 C), temperature source Oral, height 5\' 8"  (1.727 m), weight 249 lb (112.9 kg), last menstrual period 06/17/2019, SpO2 99 %. Body mass index is 37.86 kg/m.  General: Cooperative, alert, well developed, in no acute distress. HEENT: Conjunctivae and lids unremarkable. Cardiovascular: Regular rhythm.  Lungs: Normal work of breathing. Neurologic: No focal deficits.   Lab Results  Component Value Date   CREATININE 0.92 05/23/2019   BUN 14 05/23/2019   NA 138 05/23/2019   K 4.2 05/23/2019   CL 103 05/23/2019   CO2 21 05/23/2019   Lab Results  Component Value Date   ALT 17 05/23/2019   AST 13 05/23/2019   ALKPHOS 85 05/23/2019   BILITOT 0.3 05/23/2019   Lab Results  Component Value Date   HGBA1C 5.1 05/23/2019   HGBA1C 5.4 09/30/2018   HGBA1C 5.4 04/19/2018   HGBA1C 5.3 12/21/2017   HGBA1C 5.5 08/26/2017   Lab Results  Component Value  Date   INSULIN 7.6 05/23/2019   INSULIN 7.9 04/19/2018   INSULIN 8.6 12/21/2017   INSULIN 10.3 08/26/2017   Lab Results  Component Value Date   TSH 4.03 07/14/2017   Lab Results  Component Value Date   CHOL 172 05/23/2019   HDL 50 05/23/2019   LDLCALC 107 (H) 05/23/2019   TRIG 83 05/23/2019   CHOLHDL 3.4 09/30/2018   Lab Results  Component Value Date    WBC 7.5 04/19/2018   HGB 13.8 04/19/2018   HCT 41.4 04/19/2018   MCV 95 04/19/2018   PLT 262 04/19/2018   No results found for: IRON, TIBC, FERRITIN  Attestation Statements:   Reviewed by clinician on day of visit: allergies, medications, problem list, medical history, surgical history, family history, social history, and previous encounter notes.   Wilhemena Durie, am acting as Location manager for [employer], FNP-C.  I have reviewed the above documentation for accuracy and completeness, and I agree with the above. -  Georgianne Fick, FNP

## 2019-07-24 ENCOUNTER — Ambulatory Visit (INDEPENDENT_AMBULATORY_CARE_PROVIDER_SITE_OTHER)
Admission: RE | Admit: 2019-07-24 | Discharge: 2019-07-24 | Disposition: A | Discharge status: Home / Self Care | Payer: 59 | Source: Ambulatory Visit

## 2019-07-24 DIAGNOSIS — B369 Superficial mycosis, unspecified: Secondary | ICD-10-CM | POA: Diagnosis not present

## 2019-07-24 MED ORDER — KETOCONAZOLE 2 % EX CREA: 1.0000 "application " | TOPICAL_CREAM | Freq: Every day | CUTANEOUS | 0 refills | Status: DC

## 2019-07-24 NOTE — Discharge Instructions (Signed)
Take medication as prescribed.

## 2019-07-24 NOTE — ED Provider Notes (Signed)
Virtual Visit via Video Note:  Wanda Peters  initiated request for Telemedicine visit with [facility] team. I connected with Wanda Peters  on 07/24/2019 at 9:27 AM  for a synchronized telemedicine visit using a video enabled HIPPA compliant telemedicine application. I verified that I am speaking with Wanda Peters  using two identifiers. Emerson Monte, FNP  was physically located in a [facility]gent care site and Wanda Peters was located at a different location.   The limitations of evaluation and management by telemedicine as well as the availability of in-person appointments were discussed. Patient was informed that she  may incur a bill ( including co-pay) for this virtual visit encounter. Wanda Peters  expressed understanding and gave verbal consent to proceed with virtual visit.    History of Present Illness:Wanda Peters  is a 44 y.o. female presents with Patient also complains of rash under her left breast for the past few weeks.  She denies changes in soaps, detergents, or anyone with similar symptoms.  She localizes the rash to her left breast.  She describes it as itchy, red, and spreading.  She has tried hydrocortisone without relief.  Her symptoms are made worse with hydrocortisone use.  She denies similar symptoms in the past Past Medical History:  Diagnosis Date  . Dry skin   . Fatigue   . Rupture of ovarian cyst   . Seasonal allergies   . Swelling of extremity   . Trouble in sleeping   . Vitamin D deficiency     Allergies  Allergen Reactions  . Sulfonamide Derivatives     REACTION: rash        Observations/Objective:VITALS: Per patient if applicable, see vitals. GENERAL: Alert, appears well and in no acute distress. NECK: Normal movements of the head and neck. CARDIOPULMONARY: No increased WOB. Speaking in clear sentences. I:E ratio WNL.  PSYCH: Pleasant and cooperative, well-groomed. Speech  normal rate and rhythm. Affect is appropriate. Insight and judgement are appropriate. Attention is focused, linear, and appropriate.   SKIN: No obvious lesions, wounds, erythema, or cyanosis noted on face.     Assessment and Plan: Superficial fungus infection of the skin Metronidazole topical cream was prescribed  Follow Up Instructions: Follow-up with primary care or return for worsening of symptoms   I discussed the assessment and treatment plan with the patient. The patient was provided an opportunity to ask questions and all were answered. The patient agreed with the plan and demonstrated an understanding of the instructions.   The patient was advised to call back or seek an in-person evaluation if the symptoms worsen or if the condition fails to improve as anticipated.  I provided 15 minutes of non-face-to-face time during this encounter.    Emerson Monte, FNP  07/24/2019 9:27 AM         Emerson Monte, FNP 07/24/19 [phone]

## 2019-08-03 MED FILL — HYDROCHLOROTHIAZIDE 25 MG T: 25 | 30 days supply | Qty: 30 | Fill #0

## 2019-08-03 MED FILL — ESCITALOPRAM 10 MG TABLET: 10 | 30 days supply | Qty: 30 | Fill #0

## 2019-08-09 ENCOUNTER — Encounter (INDEPENDENT_AMBULATORY_CARE_PROVIDER_SITE_OTHER): Payer: Self-pay | Admitting: Family Medicine

## 2019-08-09 ENCOUNTER — Other Ambulatory Visit: Payer: Self-pay

## 2019-08-09 ENCOUNTER — Ambulatory Visit (INDEPENDENT_AMBULATORY_CARE_PROVIDER_SITE_OTHER): Payer: 59 | Admitting: Family Medicine

## 2019-08-09 VITALS — BP 111/72 | HR 73 | Temp 98.1°F | Ht 68.0 in | Wt 248.0 lb

## 2019-08-09 DIAGNOSIS — R609 Edema, unspecified: Secondary | ICD-10-CM

## 2019-08-09 DIAGNOSIS — F3289 Other specified depressive episodes: Secondary | ICD-10-CM | POA: Diagnosis not present

## 2019-08-09 DIAGNOSIS — Z6837 Body mass index (BMI) 37.0-37.9, adult: Secondary | ICD-10-CM | POA: Diagnosis not present

## 2019-08-09 DIAGNOSIS — R600 Localized edema: Secondary | ICD-10-CM

## 2019-08-09 DIAGNOSIS — E66812 Obesity, class 2: Secondary | ICD-10-CM

## 2019-08-09 MED ORDER — BUPROPION HCL ER (SR) 150 MG PO TB12: 150.0000 mg | ORAL_TABLET | Freq: Every day | ORAL | 0 refills | Status: DC

## 2019-08-09 MED ORDER — HYDROCHLOROTHIAZIDE 25 MG PO TABS: 25.0000 mg | ORAL_TABLET | Freq: Every day | ORAL | 0 refills | Status: DC

## 2019-08-09 MED ORDER — ESCITALOPRAM OXALATE 10 MG PO TABS: 10.0000 mg | ORAL_TABLET | Freq: Every day | ORAL | 0 refills | Status: DC

## 2019-08-09 MED ORDER — SAXENDA 18 MG/3ML ~~LOC~~ SOPN: 3.0000 mg | PEN_INJECTOR | Freq: Every day | SUBCUTANEOUS | 0 refills | Status: DC

## 2019-08-09 MED FILL — SAXENDA 18 MG/3 ML PEN: 18 | 30 days supply | Qty: 15 | Fill #0

## 2019-08-09 MED FILL — BUPROPION HCL SR 150 MG TAB: 150 | 30 days supply | Qty: 30 | Fill #0

## 2019-08-09 NOTE — Progress Notes (Signed)
Chief Complaint:   OBESITY Wanda Peters is here to discuss her progress with her obesity treatment plan along with follow-up of her obesity related diagnoses. [name] is on following a lower carbohydrate, vegetable and lean protein rich diet plan and states she is following her eating plan approximately 80% of the time. [name] states she is walking for 30 minutes 3-4 times per week.  Today's visit was #: 27 Starting weight: 281 lbs Starting date: 08/26/17 Today's weight: 248 lbs Today's date: 08/09/2019 Total lbs lost to date: 33 Total lbs lost since last in-office visit: 1  Interim History: Wanda Peters wants to continue the Low carbohydrate plan. She is on Saxenda 1.8 mg daily and this works well for appetite suppression. She reports frustration with slow weight loss and she is looking into having bariatric surgery.  Subjective:   1. Peripheral edema [name]'s hydrochlorothiazide controls her peripheral edema well.  2. Other depression [name] notes increased cravings after stopping bupropion, and she has started taking it again. She is on Lexapro 10 mg and her mood is stable. She takes Lexapro for insomnia.  Assessment/Plan:   1. Peripheral edema We will refill hydrochlorothiazide for 1 month, and will continue to monitor.  - hydrochlorothiazide (HYDRODIURIL) 25 MG tablet; Take 1 tablet (25 mg total) by mouth daily.  Dispense: 30 tablet; Refill: 0  2. Other depression Behavior modification techniques were discussed today to help [name] deal with her emotional/non-hunger eating behaviors. [name] agreed to restart bupropion 150 mg daily with no refills, and we will refill Lexapro for 1 month. Orders and follow up as documented in patient record.   - escitalopram (LEXAPRO) 10 MG tablet; Take 1 tablet (10 mg total) by mouth daily.  Dispense: 30 tablet; Refill: 0 - buPROPion (WELLBUTRIN SR) 150 MG 12 hr tablet; Take 1 tablet (150 mg total) by mouth daily.  Dispense: 30  tablet; Refill: 0  3. Class 2 severe obesity with serious comorbidity and body mass index (BMI) of 37.0 to 37.9 in adult, unspecified obesity type [facility]) Wanda Peters is currently in the action stage of change. As such, her goal is to continue with weight loss efforts. She has agreed to following a lower carbohydrate, vegetable and lean protein rich diet plan.   We discussed various medication options to help Wanda Peters with her weight loss efforts and we both agreed to continue Saxenda, and we will refill for 1 month.  - Liraglutide -Weight Management (SAXENDA) 18 MG/3ML SOPN; Inject 0.5 mLs (3 mg total) into the skin daily.  Dispense: 5 pen; Refill: 0  Exercise goals: Wanda Peters is to continue her current exercise regimen as is.  Behavioral modification strategies: planning for success. We discussed bariatric surgery and the importance of healthy eating habits after surgery for long-term weight loss.   Wanda Peters has agreed to follow-up with our clinic in 3 weeks. She was informed of the importance of frequent follow-up visits to maximize her success with intensive lifestyle modifications for her multiple health conditions.   Objective:   Blood pressure 111/72, pulse 73, temperature 98.1 F (36.7 C), temperature source Oral, height 5\' 8"  (1.727 m), weight 248 lb (112.5 kg), last menstrual period 07/12/2019, SpO2 97 %. Body mass index is 37.71 kg/m.  General: Cooperative, alert, well developed, in no acute distress. HEENT: Conjunctivae and lids unremarkable. Cardiovascular: Regular rhythm.  Lungs: Normal work of breathing. Neurologic: No focal deficits.   Lab Results  Component Value Date   CREATININE 0.92 05/23/2019   BUN 14 05/23/2019  NA 138 05/23/2019   K 4.2 05/23/2019   CL 103 05/23/2019   CO2 21 05/23/2019   Lab Results  Component Value Date   ALT 17 05/23/2019   AST 13 05/23/2019   ALKPHOS 85 05/23/2019   BILITOT 0.3 05/23/2019   Lab Results  Component Value Date    HGBA1C 5.1 05/23/2019   HGBA1C 5.4 09/30/2018   HGBA1C 5.4 04/19/2018   HGBA1C 5.3 12/21/2017   HGBA1C 5.5 08/26/2017   Lab Results  Component Value Date   INSULIN 7.6 05/23/2019   INSULIN 7.9 04/19/2018   INSULIN 8.6 12/21/2017   INSULIN 10.3 08/26/2017   Lab Results  Component Value Date   TSH 4.03 07/14/2017   Lab Results  Component Value Date   CHOL 172 05/23/2019   HDL 50 05/23/2019   LDLCALC 107 (H) 05/23/2019   TRIG 83 05/23/2019   CHOLHDL 3.4 09/30/2018   Lab Results  Component Value Date   WBC 7.5 04/19/2018   HGB 13.8 04/19/2018   HCT 41.4 04/19/2018   MCV 95 04/19/2018   PLT 262 04/19/2018   No results found for: IRON, TIBC, FERRITIN  Attestation Statements:   Reviewed by clinician on day of visit: allergies, medications, problem list, medical history, surgical history, family history, social history, and previous encounter notes.   Wilhemena Durie, am acting as Location manager for [employer], FNP-C.  I have reviewed the above documentation for accuracy and completeness, and I agree with the above. -  Georgianne Fick, FNP

## 2019-08-10 ENCOUNTER — Encounter (INDEPENDENT_AMBULATORY_CARE_PROVIDER_SITE_OTHER): Payer: Self-pay | Admitting: Family Medicine

## 2019-08-30 ENCOUNTER — Other Ambulatory Visit: Payer: Self-pay

## 2019-08-30 ENCOUNTER — Ambulatory Visit (INDEPENDENT_AMBULATORY_CARE_PROVIDER_SITE_OTHER): Payer: 59 | Admitting: Family Medicine

## 2019-08-30 ENCOUNTER — Encounter (INDEPENDENT_AMBULATORY_CARE_PROVIDER_SITE_OTHER): Payer: Self-pay | Admitting: Family Medicine

## 2019-08-30 VITALS — BP 95/65 | HR 72 | Temp 98.4°F | Ht 68.0 in | Wt 247.0 lb

## 2019-08-30 DIAGNOSIS — F3289 Other specified depressive episodes: Secondary | ICD-10-CM | POA: Diagnosis not present

## 2019-08-30 DIAGNOSIS — R609 Edema, unspecified: Secondary | ICD-10-CM | POA: Diagnosis not present

## 2019-08-30 DIAGNOSIS — Z6837 Body mass index (BMI) 37.0-37.9, adult: Secondary | ICD-10-CM | POA: Diagnosis not present

## 2019-08-30 DIAGNOSIS — R600 Localized edema: Secondary | ICD-10-CM

## 2019-08-30 MED ORDER — BUPROPION HCL ER (SR) 150 MG PO TB12: 150.0000 mg | ORAL_TABLET | Freq: Every day | ORAL | 0 refills | Status: DC

## 2019-08-30 MED ORDER — HYDROCHLOROTHIAZIDE 25 MG PO TABS: 25.0000 mg | ORAL_TABLET | Freq: Every day | ORAL | 0 refills | Status: DC

## 2019-08-30 MED ORDER — ESCITALOPRAM OXALATE 10 MG PO TABS: 10.0000 mg | ORAL_TABLET | Freq: Every day | ORAL | 0 refills | Status: DC

## 2019-08-30 MED FILL — ESCITALOPRAM 10 MG TABLET: 10 | 30 days supply | Qty: 30 | Fill #0

## 2019-08-30 MED FILL — HYDROCHLOROTHIAZIDE 25 MG T: 25 | 30 days supply | Qty: 30 | Fill #0

## 2019-08-30 NOTE — Progress Notes (Signed)
Chief Complaint:   OBESITY Wanda Peters is here to discuss her progress with her obesity treatment plan along with follow-up of her obesity related diagnoses. Wanda Peters is on following a lower carbohydrate, vegetable and lean protein rich diet plan and states she is following her eating plan approximately 80% of the time. Wanda Peters states she is walking for 30-45 minutes 3-4 times per week.  Today's visit was #: 34 Starting weight: 281 lbs Starting date: 08/26/2017 Today's weight: 247 lbs Today's date: 08/30/2019 Total lbs lost to date: 34 Total lbs lost since last in-office visit: 1  Interim History: Wanda Peters adhered to the Low carbohydrate plan very well. She does feel she goes over on portion sizes at times. She is drinking plenty of water and eating adequate amount of vegetables. Wanda Peters has an appointment to meet with a bariatric surgeon this week at [facility]. She is interested in having a sleeve gastrectomy. She feels that she has put in so much effort over the past few years for only 34 lbs of weight loss. She would like another tool to assist with getting her to her weight goal.   Subjective:   1. Peripheral edema Wanda Peters's blood pressure is well controlled with hydrochlorothiazide.  2. Other depression Wanda Peters's mood is stable on bupropion and Lexapro. She denies stress eating.  Assessment/Plan:   1. Peripheral edema We will refill hydrochlorothiazide for 1 month.  - hydrochlorothiazide (HYDRODIURIL) 25 MG tablet; Take 1 tablet (25 mg total) by mouth daily.  Dispense: 30 tablet; Refill: 0  2. Other depression Behavior modification techniques were discussed today to help Wanda Peters deal with her emotional/non-hunger eating behaviors. We will refill bupropion and Lexapro for 1 month. Orders and follow up as documented in patient record.   - buPROPion (WELLBUTRIN SR) 150 MG 12 hr tablet; Take 1 tablet (150 mg total) by mouth daily.  Dispense: 30 tablet;  Refill: 0 - escitalopram (LEXAPRO) 10 MG tablet; Take 1 tablet (10 mg total) by mouth daily.  Dispense: 30 tablet; Refill: 0  3. Class 2 severe obesity with serious comorbidity and body mass index (BMI) of 37.0 to 37.9 in adult, unspecified obesity type [facility]) Wanda Peters is currently in the action stage of change. As such, her goal is to continue with weight loss efforts. She has agreed to following a lower carbohydrate, vegetable and lean protein rich diet plan.   Wanda Peters will work on portion control.  Exercise goals: Wanda Peters will increase exercise to 150 minutes per week.  Behavioral modification strategies: planning for success.  Wanda Peters has agreed to follow-up with our clinic in 3 weeks. She was informed of the importance of frequent follow-up visits to maximize her success with intensive lifestyle modifications for her multiple health conditions.   Objective:   Blood pressure 95/65, pulse 72, temperature 98.4 F (36.9 C), temperature source Oral, height 5\' 8"  (1.727 m), weight 247 lb (112 kg), last menstrual period 08/02/2019, SpO2 97 %. Body mass index is 37.56 kg/m.  General: Cooperative, alert, well developed, in no acute distress. HEENT: Conjunctivae and lids unremarkable. Cardiovascular: Regular rhythm.  Lungs: Normal work of breathing. Neurologic: No focal deficits.   Lab Results  Component Value Date   CREATININE 0.92 05/23/2019   BUN 14 05/23/2019   NA 138 05/23/2019   K 4.2 05/23/2019   CL 103 05/23/2019   CO2 21 05/23/2019   Lab Results  Component Value Date   ALT 17 05/23/2019   AST 13 05/23/2019   ALKPHOS 85 05/23/2019  BILITOT 0.3 05/23/2019   Lab Results  Component Value Date   HGBA1C 5.1 05/23/2019   HGBA1C 5.4 09/30/2018   HGBA1C 5.4 04/19/2018   HGBA1C 5.3 12/21/2017   HGBA1C 5.5 08/26/2017   Lab Results  Component Value Date   INSULIN 7.6 05/23/2019   INSULIN 7.9 04/19/2018   INSULIN 8.6 12/21/2017   INSULIN 10.3 08/26/2017   Lab  Results  Component Value Date   TSH 4.03 07/14/2017   Lab Results  Component Value Date   CHOL 172 05/23/2019   HDL 50 05/23/2019   LDLCALC 107 (H) 05/23/2019   TRIG 83 05/23/2019   CHOLHDL 3.4 09/30/2018   Lab Results  Component Value Date   WBC 7.5 04/19/2018   HGB 13.8 04/19/2018   HCT 41.4 04/19/2018   MCV 95 04/19/2018   PLT 262 04/19/2018   No results found for: IRON, TIBC, FERRITIN  Attestation Statements:   Reviewed by clinician on day of visit: allergies, medications, problem list, medical history, surgical history, family history, social history, and previous encounter notes.   Wilhemena Durie, am acting as Location manager for [employer], FNP-C.  I have reviewed the above documentation for accuracy and completeness, and I agree with the above. -  Georgianne Fick, FNP

## 2019-09-01 DIAGNOSIS — E7800 Pure hypercholesterolemia, unspecified: Secondary | ICD-10-CM | POA: Diagnosis not present

## 2019-09-01 DIAGNOSIS — M25552 Pain in left hip: Secondary | ICD-10-CM | POA: Diagnosis not present

## 2019-09-01 DIAGNOSIS — G8929 Other chronic pain: Secondary | ICD-10-CM | POA: Diagnosis not present

## 2019-09-01 DIAGNOSIS — M25551 Pain in right hip: Secondary | ICD-10-CM | POA: Diagnosis not present

## 2019-09-02 ENCOUNTER — Other Ambulatory Visit (HOSPITAL_COMMUNITY): Payer: Self-pay | Admitting: General Surgery

## 2019-09-02 ENCOUNTER — Other Ambulatory Visit: Payer: Self-pay | Admitting: General Surgery

## 2019-09-06 ENCOUNTER — Ambulatory Visit
Admission: RE | Admit: 2019-09-06 | Discharge: 2019-09-06 | Disposition: A | Discharge status: Home / Self Care | Payer: 59 | Source: Ambulatory Visit | Attending: General Surgery | Admitting: General Surgery

## 2019-09-06 ENCOUNTER — Other Ambulatory Visit: Payer: Self-pay

## 2019-09-06 DIAGNOSIS — Z01818 Encounter for other preprocedural examination: Secondary | ICD-10-CM | POA: Diagnosis not present

## 2019-09-06 DIAGNOSIS — Z01810 Encounter for preprocedural cardiovascular examination: Secondary | ICD-10-CM | POA: Diagnosis not present

## 2019-09-07 ENCOUNTER — Encounter: Payer: Self-pay | Admitting: Skilled Nursing Facility1

## 2019-09-07 ENCOUNTER — Encounter: Payer: 59 | Attending: General Surgery | Admitting: Skilled Nursing Facility1

## 2019-09-07 DIAGNOSIS — Z882 Allergy status to sulfonamides status: Secondary | ICD-10-CM | POA: Insufficient documentation

## 2019-09-07 DIAGNOSIS — Z79899 Other long term (current) drug therapy: Secondary | ICD-10-CM | POA: Diagnosis not present

## 2019-09-07 DIAGNOSIS — M25552 Pain in left hip: Secondary | ICD-10-CM | POA: Insufficient documentation

## 2019-09-07 DIAGNOSIS — Z6841 Body Mass Index (BMI) 40.0 and over, adult: Secondary | ICD-10-CM | POA: Insufficient documentation

## 2019-09-07 DIAGNOSIS — M25551 Pain in right hip: Secondary | ICD-10-CM | POA: Diagnosis not present

## 2019-09-07 DIAGNOSIS — E7800 Pure hypercholesterolemia, unspecified: Secondary | ICD-10-CM | POA: Diagnosis not present

## 2019-09-07 DIAGNOSIS — Z713 Dietary counseling and surveillance: Secondary | ICD-10-CM | POA: Insufficient documentation

## 2019-09-07 DIAGNOSIS — G8929 Other chronic pain: Secondary | ICD-10-CM | POA: Diagnosis not present

## 2019-09-07 NOTE — Progress Notes (Signed)
Nutrition Assessment for Bariatric Surgery Medical Nutrition Therapy  Patient was seen on 09/07/2019 for Pre-Operative Nutrition Assessment. Letter of approval faxed to [facility] bariatric surgery program coordinator on 09/07/2019  Referral stated Supervised Weight Loss (SWL) visits needed: 0  Planned surgery: Sleeve Gastrectomy  Pt expectation of surgery: to lose weight  Pt expectation of dietitian: none stated    NUTRITION ASSESSMENT   Anthropometrics  Start weight at [facility]: 257 lbs (date: 09/07/2019)  Height: 61 in BMI: 41.48 kg/m2     Clinical  Medical hx: N/A Medications: saxenda (for weight loss), Wellbutrin (for weight loss)  Labs:  Notable signs/symptoms: N/A  Lifestyle & Dietary Hx  Pt states she has been walking for 30 minutes 3-4 days a week.   24-Hr Dietary Recall First Meal: protein powder in coffee Snack: cheese stick or almonds  Second Meal: cauliflower rice with shrimp or keto bread sandwich Snack: quest chips Third Meal: chicken and zucchini Snack: quest cookie Beverages: water with flavoring, seltzer water, unsweet tea, coffee with protein powder    Estimated Energy Needs Calories: 1600   NUTRITION DIAGNOSIS  Overweight/obesity ([place]-3.3) related to past poor dietary habits and physical inactivity as evidenced by patient w/ planned sleeve surgery following dietary guidelines for continued weight loss.    NUTRITION INTERVENTION  Nutrition counseling (C-1) and education (E-2) to facilitate bariatric surgery goals.   Pre-Op Goals Reviewed with the Patient . Track food and beverage intake (pen and paper, MyFitness Pal, Baritastic app, etc.) . Make healthy food choices while monitoring portion sizes . Consume 3 meals per day or try to eat every 3-5 hours . Avoid concentrated sugars and fried foods . Keep sugar & fat in the single digits per serving on food labels . Practice CHEWING your food (aim for applesauce consistency) . Practice  not drinking 15 minutes before, during, and 30 minutes after each meal and snack . Avoid all carbonated beverages (ex: soda, sparkling beverages)  . Limit caffeinated beverages (ex: coffee, tea, energy drinks) . Avoid all sugar-sweetened beverages (ex: regular soda, sports drinks)  . Avoid alcohol  . Aim for 64-100 ounces of FLUID daily (with at least half of fluid intake being plain water)  . Aim for at least 60-80 grams of PROTEIN daily . Look for a liquid protein source that contains ?15 g protein and ?5 g carbohydrate (ex: shakes, drinks, shots) . Make a list of non-food related activities . Physical activity is an important part of a healthy lifestyle so keep it moving! The goal is to reach 150 minutes of exercise per week, including cardiovascular and weight baring activity.  *Goals that are bolded indicate the pt would like to start working towards these  Handouts Provided Include  . Bariatric Surgery handouts (Nutrition Visits, Pre-Op Goals, Protein Shakes, Vitamins & Minerals)  Learning Style & Readiness for Change Teaching method utilized: Visual & Auditory  Demonstrated degree of understanding via: Teach Back  Barriers to learning/adherence to lifestyle change: none identified       MONITORING & EVALUATION Dietary intake, weekly physical activity, body weight, and pre-op goals reached at next nutrition visit.    Next Steps  Patient is to follow up at [facility] for Pre-Op Class >2 weeks before surgery for further nutrition education.

## 2019-09-12 ENCOUNTER — Encounter: Payer: 59 | Attending: General Surgery | Admitting: Skilled Nursing Facility1

## 2019-09-12 ENCOUNTER — Other Ambulatory Visit: Payer: Self-pay

## 2019-09-12 DIAGNOSIS — Z6837 Body mass index (BMI) 37.0-37.9, adult: Secondary | ICD-10-CM | POA: Diagnosis not present

## 2019-09-12 NOTE — Progress Notes (Signed)
Pre-Operative Nutrition Class:  Appt start time: 0938   End time:  1830.  Patient was seen on 09/12/2019 for Pre-Operative Bariatric Surgery Education [facility]   Surgery date:  Surgery type: sleeve [facility] weight at [facility]: 257 Weight today: 257.7  Samples given per MNT protocol. Patient educated on appropriate usage: Bariatric Advantage Multivitamin Lot #[id number] Exp:08/21  Bariatric Advantage Calcium  Lot #[id number] Exp:06/11/2020  Protein Shake Lot #[id number] Exp:10/24/2020  The following the learning objectives were met by the patient during this course:  Identify Pre-Op Dietary Goals and will begin 2 weeks pre-operatively  Identify appropriate sources of fluids and proteins   State protein recommendations and appropriate sources pre and post-operatively  Identify Post-Operative Dietary Goals and will follow for 2 weeks post-operatively  Identify appropriate multivitamin and calcium sources  Describe the need for physical activity post-operatively and will follow MD recommendations  State when to call healthcare provider regarding medication questions or post-operative complications  Handouts given during class include:  Pre-Op Bariatric Surgery Diet Handout  Protein Shake Handout  Post-Op Bariatric Surgery Nutrition Handout  [facility] Program Information Flyer  Support Group Information Flyer  [facility] Bariatric Supplements Price List  Follow-Up Plan: Patient will follow-up at [facility] 2 weeks post operatively for diet advancement per MD.

## 2019-09-20 ENCOUNTER — Ambulatory Visit (INDEPENDENT_AMBULATORY_CARE_PROVIDER_SITE_OTHER): Payer: 59 | Admitting: Family Medicine

## 2019-09-20 ENCOUNTER — Encounter (INDEPENDENT_AMBULATORY_CARE_PROVIDER_SITE_OTHER): Payer: Self-pay | Admitting: Family Medicine

## 2019-09-20 ENCOUNTER — Other Ambulatory Visit: Payer: Self-pay

## 2019-09-20 VITALS — BP 95/67 | HR 61 | Temp 98.3°F | Ht 66.0 in | Wt 254.0 lb

## 2019-09-20 DIAGNOSIS — Z6833 Body mass index (BMI) 33.0-33.9, adult: Secondary | ICD-10-CM | POA: Insufficient documentation

## 2019-09-20 DIAGNOSIS — E669 Obesity, unspecified: Secondary | ICD-10-CM | POA: Insufficient documentation

## 2019-09-20 DIAGNOSIS — F3289 Other specified depressive episodes: Secondary | ICD-10-CM

## 2019-09-20 DIAGNOSIS — Z6838 Body mass index (BMI) 38.0-38.9, adult: Secondary | ICD-10-CM | POA: Diagnosis not present

## 2019-09-20 DIAGNOSIS — R609 Edema, unspecified: Secondary | ICD-10-CM

## 2019-09-20 MED ORDER — HYDROCHLOROTHIAZIDE 25 MG PO TABS: 25.0000 mg | ORAL_TABLET | Freq: Every day | ORAL | 0 refills | Status: DC

## 2019-09-20 NOTE — Progress Notes (Signed)
Chief Complaint:   OBESITY Wanda Peters is here to discuss her progress with her obesity treatment plan along with follow-up of her obesity related diagnoses. Wanda Peters is on following a lower carbohydrate, vegetable and lean protein rich diet plan and states she is following her eating plan approximately 75% of the time. Tsuneko states she is walking for 45 minutes 3-4 times per week.  Today's visit was #: 63 Starting weight: 281 lbs Starting date: 08/26/2017 Today's weight: 254 lbs Today's date: 09/20/2019 Total lbs lost to date: 27 Total lbs lost since last in-office visit: 0  Interim History: Palyn is planning to have sleeve gastrectomy by Dr. Ok Anis on Oct 10, 2019. She would like to continue to come to our clinic after her surgery. She is off Saxenda at this point. She cannot lose weight and still qualify for surgery. She must be > 40 BMI.  Subjective:   1. Peripheral edema Wanda Peters reports peripheral edema is well controlled with HCTZ.  2. Other depression, with emotional eating  Wanda Peters reports her stress eating is well controlled. She is weaning off of bupropion and taking 150 mg QOD. Mood is stable.   Assessment/Plan:   1. Peripheral edema We will refill hydrochlorothiazide 25 mg daily for 1 month, and will follow up.  - hydrochlorothiazide (HYDRODIURIL) 25 MG tablet; Take 1 tablet (25 mg total) by mouth daily.  Dispense: 30 tablet; Refill: 0  2. Other depression, with emotional eating   She will take 75 mg bupropion QOD for one week and then discontinue. She will continue Lexapro.   3. Class 2 severe obesity with serious comorbidity and body mass index (BMI) of 38.0 to 38.9 in adult, unspecified obesity type Wanda Peters) Wanda Peters is currently in the action stage of change. As such, her goal is to continue with weight loss efforts. She has agreed to practicing portion control and making smarter food choices, such as increasing vegetables and decreasing simple  carbohydrates.   We will discontinue Saxenda.  Exercise goals: No exercise has been prescribed at this time.  Behavioral modification strategies: increasing lean protein intake.  Wanda Peters has agreed to follow-up with our clinic in 5 weeks. She was informed of the importance of frequent follow-up visits to maximize her success with intensive lifestyle modifications for her multiple health conditions.   Objective:   Blood pressure 95/67, pulse 61, temperature 98.3 F (36.8 C), temperature source Oral, height 5\' 6"  (1.676 m), weight 254 lb (115.2 kg), last menstrual period 09/18/2019, SpO2 97 %. Body mass index is 41 kg/m.  General: Cooperative, alert, well developed, in no acute distress. HEENT: Conjunctivae and lids unremarkable. Cardiovascular: Regular rhythm.  Lungs: Normal work of breathing. Neurologic: No focal deficits.   Lab Results  Component Value Date   CREATININE 0.92 05/23/2019   BUN 14 05/23/2019   NA 138 05/23/2019   K 4.2 05/23/2019   CL 103 05/23/2019   CO2 21 05/23/2019   Lab Results  Component Value Date   ALT 17 05/23/2019   AST 13 05/23/2019   ALKPHOS 85 05/23/2019   BILITOT 0.3 05/23/2019   Lab Results  Component Value Date   HGBA1C 5.1 05/23/2019   HGBA1C 5.4 09/30/2018   HGBA1C 5.4 04/19/2018   HGBA1C 5.3 12/21/2017   HGBA1C 5.5 08/26/2017   Lab Results  Component Value Date   INSULIN 7.6 05/23/2019   INSULIN 7.9 04/19/2018   INSULIN 8.6 12/21/2017   INSULIN 10.3 08/26/2017   Lab Results  Component Value Date  TSH 4.03 07/14/2017   Lab Results  Component Value Date   CHOL 172 05/23/2019   HDL 50 05/23/2019   LDLCALC 107 (H) 05/23/2019   TRIG 83 05/23/2019   CHOLHDL 3.4 09/30/2018   Lab Results  Component Value Date   WBC 7.5 04/19/2018   HGB 13.8 04/19/2018   HCT 41.4 04/19/2018   MCV 95 04/19/2018   PLT 262 04/19/2018   No results found for: IRON, TIBC, FERRITIN  Attestation Statements:   Reviewed by clinician on  day of visit: allergies, medications, problem list, medical history, surgical history, family history, social history, and previous encounter notes.   Wilhemena Durie, am acting as Location manager for [employer], FNP-C.  I have reviewed the above documentation for accuracy and completeness, and I agree with the above. -  Georgianne Fick, FNP

## 2019-09-23 MED FILL — HYDROCHLOROTHIAZIDE 25 MG T: 25 | 30 days supply | Qty: 30 | Fill #0

## 2019-09-30 ENCOUNTER — Ambulatory Visit: Payer: Self-pay | Admitting: General Surgery

## 2019-09-30 NOTE — Patient Instructions (Addendum)
DUE TO COVID-19 ONLY ONE VISITOR IS ALLOWED TO COME WITH YOU AND STAY IN THE WAITING ROOM ONLY DURING PRE OP AND PROCEDURE DAY OF SURGERY. THE 2 VISITORS MAY VISIT WITH YOU AFTER SURGERY IN YOUR PRIVATE ROOM DURING VISITING HOURS ONLY!  YOU NEED TO HAVE A COVID 19 TEST ON_4/29______ @__2 :50_____, THIS TEST MUST BE DONE BEFORE SURGERY, COME  [address], [place] , [address].  ([place]) ONCE YOUR COVID TEST IS COMPLETED, PLEASE BEGIN THE QUARANTINE INSTRUCTIONS AS OUTLINED IN YOUR HANDOUT.                Wanda Peters    Your procedure is scheduled on: 10/10/19   Report to [facility] Main  Entrance   Report to admitting at  7:30 AM     Call this number if you have problems the morning of surgery [place], NO CHEWING GUM [place].    NO SOLID FOOD AFTER 6:00 PM THE NIGHT BEFORE YOUR SURGERY.   YOU MAY DRINK CLEAR FLUIDS UNTIL 6:30 AM.  AT  6:30 DRINK THE G2  DRINK ALL AT ONE TIME THEN NOTHING BY MOUTH.  PAIN IS EXPECTED AFTER SURGERY AND WILL NOT BE COMPLETELY ELIMINATED.   AMBULATION AND TYLENOL WILL HELP REDUCE INCISIONAL AND GAS PAIN. MOVEMENT IS KEY!  YOU ARE EXPECTED TO BE OUT OF BED WITHIN 4 HOURS OF ADMISSION TO YOUR PATIENT ROOM.  SITTING IN THE RECLINER THROUGHOUT THE DAY IS IMPORTANT FOR DRINKING FLUIDS AND MOVING GAS THROUGHOUT THE GI TRACT.  COMPRESSION STOCKINGS SHOULD BE WORN [place] UNLESS YOU ARE WALKING.   INCENTIVE SPIROMETER SHOULD BE USED EVERY HOUR WHILE AWAKE TO DECREASE POST-OPERATIVE COMPLICATIONS SUCH AS PNEUMONIA.  WHEN DISCHARGED HOME, IT IS IMPORTANT TO CONTINUE TO WALK EVERY HOUR AND USE THE INCENTIVE SPIROMETER EVERY HOUR.    Take these medicines the morning of surgery with A SIP OF WATER:  Lexapro, Allegra, Flonase  DO NOT TAKE ANY DIABETIC MEDICATIONS DAY OF YOUR SURGERY                               You  may not have any metal on your body including hair pins and              Piercings.             Do not wear jewelry, make-up, lotions, powders or perfumes, deodorant             Do not wear nail polish on your fingernails.  Do not shave  48 hours prior to surgery.               Do not bring valuables to the hospital. [place].  Contacts, dentures or bridgework may not be worn into surgery.        Name and phone number of your driver:  Special Instructions: N/A              Please read over the following fact sheets you were given: _____________________________________________________________________             [facility] - Preparing for Surgery Before surgery, you can play an important role.   Because skin is not sterile,  your skin needs to be as free of germs as possible .  You can reduce the number of germs on your skin by washing with CHG (chlorahexidine gluconate) soap before surgery.   CHG is an antiseptic cleaner which kills germs and bonds with the skin to continue killing germs even after washing. Please DO NOT use if you have an allergy to CHG or antibacterial soaps.   If your skin becomes reddened/irritated stop using the CHG and inform your nurse when you arrive at Short Stay. Do not shave (including legs and underarms) for at least 48 hours prior to the first CHG shower.    Please follow these instructions carefully:  1.  Shower with CHG Soap the night before surgery and the  morning of Surgery.  2.  If you choose to wash your hair, wash your hair first as usual with your  normal  shampoo.  3.  After you shampoo, rinse your hair and body thoroughly to remove the  shampoo.                                        4.  Use CHG as you would any other liquid soap.  You can apply chg directly  to the skin and wash                       Gently with a scrungie or clean washcloth.  5.  Apply the CHG Soap to your body ONLY FROM  THE NECK DOWN.   Do not use on face/ open                           Wound or open sores. Avoid contact with eyes, ears mouth and genitals (private parts).                       Wash face,  Genitals (private parts) with your normal soap.             6.  Wash thoroughly, paying special attention to the area where your surgery  will be performed.  7.  Thoroughly rinse your body with warm water from the neck down.  8.  DO NOT shower/wash with your normal soap after using and rinsing off  the CHG Soap.             9.  Pat yourself dry with a clean towel.            10.  Wear clean pajamas.            11.  Place clean sheets on your bed the night of your first shower and do not  sleep with pets. Day of Surgery : Do not apply any lotions/deodorants the morning of surgery.  Please wear clean clothes to the hospital/surgery center.      Incentive Spirometer  An incentive spirometer is a tool that can help keep your lungs clear and active. This tool measures how well you are filling your lungs with each breath. Taking long deep breaths may help reverse or decrease the chance of developing breathing (pulmonary) problems (especially infection) following:  A long period of time when you are unable to move or be active. BEFORE THE PROCEDURE   If the spirometer includes an indicator to show your best effort, your nurse or  respiratory therapist will set it to a desired goal.  If possible, sit up straight or lean slightly forward. Try not to slouch.  Hold the incentive spirometer in an upright position. INSTRUCTIONS FOR USE  1. Sit on the edge of your bed if possible, or sit up as far as you can in bed or on a chair. 2. Hold the incentive spirometer in an upright position. 3. Breathe out normally. 4. Place the mouthpiece in your mouth and seal your lips tightly around it. 5. Breathe in slowly and as deeply as possible, raising the piston or the ball toward the top of the column. 6. Hold your breath  for 3-5 seconds or for as long as possible. Allow the piston or ball to fall to the bottom of the column. 7. Remove the mouthpiece from your mouth and breathe out normally. 8. Rest for a few seconds and repeat Steps 1 through 7 at least 10 times every 1-2 hours when you are awake. Take your time and take a few normal breaths between deep breaths. 9. The spirometer may include an indicator to show your best effort. Use the indicator as a goal to work toward during each repetition. 10. After each set of 10 deep breaths, practice coughing to be sure your lungs are clear. If you have an incision (the cut made at the time of surgery), support your incision when coughing by placing a pillow or rolled up towels firmly against it. Once you are able to get out of bed, walk around indoors and cough well. You may stop using the incentive spirometer when instructed by your caregiver.  RISKS AND COMPLICATIONS  Take your time so you do not get dizzy or light-headed.  If you are in pain, you may need to take or ask for pain medication before doing incentive spirometry. It is harder to take a deep breath if you are having pain. AFTER USE  Rest and breathe slowly and easily.  It can be helpful to keep track of a log of your progress. Your caregiver can provide you with a simple table to help with this. If you are using the spirometer at home, follow these instructions: [place] IF:   You are having difficultly using the spirometer.  You have trouble using the spirometer as often as instructed.  Your pain medication is not giving enough relief while using the spirometer.  You develop fever of 100.5 F (38.1 C) or higher. SEEK IMMEDIATE MEDICAL CARE IF:   You cough up bloody sputum that had not been present before.  You develop fever of 102 F (38.9 C) or greater.  You develop worsening pain at or near the incision site. MAKE SURE YOU:   Understand these instructions.  Will watch your  condition.  Will get help right away if you are not doing well or get worse. Document Released: 10/06/2006 Document Revised: 08/18/2011 Document Reviewed: 12/07/2006 ExitCare Patient Information 2014 [place].   ________________________________________________________________________   FAILURE TO FOLLOW THESE INSTRUCTIONS MAY RESULT IN THE CANCELLATION OF YOUR SURGERY PATIENT SIGNATURE_________________________________  NURSE SIGNATURE__________________________________  ________________________________________________________________________

## 2019-10-03 ENCOUNTER — Encounter (HOSPITAL_COMMUNITY)
Admission: RE | Admit: 2019-10-03 | Discharge: 2019-10-03 | Disposition: A | Discharge status: Home / Self Care | Payer: 59 | Source: Ambulatory Visit | Attending: General Surgery | Admitting: General Surgery

## 2019-10-03 ENCOUNTER — Other Ambulatory Visit: Payer: Self-pay

## 2019-10-03 ENCOUNTER — Encounter (HOSPITAL_COMMUNITY): Payer: Self-pay

## 2019-10-03 DIAGNOSIS — Z01812 Encounter for preprocedural laboratory examination: Secondary | ICD-10-CM | POA: Diagnosis not present

## 2019-10-03 MED FILL — ESCITALOPRAM 10 MG TABLET: 10 | 30 days supply | Qty: 30 | Fill #0

## 2019-10-03 MED FILL — HYDROCHLOROTHIAZIDE 25 MG T: 25 | 30 days supply | Qty: 30 | Fill #0

## 2019-10-03 NOTE — Progress Notes (Signed)
PCP - Dr. Lucilla Lame Cardiologist - none  Chest x-ray - 09/06/19 EKG - 08/27/19 Stress Test - no ECHO - no Cardiac Cath - no  Sleep Study - NA CPAP -   Fasting Blood Sugar - NA Checks Blood Sugar _____ times a day  Blood Thinner Instructions:NA Aspirin Instructions: Last Dose:  Anesthesia review:   Patient denies shortness of breath, fever, cough and chest pain at PAT appointment Yes  Patient verbalized understanding of instructions that were given to them at the PAT appointment. Patient was also instructed that they will need to review over the PAT instructions again at home before surgery. Yes

## 2019-10-04 ENCOUNTER — Encounter (HOSPITAL_COMMUNITY)
Admission: RE | Admit: 2019-10-04 | Discharge: 2019-10-04 | Disposition: A | Discharge status: Home / Self Care | Payer: 59 | Source: Ambulatory Visit | Attending: General Surgery | Admitting: General Surgery

## 2019-10-04 DIAGNOSIS — Z01812 Encounter for preprocedural laboratory examination: Secondary | ICD-10-CM | POA: Diagnosis not present

## 2019-10-04 LAB — COMPREHENSIVE METABOLIC PANEL
ALT: 26 U/L (ref 0–44)
AST: 23 U/L (ref 15–41)
Albumin: 4 g/dL (ref 3.5–5.0)
Alkaline Phosphatase: 83 U/L (ref 38–126)
Anion gap: 9 (ref 5–15)
BUN: 22 mg/dL — ABNORMAL HIGH (ref 6–20)
CO2: 25 mmol/L (ref 22–32)
Calcium: 9 mg/dL (ref 8.9–10.3)
Chloride: 105 mmol/L (ref 98–111)
Creatinine, Ser: 0.61 mg/dL (ref 0.44–1.00)
GFR calc Af Amer: >60 mL/min (ref >60)
GFR calc non Af Amer: >60 mL/min (ref >60)
Glucose, Bld: 86 mg/dL (ref 70–99)
Potassium: 3.9 mmol/L (ref 3.5–5.1)
Sodium: 139 mmol/L (ref 135–145)
Total Bilirubin: 0.6 mg/dL (ref 0.3–1.2)
Total Protein: 7.1 g/dL (ref 6.5–8.1)

## 2019-10-04 LAB — CBC WITH DIFFERENTIAL/PLATELET
Abs Immature Granulocytes: 0.02 10*3/uL (ref 0.00–0.07)
Basophils Absolute: 0 10*3/uL (ref 0.0–0.1)
Basophils Relative: 1 %
Eosinophils Absolute: 0.1 10*3/uL (ref 0.0–0.5)
Eosinophils Relative: 2 %
HCT: 40.5 % (ref 36.0–46.0)
Hemoglobin: 13.3 g/dL (ref 12.0–15.0)
Immature Granulocytes: 0 %
Lymphocytes Relative: 29 %
Lymphs Abs: 1.7 10*3/uL (ref 0.7–4.0)
MCH: 31.7 pg (ref 26.0–34.0)
MCHC: 32.8 g/dL (ref 30.0–36.0)
MCV: 96.7 fL (ref 80.0–100.0)
Monocytes Absolute: 0.5 10*3/uL (ref 0.1–1.0)
Monocytes Relative: 8 %
Neutro Abs: 3.5 10*3/uL (ref 1.7–7.7)
Neutrophils Relative %: 60 %
Platelets: 247 10*3/uL (ref 150–400)
RBC: 4.19 MIL/uL (ref 3.87–5.11)
RDW: 12.3 % (ref 11.5–15.5)
WBC: 5.9 10*3/uL (ref 4.0–10.5)
nRBC: 0 % (ref 0.0–0.2)

## 2019-10-04 LAB — ABO/RH: ABO/RH(D): O POS

## 2019-10-05 DIAGNOSIS — M25552 Pain in left hip: Secondary | ICD-10-CM | POA: Diagnosis not present

## 2019-10-05 DIAGNOSIS — K449 Diaphragmatic hernia without obstruction or gangrene: Secondary | ICD-10-CM | POA: Diagnosis not present

## 2019-10-05 DIAGNOSIS — M25551 Pain in right hip: Secondary | ICD-10-CM | POA: Diagnosis not present

## 2019-10-05 DIAGNOSIS — E7800 Pure hypercholesterolemia, unspecified: Secondary | ICD-10-CM | POA: Diagnosis not present

## 2019-10-05 DIAGNOSIS — G8929 Other chronic pain: Secondary | ICD-10-CM | POA: Diagnosis not present

## 2019-10-06 ENCOUNTER — Inpatient Hospital Stay (HOSPITAL_COMMUNITY): Admission: RE | Admit: 2019-10-06 | Payer: 59 | Source: Ambulatory Visit

## 2019-10-06 ENCOUNTER — Ambulatory Visit: Payer: Self-pay | Admitting: General Surgery

## 2019-10-06 NOTE — H&P (View-Only) (Signed)
Wanda Peters Appointment: 10/05/2019 12:00 PM Location: [facility] Patient #: [id number] DOB: 03/06/1976 Married / Language: English / Race: White Female  History of Present Illness (Rupal Childress M. Amorah Sebring MD; 10/06/2019 12:04 PM) The patient is a 44 year old female who presents with obesity. She comes in today for ongoing follow-up regarding her obesity, stress urinary incontinence, chronic bilateral pain and elevated LDL cholesterol. She has completed her bariatric surgery pathway. She has received psychological clearance. Her upper GI showed a small hiatal hernia. Chest x-ray was unremarkable. She has met with the dietitian twice. She denies any medical changes since we last met about a month ago. [facility]. No chest pain or chest pressure. Patient was of breath. No heartburn.  09/01/19 She is referred by Dr. Beasley to discuss weight loss surgery. She has struggled with her weight for many years. She completed our online seminar. She is interested in sleeve gastrectomy. She believes this is the best procedure for her. Despite numerous attempts for sustained weight loss she has been unsuccessful. She has tried Weight Watchers in the past, phentermine, as well as saxenda -all without any long-term success. Moreover she is been working with an obesity medicine specialist for the past 2 years.  Her comorbidities include stress urinary incontinence, chronic bilateral hip pain, elevated LDL cholesterol  She denies any chest pain, chest pressure, shortness of breath, orthopnea, paroxysmal nocturnal dyspnea, dyspnea on exertion, TIAs or amaurosis fugax, what clots or family history of blood clots. She takes HCTZ for some fluid retention in her legs. She had a low risk sleep apnea questionnaire. She denies any heartburn, reflux or indigestion. She has a daily bowel movement. She denies any melena or hematochezia. She had a remote history of chronic  UTIs but none in the past one to 2 years. She has some stress incontinence. She has chronic bilateral hip discomfort. She denies any migraines, blurry vision or headaches.  She denies any tobacco or drug use. She may have a glass a wine per week. 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  I reviewed some labs from May 23, 2019. Her lipid panel was normal with the exception of an elevated LDL level of 107, she had a normal comprehensive metabolic panel, normal A1c of 5.1 and a normal vitamin D level.   Problem List/Past Medical (Mikala Podoll M. Madilyn Cephas, MD; 10/06/2019 12:06 PM) CHRONIC HIP PAIN, BILATERAL (M25.551, M25.552) ELEVATED LDL CHOLESTEROL LEVEL (E78.00) SEVERE OBESITY (E66.01) I believe the patient meets weight loss surgery criteria. HIATAL HERNIA (K44.9)  Past Surgical History (Jaysie Benthall M. Anterio Scheel, MD; 10/06/2019 12:06 PM) No pertinent past surgical history  Diagnostic Studies History (Elynor Kallenberger M. Ledora Delker, MD; 10/06/2019 12:06 PM) Colonoscopy never Mammogram within last year Pap Smear 1-5 years ago  Allergies (Angela Holmes, CMA; 10/05/2019 12:12 PM) Sulfa 10 *OPHTHALMIC AGENTS* Allergies Reconciled  Medication History (Angela Holmes, CMA; 10/05/2019 12:13 PM) Escitalopram Oxalate (10MG Tablet, Oral) Active. Vitamin D (Ergocalciferol) (1.25 MG(50000 UT) Capsule, Oral) Active. Allegra Allergy (Oral) Specific strength unknown - Active. Flonase (50MCG/DOSE Inhaler, Nasal) Active. hydroCHLOROthiazide (25MG Tablet, Oral) Active. Medications Reconciled  Social History (Kassem Kibbe M. Desarie Feild, MD; 10/06/2019 12:06 PM) Alcohol use Occasional alcohol use. Caffeine use Coffee. No drug use Tobacco use Never smoker.  Family History (Rylin Saez M. Marine Lezotte, MD; 10/06/2019 12:06 PM) Breast Cancer Mother.  Pregnancy / Birth History (Rieley Hausman M. Jonisha Kindig, MD; 10/06/2019 12:06 PM) Age at menarche 11 years. Contraceptive History Oral contraceptives. Gravida 2 Length (months) of breastfeeding  3-6 Maternal age   21-25 Para 2 Regular periods  Other Problems (Clothilde Tippetts M. Boleslaus Holloway, MD; 10/06/2019 12:06 PM) No pertinent past medical history     Review of Systems (Camika Marsico M. Dicie Edelen MD; 10/06/2019 12:04 PM) General Present- Fatigue. Not Present- Appetite Loss, Chills, Fever, Night Sweats, Weight Gain and Weight Loss. Skin Not Present- Change in Wart/Mole, Dryness, Hives, Jaundice, New Lesions, Non-Healing Wounds, Rash and Ulcer. HEENT Present- Wears glasses/contact lenses. Not Present- Earache, Hearing Loss, Hoarseness, Nose Bleed, Oral Ulcers, Ringing in the Ears, Seasonal Allergies, Sinus Pain, Sore Throat, Visual Disturbances and Yellow Eyes. Respiratory Not Present- Bloody sputum, Chronic Cough, Difficulty Breathing, Snoring and Wheezing. Breast Not Present- Breast Mass, Breast Pain, Nipple Discharge and Skin Changes. Cardiovascular Not Present- Chest Pain, Difficulty Breathing Lying Down, Leg Cramps, Palpitations, Rapid Heart Rate, Shortness of Breath and Swelling of Extremities. Gastrointestinal Not Present- Abdominal Pain, Bloating, Bloody Stool, Change in Bowel Habits, Chronic diarrhea, Constipation, Difficulty Swallowing, Excessive gas, Gets full quickly at meals, Hemorrhoids, Indigestion, Nausea, Rectal Pain and Vomiting. Female Genitourinary Not Present- Frequency, Nocturia, Painful Urination, Pelvic Pain and Urgency. Musculoskeletal Not Present- Back Pain, Joint Pain, Joint Stiffness, Muscle Pain, Muscle Weakness and Swelling of Extremities. Neurological Not Present- Decreased Memory, Fainting, Headaches, Numbness, Seizures, Tingling, Tremor, Trouble walking and Weakness. Psychiatric Not Present- Anxiety, Bipolar, Change in Sleep Pattern, Depression, Fearful and Frequent crying. Endocrine Present- Hot flashes. Not Present- Cold Intolerance, Excessive Hunger, Hair Changes, Heat Intolerance and New Diabetes. Hematology Not Present- Blood Thinners, Easy Bruising, Excessive bleeding,  Gland problems, HIV and Persistent Infections.  Vitals (Angela Holmes CMA; 10/05/2019 12:14 PM) 10/05/2019 12:13 PM Weight: 259 lb Height: 66in Body Surface Area: 2.23 m Body Mass Index: 41.8 kg/m  Temp.: 98F(Tympanic)  Pulse: 93 (Regular)  BP: 112/72(Sitting, Left Arm, Standard)        Physical Exam (Tiffany Talarico M. Delorus Langwell MD; 10/06/2019 12:04 PM)  General Mental Status-Alert. General Appearance-Consistent with stated age. Hydration-Well hydrated. Voice-Normal.  Head and Neck Head-normocephalic, atraumatic with no lesions or palpable masses. Trachea-midline. Thyroid Gland Characteristics - normal size and consistency.  Eye Eyeball - Bilateral-Extraocular movements intact. Sclera/Conjunctiva - Bilateral-No scleral icterus.  Chest and Lung Exam Chest and lung exam reveals -quiet, even and easy respiratory effort with no use of accessory muscles and on auscultation, normal breath sounds, no adventitious sounds and normal vocal resonance. Inspection Chest Wall - Normal. Back - normal.  Breast - Did not examine.  Cardiovascular Cardiovascular examination reveals -normal heart sounds, regular rate and rhythm with no murmurs and normal pedal pulses bilaterally.  Abdomen Inspection Inspection of the abdomen reveals - No Hernias. Skin - Scar - no surgical scars. Palpation/Percussion Palpation and Percussion of the abdomen reveal - Soft, Non Tender, No Rebound tenderness, No Rigidity (guarding) and No hepatosplenomegaly. Auscultation Auscultation of the abdomen reveals - Bowel sounds normal.  Peripheral Vascular Upper Extremity Palpation - Pulses bilaterally normal.  Neurologic Neurologic evaluation reveals -alert and oriented x 3 with no impairment of recent or remote memory. Mental Status-Normal.  Neuropsychiatric The patient's mood and affect are described as -normal. Judgment and Insight-insight is appropriate concerning matters  relevant to self.  Musculoskeletal Normal Exam - Left-Upper Extremity Strength Normal and Lower Extremity Strength Normal. Normal Exam - Right-Upper Extremity Strength Normal and Lower Extremity Strength Normal.  Lymphatic Head & Neck  General Head & Neck Lymphatics: Bilateral - Description - Normal. Axillary - Did not examine. Femoral & Inguinal - Did not examine.    Assessment & Plan (Shanterica Biehler M. Namiyah Grantham MD; 10/06/2019 12:06   PM)  SEVERE OBESITY (E66.01) Story: I believe the patient meets weight loss surgery criteria. Impression: The patient meets weight loss surgery criteria. I think the patient would be an acceptable candidate for Laparoscopic vertical sleeve gastrectomy.  We reviewed her bariatric surgery pathway and workup. We discussed the finding of a small sliding hiatal hernia. We discussed that we would tests for one intraoperatively. If found to have a significant sliding hiatal hernia I would proceed with repair. We discussed what that would involve. We did discuss again the possibility of harboring developing long-term after sleeve gastrectomy. We rediscussed the typical hospitalization. We rediscussed the typical issues that we see after surgery. We rediscussed the typical diet progression after surgery. All of her questions were asked and answered.  This patient encounter took 20 minutes today to perform the following: take history, perform exam, review outside records, interpret imaging, counsel the patient on their diagnosis  Current Plans Pt Education - EMW_preopbariatric  ELEVATED LDL CHOLESTEROL LEVEL (E78.00)   CHRONIC HIP PAIN, BILATERAL (M25.551)   HIATAL HERNIA (K44.9)  Leighton Ruff. Redmond Pulling, MD, FACS General, Bariatric, & Minimally Invasive Surgery [facility], [place]

## 2019-10-06 NOTE — H&P (Signed)
[name] Appointment: 10/05/2019 12:00 PM Location: [facility] Patient #: [id number] DOB: 11-10-1975 Married / Language: Cleophus Molt / Race: White Female  History of Present Illness Randall Hiss M. Wilson MD; 10/06/2019 12:04 PM) The patient is a 44 year old female who presents with obesity. She comes in today for ongoing follow-up regarding her obesity, stress urinary incontinence, chronic bilateral pain and elevated LDL cholesterol. She has completed her bariatric surgery pathway. She has received psychological clearance. Her upper GI showed a small hiatal hernia. Chest x-ray was unremarkable. She has met with the dietitian twice. She denies any medical changes since we last met about a month ago. [facility]. No chest pain or chest pressure. Patient was of breath. No heartburn.  09/01/19 She is referred by Dr. Leafy Ro to discuss weight loss surgery. She has struggled with her weight for many years. She completed our Neurosurgeon. She is interested in sleeve gastrectomy. She believes this is the best procedure for her. Despite numerous attempts for sustained weight loss she has been unsuccessful. She has tried Weight Watchers in the past, phentermine, as well as saxenda -all without any long-term success. Moreover she is been working with an obesity medicine specialist for the past 2 years.  Her comorbidities include stress urinary incontinence, chronic bilateral hip pain, elevated LDL cholesterol  She denies any chest pain, chest pressure, shortness of breath, orthopnea, paroxysmal nocturnal dyspnea, dyspnea on exertion, TIAs or amaurosis fugax, what clots or family history of blood clots. She takes HCTZ for some fluid retention in her legs. She had a low risk sleep apnea questionnaire. She denies any heartburn, reflux or indigestion. She has a daily bowel movement. She denies any melena or hematochezia. She had a remote history of chronic  UTIs but none in the past one to 2 years. She has some stress incontinence. She has chronic bilateral hip discomfort. She denies any migraines, blurry vision or headaches.  She denies any tobacco or drug use. She may have a glass a wine per week. She works for the [employer] system.  I reviewed some labs from May 23, 2019. Her lipid panel was normal with the exception of an elevated LDL level of 107, she had a normal comprehensive metabolic panel, normal T5V of 5.1 and a normal vitamin D level.   Problem List/Past Medical Randall Hiss M. Redmond Pulling, MD; 10/06/2019 12:06 PM) CHRONIC HIP PAIN, BILATERAL (M25.551, M25.552) ELEVATED LDL CHOLESTEROL LEVEL (E78.00) SEVERE OBESITY (E66.01) I believe the patient meets weight loss surgery criteria. HIATAL HERNIA (K44.9)  Past Surgical History Randall Hiss M. Redmond Pulling, MD; 10/06/2019 12:06 PM) No pertinent past surgical history  Diagnostic Studies History Randall Hiss M. Redmond Pulling, MD; 10/06/2019 12:06 PM) Colonoscopy never Mammogram within last year Pap Smear 1-5 years ago  Allergies Sabino Gasser, [place]; 10/05/2019 12:12 PM) Sulfa 10 *OPHTHALMIC AGENTS* Allergies Reconciled  Medication History Sabino Gasser, CMA; 10/05/2019 12:13 PM) Escitalopram Oxalate (10MG Tablet, Oral) Active. Vitamin D (Ergocalciferol) (1.25 MG(50000 UT) Capsule, Oral) Active. Allegra Allergy (Oral) Specific strength unknown - Active. Flonase (50MCG/DOSE Inhaler, Nasal) Active. hydroCHLOROthiazide (25MG Tablet, Oral) Active. Medications Reconciled  Social History Randall Hiss M. Redmond Pulling, MD; 10/06/2019 12:06 PM) Alcohol use Occasional alcohol use. Caffeine use Coffee. No drug use Tobacco use Never smoker.  Family History Randall Hiss M. Redmond Pulling, MD; 10/06/2019 12:06 PM) Breast Cancer Mother.  Pregnancy / Birth History Randall Hiss M. Redmond Pulling, MD; 10/06/2019 12:06 PM) Age at menarche 78 years. Contraceptive History Oral contraceptives. Gravida 2 Length (months) of breastfeeding  3-6 Maternal age  21-25 Para 2 Regular periods  Other Problems Randall Hiss M. Redmond Pulling, MD; 10/06/2019 12:06 PM) No pertinent past medical history     Review of Systems Randall Hiss M. Devory Mckinzie MD; 10/06/2019 12:04 PM) General Present- Fatigue. Not Present- Appetite Loss, Chills, Fever, Night Sweats, Weight Gain and Weight Loss. Skin Not Present- Change in Wart/Mole, Dryness, Hives, Jaundice, New Lesions, Non-Healing Wounds, Rash and Ulcer. HEENT Present- Wears glasses/contact lenses. Not Present- Earache, Hearing Loss, Hoarseness, Nose Bleed, Oral Ulcers, Ringing in the Ears, Seasonal Allergies, Sinus Pain, Sore Throat, Visual Disturbances and Yellow Eyes. Respiratory Not Present- Bloody sputum, Chronic Cough, Difficulty Breathing, Snoring and Wheezing. Breast Not Present- Breast Mass, Breast Pain, Nipple Discharge and Skin Changes. Cardiovascular Not Present- Chest Pain, Difficulty Breathing Lying Down, Leg Cramps, Palpitations, Rapid Heart Rate, Shortness of Breath and Swelling of Extremities. Gastrointestinal Not Present- Abdominal Pain, Bloating, Bloody Stool, Change in Bowel Habits, Chronic diarrhea, Constipation, Difficulty Swallowing, Excessive gas, Gets full quickly at meals, Hemorrhoids, Indigestion, Nausea, Rectal Pain and Vomiting. Female Genitourinary Not Present- Frequency, Nocturia, Painful Urination, Pelvic Pain and Urgency. Musculoskeletal Not Present- Back Pain, Joint Pain, Joint Stiffness, Muscle Pain, Muscle Weakness and Swelling of Extremities. Neurological Not Present- Decreased Memory, Fainting, Headaches, Numbness, Seizures, Tingling, Tremor, Trouble walking and Weakness. Psychiatric Not Present- Anxiety, Bipolar, Change in Sleep Pattern, Depression, Fearful and Frequent crying. Endocrine Present- Hot flashes. Not Present- Cold Intolerance, Excessive Hunger, Hair Changes, Heat Intolerance and New Diabetes. Hematology Not Present- Blood Thinners, Easy Bruising, Excessive bleeding,  Gland problems, HIV and Persistent Infections.  Vitals Sabino Gasser CMA; 10/05/2019 12:14 PM) 10/05/2019 12:13 PM Weight: 259 lb Height: 66in Body Surface Area: 2.23 m Body Mass Index: 41.8 kg/m  Temp.: 70F(Tympanic)  Pulse: 93 (Regular)  BP: 112/72(Sitting, Left Arm, Standard)        Physical Exam Randall Hiss M. Jillayne Witte MD; 10/06/2019 12:04 PM)  General Mental Status-Alert. General Appearance-Consistent with stated age. Hydration-Well hydrated. Voice-Normal.  Head and Neck Head-normocephalic, atraumatic with no lesions or palpable masses. Trachea-midline. Thyroid Gland Characteristics - normal size and consistency.  Eye Eyeball - Bilateral-Extraocular movements intact. Sclera/Conjunctiva - Bilateral-No scleral icterus.  Chest and Lung Exam Chest and lung exam reveals -quiet, even and easy respiratory effort with no use of accessory muscles and on auscultation, normal breath sounds, no adventitious sounds and normal vocal resonance. Inspection Chest Wall - Normal. Back - normal.  Breast - Did not examine.  Cardiovascular Cardiovascular examination reveals -normal heart sounds, regular rate and rhythm with no murmurs and normal pedal pulses bilaterally.  Abdomen Inspection Inspection of the abdomen reveals - No Hernias. Skin - Scar - no surgical scars. Palpation/Percussion Palpation and Percussion of the abdomen reveal - Soft, Non Tender, No Rebound tenderness, No Rigidity (guarding) and No hepatosplenomegaly. Auscultation Auscultation of the abdomen reveals - Bowel sounds normal.  Peripheral Vascular Upper Extremity Palpation - Pulses bilaterally normal.  Neurologic Neurologic evaluation reveals -alert and oriented x 3 with no impairment of recent or remote memory. Mental Status-Normal.  Neuropsychiatric The patient's mood and affect are described as -normal. Judgment and Insight-insight is appropriate concerning matters  relevant to self.  Musculoskeletal Normal Exam - Left-Upper Extremity Strength Normal and Lower Extremity Strength Normal. Normal Exam - Right-Upper Extremity Strength Normal and Lower Extremity Strength Normal.  Lymphatic Head & Neck  General Head & Neck Lymphatics: Bilateral - Description - Normal. Axillary - Did not examine. Femoral & Inguinal - Did not examine.    Assessment & Plan Randall Hiss M. Rosamaria Donn MD; 10/06/2019 12:06  PM)  SEVERE OBESITY (E66.01) Story: I believe the patient meets weight loss surgery criteria. Impression: The patient meets weight loss surgery criteria. I think the patient would be an acceptable candidate for Laparoscopic vertical sleeve gastrectomy.  We reviewed her bariatric surgery pathway and workup. We discussed the finding of a small sliding hiatal hernia. We discussed that we would tests for one intraoperatively. If found to have a significant sliding hiatal hernia I would proceed with repair. We discussed what that would involve. We did discuss again the possibility of harboring developing long-term after sleeve gastrectomy. We rediscussed the typical hospitalization. We rediscussed the typical issues that we see after surgery. We rediscussed the typical diet progression after surgery. All of her questions were asked and answered.  This patient encounter took 20 minutes today to perform the following: take history, perform exam, review outside records, interpret imaging, counsel the patient on their diagnosis  Current Plans Pt Education - EMW_preopbariatric  ELEVATED LDL CHOLESTEROL LEVEL (E78.00)   CHRONIC HIP PAIN, BILATERAL (M25.551)   HIATAL HERNIA (K44.9)  Leighton Ruff. Redmond Pulling, MD, FACS General, Bariatric, & Minimally Invasive Surgery [facility], [place]

## 2019-10-07 ENCOUNTER — Other Ambulatory Visit (HOSPITAL_COMMUNITY)
Admission: RE | Admit: 2019-10-07 | Discharge: 2019-10-07 | Disposition: A | Discharge status: Home / Self Care | Payer: 59 | Source: Ambulatory Visit | Attending: General Surgery | Admitting: General Surgery

## 2019-10-07 DIAGNOSIS — G8929 Other chronic pain: Secondary | ICD-10-CM | POA: Diagnosis not present

## 2019-10-07 DIAGNOSIS — Z6841 Body Mass Index (BMI) 40.0 and over, adult: Secondary | ICD-10-CM | POA: Diagnosis not present

## 2019-10-07 DIAGNOSIS — Z20822 Contact with and (suspected) exposure to covid-19: Secondary | ICD-10-CM | POA: Diagnosis not present

## 2019-10-07 DIAGNOSIS — Z01812 Encounter for preprocedural laboratory examination: Secondary | ICD-10-CM | POA: Insufficient documentation

## 2019-10-07 DIAGNOSIS — K449 Diaphragmatic hernia without obstruction or gangrene: Secondary | ICD-10-CM | POA: Diagnosis not present

## 2019-10-07 DIAGNOSIS — M25552 Pain in left hip: Secondary | ICD-10-CM | POA: Diagnosis not present

## 2019-10-07 DIAGNOSIS — N393 Stress incontinence (female) (male): Secondary | ICD-10-CM | POA: Diagnosis not present

## 2019-10-07 DIAGNOSIS — S36893A Laceration of other intra-abdominal organs, initial encounter: Secondary | ICD-10-CM | POA: Diagnosis not present

## 2019-10-07 DIAGNOSIS — M25551 Pain in right hip: Secondary | ICD-10-CM | POA: Diagnosis not present

## 2019-10-08 LAB — SARS CORONAVIRUS 2 (TAT 6-24 HRS): SARS Coronavirus 2: NEGATIVE

## 2019-10-09 MED ORDER — BUPIVACAINE LIPOSOME 1.3 % IJ SUSP
20.0000 mL | Freq: Once | INTRAMUSCULAR | Status: DC
  Filled 2019-10-09: qty 20

## 2019-10-10 ENCOUNTER — Encounter (HOSPITAL_COMMUNITY)
Admission: RE | Disposition: A | Discharge status: Home / Self Care | Payer: Self-pay | Source: Home / Self Care | Attending: General Surgery

## 2019-10-10 ENCOUNTER — Inpatient Hospital Stay (HOSPITAL_COMMUNITY): Payer: 59 | Admitting: Certified Registered Nurse Anesthetist

## 2019-10-10 ENCOUNTER — Encounter (HOSPITAL_COMMUNITY): Payer: Self-pay | Admitting: General Surgery

## 2019-10-10 ENCOUNTER — Other Ambulatory Visit: Payer: Self-pay

## 2019-10-10 ENCOUNTER — Inpatient Hospital Stay (HOSPITAL_COMMUNITY)
Admission: RE | Admit: 2019-10-10 | Discharge: 2019-10-12 | DRG: 620 | Disposition: A | Discharge status: Home / Self Care | Payer: 59 | Attending: General Surgery | Admitting: General Surgery

## 2019-10-10 DIAGNOSIS — E7849 Other hyperlipidemia: Secondary | ICD-10-CM | POA: Diagnosis present

## 2019-10-10 DIAGNOSIS — M25551 Pain in right hip: Secondary | ICD-10-CM | POA: Diagnosis present

## 2019-10-10 DIAGNOSIS — M25552 Pain in left hip: Secondary | ICD-10-CM | POA: Diagnosis not present

## 2019-10-10 DIAGNOSIS — Z6841 Body Mass Index (BMI) 40.0 and over, adult: Secondary | ICD-10-CM

## 2019-10-10 DIAGNOSIS — S36893A Laceration of other intra-abdominal organs, initial encounter: Secondary | ICD-10-CM | POA: Diagnosis not present

## 2019-10-10 DIAGNOSIS — Y838 Other surgical procedures as the cause of abnormal reaction of the patient, or of later complication, without mention of misadventure at the time of the procedure: Secondary | ICD-10-CM | POA: Diagnosis not present

## 2019-10-10 DIAGNOSIS — K449 Diaphragmatic hernia without obstruction or gangrene: Secondary | ICD-10-CM | POA: Diagnosis not present

## 2019-10-10 DIAGNOSIS — E7800 Pure hypercholesterolemia, unspecified: Secondary | ICD-10-CM | POA: Diagnosis present

## 2019-10-10 DIAGNOSIS — N393 Stress incontinence (female) (male): Secondary | ICD-10-CM | POA: Diagnosis present

## 2019-10-10 DIAGNOSIS — Z20822 Contact with and (suspected) exposure to covid-19: Secondary | ICD-10-CM | POA: Diagnosis present

## 2019-10-10 DIAGNOSIS — S36899A Unspecified injury of other intra-abdominal organs, initial encounter: Secondary | ICD-10-CM | POA: Diagnosis not present

## 2019-10-10 DIAGNOSIS — E785 Hyperlipidemia, unspecified: Secondary | ICD-10-CM | POA: Diagnosis not present

## 2019-10-10 DIAGNOSIS — G8929 Other chronic pain: Secondary | ICD-10-CM | POA: Diagnosis present

## 2019-10-10 DIAGNOSIS — Z6833 Body mass index (BMI) 33.0-33.9, adult: Secondary | ICD-10-CM | POA: Diagnosis present

## 2019-10-10 DIAGNOSIS — E669 Obesity, unspecified: Secondary | ICD-10-CM | POA: Diagnosis present

## 2019-10-10 DIAGNOSIS — Y92234 Operating room of hospital as the place of occurrence of the external cause: Secondary | ICD-10-CM | POA: Diagnosis not present

## 2019-10-10 DIAGNOSIS — Z9884 Bariatric surgery status: Secondary | ICD-10-CM

## 2019-10-10 HISTORY — PX: LAPAROSCOPIC GASTRIC SLEEVE RESECTION: SHX5895

## 2019-10-10 LAB — CBC
HCT: 38 % (ref 36.0–46.0)
Hemoglobin: 12.1 g/dL (ref 12.0–15.0)
MCH: 31.6 pg (ref 26.0–34.0)
MCHC: 31.8 g/dL (ref 30.0–36.0)
MCV: 99.2 fL (ref 80.0–100.0)
Platelets: 191 10*3/uL (ref 150–400)
RBC: 3.83 MIL/uL — ABNORMAL LOW (ref 3.87–5.11)
RDW: 12.4 % (ref 11.5–15.5)
WBC: 10.4 10*3/uL (ref 4.0–10.5)
nRBC: 0 % (ref 0.0–0.2)

## 2019-10-10 LAB — HEMOGLOBIN AND HEMATOCRIT, BLOOD
HCT: 37.3 % (ref 36.0–46.0)
Hemoglobin: 12 g/dL (ref 12.0–15.0)

## 2019-10-10 LAB — PREGNANCY, URINE: Preg Test, Ur: NEGATIVE

## 2019-10-10 LAB — PREPARE RBC (CROSSMATCH)

## 2019-10-10 SURGERY — GASTRECTOMY, SLEEVE, LAPAROSCOPIC
Anesthesia: General | Site: Abdomen

## 2019-10-10 MED ORDER — SUCCINYLCHOLINE CHLORIDE 200 MG/10ML IV SOSY
PREFILLED_SYRINGE | INTRAVENOUS | Status: AC
  Filled 2019-10-10: qty 10

## 2019-10-10 MED ORDER — ONDANSETRON HCL 4 MG/2ML IJ SOLN
4.0000 mg | Freq: Four times a day (QID) | INTRAMUSCULAR | Status: DC | PRN
  Administered 2019-10-11 (×2): 4 mg via INTRAVENOUS
  Filled 2019-10-10 (×2): qty 2

## 2019-10-10 MED ORDER — FENTANYL CITRATE (PF) 250 MCG/5ML IJ SOLN
INTRAMUSCULAR | Status: AC
  Filled 2019-10-10: qty 5

## 2019-10-10 MED ORDER — PHENYLEPHRINE 40 MCG/ML (10ML) SYRINGE FOR IV PUSH (FOR BLOOD PRESSURE SUPPORT)
PREFILLED_SYRINGE | INTRAVENOUS | Status: DC | PRN
  Administered 2019-10-10: 120 ug via INTRAVENOUS
  Administered 2019-10-10: 80 ug via INTRAVENOUS

## 2019-10-10 MED ORDER — OXYCODONE HCL 5 MG/5ML PO SOLN: 5.0000 mg | Freq: Four times a day (QID) | ORAL | Status: DC | PRN

## 2019-10-10 MED ORDER — ESCITALOPRAM OXALATE 10 MG PO TABS
10.0000 mg | ORAL_TABLET | Freq: Every day | ORAL | Status: DC
  Administered 2019-10-10 – 2019-10-11 (×2): 10 mg via ORAL
  Filled 2019-10-10 (×2): qty 1

## 2019-10-10 MED ORDER — MIDAZOLAM HCL 2 MG/2ML IJ SOLN
INTRAMUSCULAR | Status: AC
  Filled 2019-10-10: qty 2

## 2019-10-10 MED ORDER — LACTATED RINGERS IR SOLN
Status: DC | PRN
  Administered 2019-10-10: 1000 mL

## 2019-10-10 MED ORDER — ENSURE MAX PROTEIN PO LIQD
2.0000 [oz_av] | ORAL | Status: DC
  Administered 2019-10-11 – 2019-10-12 (×4): 2 [oz_av] via ORAL

## 2019-10-10 MED ORDER — PROPOFOL 10 MG/ML IV BOLUS
INTRAVENOUS | Status: AC
  Filled 2019-10-10: qty 20

## 2019-10-10 MED ORDER — ACETAMINOPHEN 500 MG PO TABS
1000.0000 mg | ORAL_TABLET | ORAL | Status: AC
  Administered 2019-10-10: 1000 mg via ORAL
  Filled 2019-10-10: qty 2

## 2019-10-10 MED ORDER — ROCURONIUM BROMIDE 10 MG/ML (PF) SYRINGE
PREFILLED_SYRINGE | INTRAVENOUS | Status: AC
  Filled 2019-10-10: qty 20

## 2019-10-10 MED ORDER — ACETAMINOPHEN 500 MG PO TABS
1000.0000 mg | ORAL_TABLET | Freq: Three times a day (TID) | ORAL | Status: DC
  Administered 2019-10-10 – 2019-10-12 (×5): 1000 mg via ORAL
  Filled 2019-10-10 (×5): qty 2

## 2019-10-10 MED ORDER — DEXAMETHASONE SODIUM PHOSPHATE 10 MG/ML IJ SOLN
INTRAMUSCULAR | Status: DC | PRN
  Administered 2019-10-10: 10 mg via INTRAVENOUS

## 2019-10-10 MED ORDER — KCL IN DEXTROSE-NACL 20-5-0.45 MEQ/L-%-% IV SOLN
INTRAVENOUS | Status: DC
  Filled 2019-10-10 (×5): qty 1000

## 2019-10-10 MED ORDER — SODIUM CHLORIDE (PF) 0.9 % IJ SOLN
INTRAMUSCULAR | Status: DC | PRN
  Administered 2019-10-10: 50 mL

## 2019-10-10 MED ORDER — SUGAMMADEX SODIUM 200 MG/2ML IV SOLN
INTRAVENOUS | Status: DC | PRN
  Administered 2019-10-10: 400 mg via INTRAVENOUS

## 2019-10-10 MED ORDER — SODIUM CHLORIDE (PF) 0.9 % IJ SOLN
INTRAMUSCULAR | Status: AC
  Filled 2019-10-10: qty 50

## 2019-10-10 MED ORDER — SIMETHICONE 80 MG PO CHEW
80.0000 mg | CHEWABLE_TABLET | Freq: Four times a day (QID) | ORAL | Status: DC | PRN
  Administered 2019-10-11: 80 mg via ORAL
  Filled 2019-10-10: qty 1

## 2019-10-10 MED ORDER — SODIUM CHLORIDE 0.9 % IV SOLN
2.0000 g | INTRAVENOUS | Status: AC
  Administered 2019-10-10: 2 g via INTRAVENOUS
  Filled 2019-10-10: qty 2

## 2019-10-10 MED ORDER — PROPOFOL 10 MG/ML IV BOLUS
INTRAVENOUS | Status: DC | PRN
  Administered 2019-10-10: 200 mg via INTRAVENOUS

## 2019-10-10 MED ORDER — PROMETHAZINE HCL 25 MG/ML IJ SOLN: 12.5000 mg | Freq: Four times a day (QID) | INTRAMUSCULAR | Status: DC | PRN

## 2019-10-10 MED ORDER — ACETAMINOPHEN 160 MG/5ML PO SOLN
1000.0000 mg | Freq: Three times a day (TID) | ORAL | Status: DC
  Filled 2019-10-10: qty 40.6

## 2019-10-10 MED ORDER — CHLORHEXIDINE GLUCONATE 4 % EX LIQD: 60.0000 mL | Freq: Once | CUTANEOUS | Status: DC

## 2019-10-10 MED ORDER — MIDAZOLAM HCL 5 MG/5ML IJ SOLN
INTRAMUSCULAR | Status: DC | PRN
  Administered 2019-10-10: 2 mg via INTRAVENOUS

## 2019-10-10 MED ORDER — DEXAMETHASONE SODIUM PHOSPHATE 10 MG/ML IJ SOLN
INTRAMUSCULAR | Status: AC
  Filled 2019-10-10: qty 1

## 2019-10-10 MED ORDER — GLYCOPYRROLATE PF 0.2 MG/ML IJ SOSY
PREFILLED_SYRINGE | INTRAMUSCULAR | Status: DC | PRN
  Administered 2019-10-10: .1 mg via INTRAVENOUS

## 2019-10-10 MED ORDER — BUPIVACAINE LIPOSOME 1.3 % IJ SUSP
INTRAMUSCULAR | Status: DC | PRN
  Administered 2019-10-10: 20 mL

## 2019-10-10 MED ORDER — ONDANSETRON HCL 4 MG/2ML IJ SOLN
INTRAMUSCULAR | Status: AC
  Filled 2019-10-10: qty 2

## 2019-10-10 MED ORDER — SUGAMMADEX SODIUM 500 MG/5ML IV SOLN
INTRAVENOUS | Status: AC
  Filled 2019-10-10: qty 5

## 2019-10-10 MED ORDER — OXYCODONE HCL 5 MG/5ML PO SOLN: 5.0000 mg | Freq: Once | ORAL | Status: DC | PRN

## 2019-10-10 MED ORDER — SCOPOLAMINE 1 MG/3DAYS TD PT72
1.0000 | MEDICATED_PATCH | TRANSDERMAL | Status: DC
  Administered 2019-10-10: 1.5 mg via TRANSDERMAL
  Filled 2019-10-10: qty 1

## 2019-10-10 MED ORDER — ONDANSETRON HCL 4 MG/2ML IJ SOLN
INTRAMUSCULAR | Status: DC | PRN
  Administered 2019-10-10: 4 mg via INTRAVENOUS

## 2019-10-10 MED ORDER — GLYCOPYRROLATE PF 0.2 MG/ML IJ SOSY
PREFILLED_SYRINGE | INTRAMUSCULAR | Status: AC
  Filled 2019-10-10: qty 2

## 2019-10-10 MED ORDER — LACTATED RINGERS IV SOLN: INTRAVENOUS | Status: DC | PRN

## 2019-10-10 MED ORDER — DIPHENHYDRAMINE HCL 50 MG/ML IJ SOLN: 12.5000 mg | Freq: Three times a day (TID) | INTRAMUSCULAR | Status: DC | PRN

## 2019-10-10 MED ORDER — PANTOPRAZOLE SODIUM 40 MG IV SOLR
40.0000 mg | Freq: Every day | INTRAVENOUS | Status: DC
  Administered 2019-10-10 – 2019-10-11 (×2): 40 mg via INTRAVENOUS
  Filled 2019-10-10 (×2): qty 40

## 2019-10-10 MED ORDER — LIDOCAINE 2% (20 MG/ML) 5 ML SYRINGE
INTRAMUSCULAR | Status: DC | PRN
  Administered 2019-10-10: 100 mg via INTRAVENOUS

## 2019-10-10 MED ORDER — LACTATED RINGERS IV SOLN: INTRAVENOUS | Status: DC

## 2019-10-10 MED ORDER — GABAPENTIN 300 MG PO CAPS
300.0000 mg | ORAL_CAPSULE | ORAL | Status: AC
  Administered 2019-10-10: 300 mg via ORAL
  Filled 2019-10-10: qty 1

## 2019-10-10 MED ORDER — APREPITANT 40 MG PO CAPS
40.0000 mg | ORAL_CAPSULE | ORAL | Status: AC
  Administered 2019-10-10: 40 mg via ORAL
  Filled 2019-10-10: qty 1

## 2019-10-10 MED ORDER — ROCURONIUM BROMIDE 10 MG/ML (PF) SYRINGE
PREFILLED_SYRINGE | INTRAVENOUS | Status: DC | PRN
  Administered 2019-10-10: 10 mg via INTRAVENOUS
  Administered 2019-10-10 (×2): 20 mg via INTRAVENOUS
  Administered 2019-10-10: 80 mg via INTRAVENOUS

## 2019-10-10 MED ORDER — GABAPENTIN 100 MG PO CAPS
200.0000 mg | ORAL_CAPSULE | Freq: Two times a day (BID) | ORAL | Status: DC
  Administered 2019-10-10 – 2019-10-12 (×4): 200 mg via ORAL
  Filled 2019-10-10 (×4): qty 2

## 2019-10-10 MED ORDER — MORPHINE SULFATE (PF) 2 MG/ML IV SOLN: 1.0000 mg | INTRAVENOUS | Status: DC | PRN

## 2019-10-10 MED ORDER — PHENYLEPHRINE 40 MCG/ML (10ML) SYRINGE FOR IV PUSH (FOR BLOOD PRESSURE SUPPORT)
PREFILLED_SYRINGE | INTRAVENOUS | Status: AC
  Filled 2019-10-10: qty 20

## 2019-10-10 MED ORDER — LIDOCAINE 20MG/ML (2%) 15 ML SYRINGE OPTIME
INTRAMUSCULAR | Status: DC | PRN
Start: 2019-10-10 — End: 2019-10-10
  Administered 2019-10-10: 1 mg/kg/h via INTRAVENOUS

## 2019-10-10 MED ORDER — 0.9 % SODIUM CHLORIDE (POUR BTL) OPTIME
TOPICAL | Status: DC | PRN
  Administered 2019-10-10: 1000 mL

## 2019-10-10 MED ORDER — FENTANYL CITRATE (PF) 100 MCG/2ML IJ SOLN: 25.0000 ug | INTRAMUSCULAR | Status: DC | PRN

## 2019-10-10 MED ORDER — STERILE WATER FOR IRRIGATION IR SOLN
Status: DC | PRN
  Administered 2019-10-10: 2000 mL

## 2019-10-10 MED ORDER — HEPARIN SODIUM (PORCINE) 5000 UNIT/ML IJ SOLN
5000.0000 [IU] | INTRAMUSCULAR | Status: AC
  Administered 2019-10-10: 5000 [IU] via SUBCUTANEOUS
  Filled 2019-10-10: qty 1

## 2019-10-10 MED ORDER — ONDANSETRON HCL 4 MG/2ML IJ SOLN: 4.0000 mg | Freq: Once | INTRAMUSCULAR | Status: DC | PRN

## 2019-10-10 MED ORDER — FENTANYL CITRATE (PF) 250 MCG/5ML IJ SOLN
INTRAMUSCULAR | Status: DC | PRN
  Administered 2019-10-10: 50 ug via INTRAVENOUS
  Administered 2019-10-10: 100 ug via INTRAVENOUS

## 2019-10-10 MED ORDER — EPHEDRINE SULFATE-NACL 50-0.9 MG/10ML-% IV SOSY
PREFILLED_SYRINGE | INTRAVENOUS | Status: DC | PRN
  Administered 2019-10-10 (×5): 10 mg via INTRAVENOUS

## 2019-10-10 MED ORDER — OXYCODONE HCL 5 MG PO TABS: 5.0000 mg | ORAL_TABLET | Freq: Once | ORAL | Status: DC | PRN

## 2019-10-10 MED ORDER — ROCURONIUM BROMIDE 10 MG/ML (PF) SYRINGE
PREFILLED_SYRINGE | INTRAVENOUS | Status: AC
  Filled 2019-10-10: qty 10

## 2019-10-10 MED ORDER — DEXAMETHASONE SODIUM PHOSPHATE 4 MG/ML IJ SOLN: 4.0000 mg | INTRAMUSCULAR | Status: DC

## 2019-10-10 MED ORDER — LIDOCAINE 2% (20 MG/ML) 5 ML SYRINGE
INTRAMUSCULAR | Status: AC
  Filled 2019-10-10: qty 5

## 2019-10-10 SURGICAL SUPPLY — 90 items
APL PRP STRL LF DISP 70% ISPRP (MISCELLANEOUS) ×2
APL SKNCLS STERI-STRIP NONHPOA (GAUZE/BANDAGES/DRESSINGS) ×1
APL SRG 32X5 SNPLK LF DISP (MISCELLANEOUS)
APL SWBSTK 6 STRL LF DISP (MISCELLANEOUS)
APPLICATOR COTTON TIP 6 STRL (MISCELLANEOUS) IMPLANT
APPLICATOR COTTON TIP 6IN STRL (MISCELLANEOUS)
APPLIER CLIP ROT 10 11.4 M/L (STAPLE)
APPLIER CLIP ROT 13.4 12 LRG (CLIP)
APR CLP LRG 13.4X12 ROT 20 MLT (CLIP)
APR CLP MED LRG 11.4X10 (STAPLE)
BENZOIN TINCTURE PRP APPL 2/3 (GAUZE/BANDAGES/DRESSINGS) ×2 IMPLANT
BLADE SURG SZ11 CARB STEEL (BLADE) ×2 IMPLANT
BNDG ADH 1X3 SHEER STRL LF (GAUZE/BANDAGES/DRESSINGS) ×12 IMPLANT
BNDG ADH THN 3X1 STRL LF (GAUZE/BANDAGES/DRESSINGS) ×6
CABLE HIGH FREQUENCY MONO STRZ (ELECTRODE) ×1 IMPLANT
CHLORAPREP W/TINT 26 (MISCELLANEOUS) ×4 IMPLANT
CLIP APPLIE ROT 10 11.4 M/L (STAPLE) IMPLANT
CLIP APPLIE ROT 13.4 12 LRG (CLIP) IMPLANT
COVER SURGICAL LIGHT HANDLE (MISCELLANEOUS) ×2 IMPLANT
COVER WAND RF STERILE (DRAPES) ×1 IMPLANT
DECANTER SPIKE VIAL GLASS SM (MISCELLANEOUS) ×2 IMPLANT
DEVICE SUT QUICK LOAD TK 5 (STAPLE) ×1 IMPLANT
DEVICE SUT TI-KNOT TK 5X26 (MISCELLANEOUS) ×1 IMPLANT
DEVICE SUTURE ENDOST 10MM (ENDOMECHANICALS) IMPLANT
DISSECTOR BLUNT TIP ENDO 5MM (MISCELLANEOUS) IMPLANT
DRAPE UTILITY XL STRL (DRAPES) ×4 IMPLANT
ELECT L-HOOK LAP 45CM DISP (ELECTROSURGICAL)
ELECT REM PT RETURN 15FT ADLT (MISCELLANEOUS) ×2 IMPLANT
ELECTRODE L-HOOK LAP 45CM DISP (ELECTROSURGICAL) IMPLANT
GAUZE SPONGE 2X2 8PLY STRL LF (GAUZE/BANDAGES/DRESSINGS) IMPLANT
GAUZE SPONGE 4X4 12PLY STRL (GAUZE/BANDAGES/DRESSINGS) IMPLANT
GLOVE BIO SURGEON STRL SZ7.5 (GLOVE) ×2 IMPLANT
GLOVE BIOGEL PI IND STRL 7.0 (GLOVE) IMPLANT
GLOVE BIOGEL PI IND STRL 7.5 (GLOVE) IMPLANT
GLOVE BIOGEL PI INDICATOR 7.0 (GLOVE) ×2
GLOVE BIOGEL PI INDICATOR 7.5 (GLOVE) ×2
GLOVE INDICATOR 8.0 STRL GRN (GLOVE) ×2 IMPLANT
GLOVE SURG SS PI 7.0 STRL IVOR (GLOVE) ×2 IMPLANT
GOWN STRL REUS W/TWL LRG LVL3 (GOWN DISPOSABLE) ×1 IMPLANT
GOWN STRL REUS W/TWL XL LVL3 (GOWN DISPOSABLE) ×6 IMPLANT
GRASPER SUT TROCAR 14GX15 (MISCELLANEOUS) ×2 IMPLANT
HOVERMATT SINGLE USE (MISCELLANEOUS) ×2 IMPLANT
KIT BASIN (CUSTOM PROCEDURE TRAY) ×2 IMPLANT
KIT TURNOVER KIT A (KITS) IMPLANT
MARKER SKIN DUAL TIP RULER LAB (MISCELLANEOUS) ×2 IMPLANT
NDL SPNL 22GX3.5 QUINCKE BK (NEEDLE) ×1 IMPLANT
NEEDLE SPNL 22GX3.5 QUINCKE BK (NEEDLE) ×2 IMPLANT
PACK UNIVERSAL I (CUSTOM PROCEDURE TRAY) ×2 IMPLANT
PENCIL SMOKE EVACUATOR (MISCELLANEOUS) IMPLANT
RELOAD STAPLE 60 3.6 BLU REG (STAPLE) ×1 IMPLANT
RELOAD STAPLE 60 3.8 GOLD REG (STAPLE) IMPLANT
RELOAD STAPLE 60 4.1 GRN THCK (STAPLE) ×1 IMPLANT
RELOAD STAPLE 60 BLK VRY/THCK (STAPLE) IMPLANT
RELOAD STAPLER 60MM BLK (STAPLE) IMPLANT
RELOAD STAPLER BLUE 60MM (STAPLE) ×5 IMPLANT
RELOAD STAPLER GOLD 60MM (STAPLE) IMPLANT
RELOAD STAPLER GREEN 60MM (STAPLE) IMPLANT
SCISSORS LAP 5X45 EPIX DISP (ENDOMECHANICALS) ×1 IMPLANT
SEALANT SURGICAL APPL DUAL CAN (MISCELLANEOUS) IMPLANT
SET IRRIG TUBING LAPAROSCOPIC (IRRIGATION / IRRIGATOR) ×2 IMPLANT
SET TUBE SMOKE EVAC HIGH FLOW (TUBING) ×2 IMPLANT
SHEARS HARMONIC ACE PLUS 45CM (MISCELLANEOUS) ×2 IMPLANT
SLEEVE GASTRECTOMY 40FR VISIGI (MISCELLANEOUS) ×2 IMPLANT
SLEEVE XCEL OPT CAN 5 100 (ENDOMECHANICALS) ×7 IMPLANT
SOL ANTI FOG 6CC (MISCELLANEOUS) ×1 IMPLANT
SOLUTION ANTI FOG 6CC (MISCELLANEOUS) ×1
SPONGE GAUZE 2X2 STER 10/PKG (GAUZE/BANDAGES/DRESSINGS)
SPONGE LAP 18X18 RF (DISPOSABLE) ×2 IMPLANT
STAPLER ECHELON BIOABSB 60 FLE (MISCELLANEOUS) ×10 IMPLANT
STAPLER ECHELON LONG 60 440 (INSTRUMENTS) ×2 IMPLANT
STAPLER RELOAD 60MM BLK (STAPLE)
STAPLER RELOAD BLUE 60MM (STAPLE) ×10
STAPLER RELOAD GOLD 60MM (STAPLE)
STAPLER RELOAD GREEN 60MM (STAPLE)
STRIP CLOSURE SKIN 1/2X4 (GAUZE/BANDAGES/DRESSINGS) ×2 IMPLANT
SUT ETHIBOND 0 36 GRN (SUTURE) ×1 IMPLANT
SUT MNCRL AB 4-0 PS2 18 (SUTURE) ×2 IMPLANT
SUT SURGIDAC NAB ES-9 0 48 120 (SUTURE) IMPLANT
SUT VIC AB 2-0 SH 27 (SUTURE) ×6
SUT VIC AB 2-0 SH 27X BRD (SUTURE) IMPLANT
SUT VICRYL 0 TIES 12 18 (SUTURE) ×2 IMPLANT
SYR 20ML LL LF (SYRINGE) ×2 IMPLANT
TOWEL OR 17X26 10 PK STRL BLUE (TOWEL DISPOSABLE) ×2 IMPLANT
TOWEL OR NON WOVEN STRL DISP B (DISPOSABLE) ×2 IMPLANT
TROCAR BLADELESS 15MM (ENDOMECHANICALS) ×2 IMPLANT
TROCAR BLADELESS OPT 5 100 (ENDOMECHANICALS) ×2 IMPLANT
TROCAR XCEL 12X100 BLDLESS (ENDOMECHANICALS) ×1 IMPLANT
TUBE CALIBRATION LAPBAND (TUBING) ×1 IMPLANT
TUBING CONNECTING 10 (TUBING) ×3 IMPLANT
TUBING ENDO SMARTCAP (MISCELLANEOUS) ×2 IMPLANT

## 2019-10-10 NOTE — Progress Notes (Signed)
Discussed post op day goals with patient including ambulation, IS, diet progression, pain, and nausea control.  BSTOP education provided including BSTOP information guide, "Guide for Pain Management after your Bariatric Procedure".  Questions answered. 

## 2019-10-10 NOTE — Progress Notes (Signed)
NUTRITION NOTE  Consult received for Bariatric Diet teaching per DROP protocol. Patient is POD #0 lap sleeve gastrectomy, hiatal hernia repair, upper endoscopy, lap repair of small bowel mesentery. Bariatric Nurse Educator to provide all post-op/pre-discharge instruction, including diet-related instruction.  If additional nutrition-related needs arise, please re-consult RD.     Jarome Matin, MS, RD, LDN, CNSC Inpatient Clinical Dietitian RD pager # available in [place]  After hours/weekend pager # available in [facility]

## 2019-10-10 NOTE — Progress Notes (Signed)
Had a little confusion with lab draw for this evening but it was corrected and the lab was drawn.

## 2019-10-10 NOTE — Discharge Instructions (Signed)
   GASTRIC BYPASS/SLEEVE  Home Care Instructions   These instructions are to help you care for yourself when you go home.  Call: If you have any problems. . Call [phone] and ask for the surgeon on call . If you need immediate help, come to the ER at [facility].  . Tell the ER staff that you are a new post-op gastric bypass or gastric sleeve patient   Signs and symptoms to report: . Severe vomiting or nausea o If you cannot keep down clear liquids for longer than 1 day, call your surgeon  . Abdominal pain that does not get better after taking your pain medication . Fever over 100.4 F with chills . Heart beating over 100 beats a minute . Shortness of breath at rest . Chest pain .  Redness, swelling, drainage, or foul odor at incision (surgical) sites .  If your incisions open or pull apart . Swelling or pain in calf (lower leg) . Diarrhea (Loose bowel movements that happen often), frequent watery, uncontrolled bowel movements . Constipation, (no bowel movements for 3 days) if this happens: Pick one o Milk of Magnesia, 2 tablespoons by mouth, 3 times a day for 2 days if needed o Stop taking Milk of Magnesia once you have a bowel movement o Call your doctor if constipation continues Or o Miralax  (instead of Milk of Magnesia) following the label instructions o Stop taking Miralax once you have a bowel movement o Call your doctor if constipation continues . Anything you think is not normal   Normal side effects after surgery: . Unable to sleep at night or unable to focus . Irritability or moody . Being tearful (crying) or depressed These are common complaints, possibly related to your anesthesia medications that put you to sleep, stress of surgery, and change in lifestyle.  This usually goes away a few weeks after surgery.  If these feelings continue, call your primary care doctor.   Wound Care: You may have surgical glue, steri-strips, or staples over your incisions after  surgery . Surgical glue:  Looks like a clear film over your incisions and will wear off a little at a time . Steri-strips: Strips of tape over your incisions. You may notice a yellowish color on the skin under the steri-strips. This is used to make the   steri-strips stick better. Do not pull the steri-strips off - let them fall off . Staples: Staples may be removed before you leave the hospital o If you go home with staples, call [facility], [phone] at for an appointment with your surgeon's nurse to have staples removed 10 days after surgery. . Showering: You may shower two (2) days after your surgery unless your surgeon tells you differently o Wash gently around incisions with warm soapy water, rinse well, and gently pat dry  o No tub baths until staples are removed, steri-strips fall off or glue is gone.    Medications: . Medications should be liquid or crushed if larger than the size of a dime . Extended release pills (medication that release a little bit at a time through the day) should NOT be crushed or cut. (examples include XL, ER, DR, SR) . Depending on the size and number of medications you take, you may need to space (take a few throughout the day)/change the time you take your medications so that you do not over-fill your pouch (smaller stomach) . Make sure you follow-up with your primary care doctor to   make medication changes needed during rapid weight loss and life-style changes . If you have diabetes, follow up with the doctor that orders your diabetes medication(s) within one week after surgery and check your blood sugar regularly. . Do not drive while taking prescription pain medication  . It is ok to take Tylenol by the bottle instructions with your pain medicine or instead of your pain medicine as needed.  DO NOT TAKE NSAIDS (EXAMPLES OF NSAIDS:  IBUPROFREN/ NAPROXEN)  Diet:                    First 2 Weeks  You will see the dietician t about two (2) weeks  after your surgery. The dietician will increase the types of foods you can eat if you are handling liquids well: . If you have severe vomiting or nausea and cannot keep down clear liquids lasting longer than 1 day, call your surgeon @ ([phone]) Protein Shake . Drink at least 2 ounces of shake 5-6 times per day . Each serving of protein shakes (usually 8 - 12 ounces) should have: o 15 grams of protein  o And no more than 5 grams of carbohydrate  . Goal for protein each day: o Men = 80 grams per day o Women = 60 grams per day . Protein powder may be added to fluids such as non-fat milk or Lactaid milk or unsweetened Soy/Almond milk (limit to 35 grams added protein powder per serving)  Hydration . Slowly increase the amount of water and other clear liquids as tolerated (See Acceptable Fluids) . Slowly increase the amount of protein shake as tolerated  .  Sip fluids slowly and throughout the day.  Do not use straws. . May use sugar substitutes in small amounts (no more than 6 - 8 packets per day; i.e. Splenda)  Fluid Goal . The first goal is to drink at least 8 ounces of protein shake/drink per day (or as directed by the nutritionist); some examples of protein shakes are Syntrax Nectar, Adkins Advantage, EAS Edge HP, and Unjury. See handout from pre-op Bariatric Education Class: o Slowly increase the amount of protein shake you drink as tolerated o You may find it easier to slowly sip shakes throughout the day o It is important to get your proteins in first . Your fluid goal is to drink 64 - 100 ounces of fluid daily o It may take a few weeks to build up to this . 32 oz (or more) should be clear liquids  And  . 32 oz (or more) should be full liquids (see below for examples) . Liquids should not contain sugar, caffeine, or carbonation  Clear Liquids: . Water or Sugar-free flavored water (i.e. Fruit H2O, Propel) . Decaffeinated coffee or tea (sugar-free) . Crystal Lite, Wyler's Lite,  Minute Maid Lite . Sugar-free Jell-O . Bouillon or broth . Sugar-free Popsicle:   *Less than 20 calories each; Limit 1 per day  Full Liquids: Protein Shakes/Drinks + 2 choices per day of other full liquids . Full liquids must be: o No More Than 15 grams of Carbs per serving  o No More Than 3 grams of Fat per serving . Strained low-fat cream soup (except Cream of Potato or Tomato) . Non-Fat milk . Fat-free Lactaid Milk . Unsweetened Soy Or Unsweetened Almond Milk . Low Sugar yogurt (Dannon Lite & Fit, Greek yogurt; Oikos Triple Zero; Chobani Simply 100; Yoplait 100 calorie Greek - No Fruit on the Bottom)    Vitamins   and Minerals . Start 1 day after surgery unless otherwise directed by your surgeon . Chewable Bariatric Specific Multivitamin / Multimineral Supplement with iron (Example: Bariatric Advantage Multi EA) . Chewable Calcium with Vitamin D-3 (Example: 3 Chewable Calcium Plus 600 with Vitamin D-3) o Take 500 mg three (3) times a day for a total of 1500 mg each day o Do not take all 3 doses of calcium at one time as it may cause constipation, and you can only absorb 500 mg  at a time  o Do not mix multivitamins containing iron with calcium supplements; take 2 hours apart . Menstruating women and those with a history of anemia (a blood disease that causes weakness) may need extra iron o Talk with your doctor to see if you need more iron . Do not stop taking or change any vitamins or minerals until you talk to your dietitian or surgeon . Your Dietitian and/or surgeon must approve all vitamin and mineral supplements   Activity and Exercise: Limit your physical activity as instructed by your doctor.  It is important to continue walking at home.  During this time, use these guidelines: . Do not lift anything greater than ten (10) pounds for at least two (2) weeks . Do not go back to work or drive until your surgeon says you can . You may have sex when you feel comfortable  o It is  VERY important for female patients to use a reliable birth control method; fertility often increases after surgery  o All hormonal birth control will be ineffective for 30 days after surgery due to medications given during surgery a barrier method must be used. o Do not get pregnant for at least 18 months . Start exercising as soon as your doctor tells you that you can o Make sure your doctor approves any physical activity . Start with a simple walking program . Walk 5-15 minutes each day, 7 days per week.  . Slowly increase until you are walking 30-45 minutes per day Consider joining our BELT program. [phone] or email belt@[employer]   Special Instructions Things to remember: . Use your CPAP when sleeping if this applies to you  . [facility] has two free Bariatric Surgery Support Groups that meet monthly o The 3rd Thursday of each month, 6 pm, [facility]  o The 2nd Friday of each month, 11:45 am in the private dining room in the basement of [facility] . It is very important to keep all follow up appointments with your surgeon, dietitian, primary care physician, and behavioral health practitioner . Routine follow up schedule with your surgeon include appointments at 2-3 weeks, 6-8 weeks, 6 months, and 1 year at a minimum.  Your surgeon may request to see you more often.   o After the first year, please follow up with your bariatric surgeon and dietitian at least once a year in order to maintain best weight loss results [facility]: [phone] [facility]: [phone] Bariatric Nurse Coordinator: [phone]      Reviewed and Endorsed  by [place] Patient Education Committee, June, 2016 Edits Approved: Aug, 2018    

## 2019-10-10 NOTE — Transfer of Care (Signed)
Immediate Anesthesia Transfer of Care Note  Patient: Wanda Peters  Procedure(s) Performed: LAPAROSCOPIC GASTRIC SLEEVE RESECTION AND HIATAL HERNIA REPAIR, Upper Endo, ERAS Pathway; LAPAROSCOPIC REPAIR OF SMALL BOWEL MESENTERY (N/A Abdomen)  Patient Location: PACU  Anesthesia Type:General  Level of Consciousness: awake, alert , oriented and patient cooperative  Airway & Oxygen Therapy: Patient Spontanous Breathing and Patient connected to face mask oxygen  Post-op Assessment: Report given to RN, Post -op Vital signs reviewed and stable and Patient moving all extremities  Post vital signs: Reviewed and stable  Last Vitals:  Vitals Value Taken Time  BP 116/71 10/10/19 1220  Temp    Pulse 63 10/10/19 1223  Resp 17 10/10/19 1223  SpO2 98 % 10/10/19 1223  Vitals shown include unvalidated device data.  Last Pain:  Vitals:   10/10/19 0750  TempSrc: Oral         Complications: No apparent anesthesia complications

## 2019-10-10 NOTE — Op Note (Signed)
Preoperative diagnosis: laparoscopic sleeve gastrectomy  Postoperative diagnosis: Same   Procedure: Upper endoscopy   Surgeon: Massiah Minjares, M.D.  Anesthesia: Gen.   Indications for procedure: This patient was undergoing a laparoscopic sleeve gastrectomy.   Description of procedure: The endoscopy was placed in the mouth and into the oropharynx and under endoscopic vision it was advanced to the esophagogastric junction. The pouch was insufflated and no bleeding or bubbles were seen. The GEJ was identified at 41cm from the teeth. No bleeding or leaks were detected. The scope was withdrawn without difficulty.   Wanda Peters, M.D. General, Bariatric, & Minimally Invasive Surgery [facility]    

## 2019-10-10 NOTE — Interval H&P Note (Signed)
History and Physical Interval Note:  10/10/2019 9:01 AM  Wanda Peters  has presented today for surgery, with the diagnosis of Morbid Obesity, Bilateral Hip Pain.  The various methods of treatment have been discussed with the patient and family. After consideration of risks, benefits and other options for treatment, the patient has consented to  Procedure(s): LAPAROSCOPIC GASTRIC SLEEVE RESECTION, Upper Endo, ERAS Pathway (N/A) as a surgical intervention.  The patient's history has been reviewed, patient examined, no change in status, stable for surgery.  I have reviewed the patient's chart and labs.  Questions were answered to the patient's satisfaction.    With possible hiatal hernia repair  Wanda Peters. Redmond Pulling, MD, FACS General, Bariatric, & Minimally Invasive Surgery [facility] [place]  [place]

## 2019-10-10 NOTE — Anesthesia Procedure Notes (Signed)
Procedure Name: Intubation Date/Time: 10/10/2019 9:42 AM Performed by: Mitzie Na, CRNA Pre-anesthesia Checklist: Patient identified, Emergency Drugs available, Suction available and Patient being monitored Patient Re-evaluated:Patient Re-evaluated prior to induction Oxygen Delivery Method: Circle system utilized Preoxygenation: Pre-oxygenation with 100% oxygen Induction Type: IV induction Ventilation: Mask ventilation without difficulty and Oral airway inserted - appropriate to patient size Laryngoscope Size: Mac and 3 Grade View: Grade I Tube type: Oral Tube size: 7.5 mm Number of attempts: 1 Airway Equipment and Method: Stylet and Oral airway Placement Confirmation: ETT inserted through vocal cords under direct vision,  positive ETCO2 and breath sounds checked- equal and bilateral Secured at: 23 cm Tube secured with: Tape Dental Injury: Teeth and Oropharynx as per pre-operative assessment

## 2019-10-10 NOTE — Op Note (Addendum)
10/10/2019 Wanda Peters December 26, 1975 [id number]   PRE-OPERATIVE DIAGNOSIS:     Severe obesity BMI 42   Other hyperlipidemia   Chronic hip pain, bilateral   SUI (stress urinary incontinence, female)  POST-OPERATIVE DIAGNOSIS:  Same + small sliding hiatal hernia + small bowel mesentery injury  PROCEDURE:  Procedure(s): LAPAROSCOPIC SLEEVE GASTRECTOMY WITH HIATAL HERNIA REPAIR UPPER GI ENDOSCOPY LAPAROSCOPIC BILATERAL TAP BLOCK LAPAROSCOPIC REPAIR OF SMALL BOWEL MESENTERY  SURGEON:  Surgeon(s): Gayland Curry, MD FACS FASMBS  ASSISTANTS: Gurney Maxin MD FACS  ANESTHESIA:   general  DRAINS: none   BOUGIE: 40 fr ViSiGi  LOCAL MEDICATIONS USED:   Exparel  EBL: 300 cc  SPECIMEN:  Source of Specimen:  Greater curvature of stomach  DISPOSITION OF SPECIMEN:  PATHOLOGY  COUNTS:  YES  INDICATION FOR PROCEDURE: This is a very pleasant 44 y.o.-year-old morbidly obese female who has had unsuccessful attempts for sustained weight loss. The patient presents today for a planned laparoscopic sleeve gastrectomy with upper endoscopy. We have discussed the risk and benefits of the procedure extensively preoperatively. Please see my separate notes.  PROCEDURE: After obtaining informed consent and receiving 5000 units of subcutaneous heparin, the patient was brought to the operating room at [facility] and placed supine on the operating room table. General endotracheal anesthesia was established. Sequential compression devices were placed. A orogastric tube was placed. The patient's abdomen was prepped and draped in the usual standard surgical fashion. The patient received preoperative IV antibiotic. A surgical timeout was performed. ERAS protocol used.   Access to the abdomen was achieved using a 5 mm 0 laparoscope thru a 5 mm trocar In the left upper Quadrant 2 fingerbreadths below the left subcostal margin using the Optiview technique.  I passed through the first layer of the  anterior abdominal wall as expected but I did not come across the second layer of muscle.  I insufflated the abdomen but had a high pressure reading thus confirming my suspicion that I was not intraperitoneal.  I pulled back on the whole apparatus and we advanced the camera with the trocar through all layers of the abdominal in a different angle again I passed the first layer of the muscle and did not see the second layer of muscle or fill in the release of the peritoneum.  I thought I was still perhaps in the abdominal wall.  I insufflated and I gently pulled back on the trocar and camera and it was evident that I was already in the abdomen probably in a section of mesentery.  Pneumoperitoneum was smoothly established up to 15 mm of mercury. The laparoscope was advanced and the abdominal cavity was surveilled.  There was blood in the abdominal cavity.  It became obvious that I had probably gone through the section of the mesentery.  Since vital signs are stable.  There is no additional signs of bleeding so we proceeded with placing her additional trochars.     A 5 mm trocar was placed slightly above and to the left of the umbilicus under direct visualization.   A 5 mm trocar was placed in the lateral right upper quadrant along with a 15 mm trocar in the mid right abdomen. A final 5 mm trocar was placed in the lateral LUQ.    Before proceeding with the procedure needed to identify the source of small bowel mesentery injury.  This is a known complication that can occur with Optiview technique.  My assistant scrubbed in.  We lifted  the omentum and the transverse colon up.  I identified the ligament of Treitz.  Identified the small bowel.  There was blood in the left paracolic gutter.  We started running the bowel at the ligament of Treitz using atraumatic bowel graspers.  There was no evidence of small bowel luminal injury.  The small bowel mesentery appeared thickened consistent with a probable hematoma.  Probably  about 60 cm from the ligament of Treitz there was evidence of CO2 within the mesentery next to the small bowel wall.  Identified a injury to the small bowel mesentery.  It was the width of a 5 mm trocar.  There was no active bleeding coming up out of the mesentery.  We continue to run the remaining small bowel.  On the opposite or posterior veil of that section of small bowel identified the posterior location where the trocar had gone through.  Again there was no signs of ongoing bleeding.  There were 2 defects in the mesentery posteriorly right next to 1 another.  We continued to run the small bowel all the way to the cecum.  There was no evidence of luminal injury.  We were then went about repairing the mesenteric injuries.  Each were repaired with a 2-0 Vicryl laparoscopically-2 on the posterior aspect of the small bowel mesentery with the corresponding anterior injury.  We then reflected the small bowel to the right.  We could visualize the retroperitoneum.  We identified the inferior mesenteric vein.  She had a paucity of retroperitoneal fat.  We could easily identify the inferior mesenteric vein and the psoas muscle.  There is no evidence of a retroperitoneal hematoma.  We released pneumoperitoneum to see if the mesentery would start bleeding once pneumoperitoneum was released.  We left the abdomen desufflated for approximately 3 minutes.  I reinsufflated the abdomen and inspected the mesenteric repair areas.  There is no signs of additional bleeding.  There is no signs of expanding hematoma.  The bowel itself was viable.  There is no signs of ischemia to the actual bowel.  I reran the small bowel again and did not demonstrate any luminal injury.  The patient was hemodynamically stable.  There is no signs of additional bleeding.  There is no sign of bowel compromise.  Therefore I decided to proceed with the procedure.   A laparoscopic bilateral tap block was performed with a combination of Exparel and  Marcaine using laparoscopic guidance.  Patient was placed in extreme reverse Trendelenburg.  A 5 mm trocar was placed in the subxiphoid position for the Nathanson retractor to lift up the left lobe of the liver.  The stomach was inspected. It was completely decompressed and the orogastric tube was removed.  There was no anterior dimple that was obviously visible. However her preop UGI showed a small sliding hiatal hernia so I decided to test for one. The calibration tube was placed in the oropharynx and guided down into the stomach by the CRNA. 10 mL of air was insufflated into the calibration balloon. The calibration tubing was then gently pulled back by the CRNA and it slid past the GE junction. At this point the calibration tubing was desufflated and pulled back into the esophagus. This confirmed my suspicion of a clinically significant hiatal hernia. The gastrohepatic ligament was incised with harmonic scalpel. The right crus was identified. We identified the crossing fat along the right crus. The adipose tissue just above this area was incised with harmonic scalpel. I then bluntly dissected  out this area and identified the left crus. There was evidence of a hiatal hernia. I then mobilized the esophagus. The left and right crus were further mobilized with blunt dissection. I was then able to reapproximate the left and right crus with 0 Ethibond suture and securing it with a titanium tyknot. We then had the CRNA readvanced the calibration tubing back into the stomach. 10 mL of air was insufflated into the calibration tube balloon. The calibration tube was then gently pulled back and there was resistance at the GE junction. The tube did not slide back up into the esophagus. At this point the calibration tubing was deflated and removed from the patient's body.   We identified the pylorus and measured 6 cm proximal to the pylorus and identified an area of where we would start taking down the short gastric  vessels. Harmonic scalpel was used to take down the short gastric vessels along the greater curvature of the stomach. We were able to enter the lesser sac. We continued to march along the greater curvature of the stomach taking down the short gastrics. As we approached the gastrosplenic ligament we took care in this area not to injure the spleen. We were able to take down the entire gastrosplenic ligament. We then mobilized the fundus away from the left crus of diaphragm. There were not any significant posterior gastric avascular attachments. She had a very thin retroperitoneum - could easily pancreas, splenic & gastric artery - all of which were normal. No blood in retroperitoneum. This left the stomach completely mobilized. No vessels had been taken down along the lesser curvature of the stomach.  We then reidentified the pylorus. A 40Fr ViSiGi was then placed in the oropharynx and advanced down into the stomach and placed in the distal antrum and positioned along the lesser curvature. It was placed under suction which secured the 40Fr ViSiGi in place along the lesser curve. Then using the Ethicon echelon 60 mm stapler with a green load with Seamguard, I placed a stapler along the antrum approximately 5 cm from the pylorus. The stapler was angled so that there is ample room at the angularis incisura. I then fired the first staple load after inspecting it posteriorly to ensure adequate space both anteriorly and posteriorly. At this point I still was not completely past the angularis so with a blue load with Seamguard, I placed the stapler in position just inside the prior stapleline. We then rotated the stomach to insure that there was adequate anteriorly as well as posteriorly. The stapler was then fired.  At this point I started using 60 mm blue load staple cartridges with Seamguard. The echelon stapler was then repositioned with a 60 mm blue load with Seamguard and we continued to march up along the [place]. My  assistant was holding traction along the greater curvature stomach along the cauterized short gastric vessels ensuring that the stomach was symmetrically retracted. Prior to each firing of the staple, we rotated the stomach to ensure that there is adequate stomach left.  As we approached the fundus, I used 60 mm blue cartridge with Seamguard aiming  lateral to the GE junction after mobilizing some of the esophageal fat pad.  The sleeve was inspected. There is no evidence of cork screw. The staple line appeared hemostatic. The CRNA inflated the ViSiGi to the green zone and the upper abdomen was flooded with saline. There were no bubbles. The sleeve was decompressed and the ViSiGi removed. My assistant scrubbed out and  performed an upper endoscopy. The sleeve easily distended with air and the scope was easily advanced to the pylorus. There is no evidence of internal bleeding or cork screwing. There was no narrowing at the angularis. There is no evidence of bubbles. Please see his operative note for further details. The gastric sleeve was decompressed and the endoscope was removed.   I irrigated the abdomen and tried to evacuate as much of the old blood as possible. I reinspected the small bowel mesenteric repair both anterior and posteriorly there is no signs of bleeding.  The bowel was viable.  There is no signs of ischemia.  No sign of expanding hematoma.    The greater curvature the stomach was grasped with a laparoscopic grasper and removed from the 15 mm trocar site.  The liver retractor was removed. I then closed the 15 mm trocar site with 2 interrupted 0 Vicryl sutures through the fascia using the endoclose. The closure was viewed laparoscopically and it was airtight. Remaining Exparel was then infiltrated in the preperitoneal spaces around the trocar sites. Pneumoperitoneum was released. All trocar sites were closed with a 4-0 Monocryl in a subcuticular fashion followed by the application of benzoin,  steri-strips, and bandaids. The patient was extubated and taken to the recovery room in stable condition. All needle, instrument, and sponge counts were correct x2. There are no immediate complications  (1) 60 mm green with Seamguard (5) 60 mm blue with seamguard  PLAN OF CARE: Admit to inpatient   PATIENT DISPOSITION:  PACU - hemodynamically stable.   Delay start of Pharmacological VTE agent (>24hrs) due to surgical blood loss or risk of bleeding:  yes  Leighton Ruff. Redmond Pulling, MD, FACS FASMBS General, Bariatric, & Minimally Invasive Surgery [facility], [place]

## 2019-10-10 NOTE — Anesthesia Preprocedure Evaluation (Signed)
Anesthesia Evaluation  Patient identified by MRN, date of birth, ID band Patient awake    Reviewed: Allergy & Precautions, NPO status , Patient's Chart, lab work & pertinent test results  History of Anesthesia Complications Negative for: history of anesthetic complications  Airway Mallampati: II  TM Distance: >3 FB Neck ROM: Full    Dental  (+) Teeth Intact   Pulmonary neg pulmonary ROS,    Pulmonary exam normal        Cardiovascular negative cardio ROS Normal cardiovascular exam     Neuro/Psych PSYCHIATRIC DISORDERS Depression negative neurological ROS     GI/Hepatic negative GI ROS, Neg liver ROS,   Endo/Other  Morbid obesity (BMI 42)  Renal/GU negative Renal ROS  negative genitourinary   Musculoskeletal negative musculoskeletal ROS (+)   Abdominal   Peds  Hematology negative hematology ROS (+)   Anesthesia Other Findings   Reproductive/Obstetrics negative OB ROS                            Anesthesia Physical Anesthesia Plan  ASA: III  Anesthesia Plan: General   Post-op Pain Management:    Induction: Intravenous  PONV Risk Score and Plan: 3 and Ondansetron, Dexamethasone, Treatment may vary due to age or medical condition and Midazolam  Airway Management Planned: Oral ETT  Additional Equipment: None  Intra-op Plan:   Post-operative Plan: Extubation in OR  Informed Consent: I have reviewed the patients History and Physical, chart, labs and discussed the procedure including the risks, benefits and alternatives for the proposed anesthesia with the patient or authorized representative who has indicated his/her understanding and acceptance.     Dental advisory given  Plan Discussed with:   Anesthesia Plan Comments:        Anesthesia Quick Evaluation

## 2019-10-10 NOTE — Anesthesia Postprocedure Evaluation (Signed)
Anesthesia Post Note  Patient: [name]  Procedure(s) Performed: LAPAROSCOPIC GASTRIC SLEEVE RESECTION AND HIATAL HERNIA REPAIR, Upper Endo, ERAS Pathway; LAPAROSCOPIC REPAIR OF SMALL BOWEL MESENTERY (N/A Abdomen)     Patient location during evaluation: PACU Anesthesia Type: General Level of consciousness: awake and alert Pain management: pain level controlled Vital Signs Assessment: post-procedure vital signs reviewed and stable Respiratory status: spontaneous breathing, nonlabored ventilation and respiratory function stable Cardiovascular status: blood pressure returned to baseline and stable Postop Assessment: no apparent nausea or vomiting Anesthetic complications: no    Last Vitals:  Vitals:   10/10/19 1330 10/10/19 1355  BP: 112/78 99/72  Pulse: (!) 54 (!) 55  Resp: 13 16  Temp: (!) 36.4 C (!) 36.3 C  SpO2: 98% 97%    Last Pain:  Vitals:   10/10/19 1355  TempSrc: Oral  PainSc:                  Lidia Collum

## 2019-10-10 NOTE — Progress Notes (Signed)
PHARMACY CONSULT FOR:  Risk Assessment for Post-Discharge VTE Following Bariatric Surgery  Post-Discharge VTE Risk Assessment: This patient's probability of 30-day post-discharge VTE is increased due to the factors marked:   Female    Age >/=60 years    BMI >/=50 kg/m2    CHF    Dyspnea at Rest    Paraplegia   X Non-gastric-band surgery    Operation Time >/=3 hr    Return to OR     Length of Stay >/= 3 d      Hx of VTE   Hypercoagulable condition   Significant venous stasis   Predicted probability of 30-day post-discharge VTE: 0.16%  Other patient-specific factors to consider: none   Recommendation for Discharge: No pharmacologic prophylaxis post-discharge  Wanda Peters is a 44 y.o. female who underwent  Sleeve gastrectomy on 10/10/19.   Case start: 0957 Case end: 1210   Allergies  Allergen Reactions  . Sulfonamide Derivatives Rash    Patient Measurements: Height: 5\' 6"  (167.6 cm) Weight: 119.8 kg (264 lb 3.2 oz) IBW/kg (Calculated) : 59.3 Body mass index is 42.64 kg/m.  No results for input(s): WBC, HGB, HCT, PLT, APTT, CREATININE, LABCREA, CREATININE, CREAT24HRUR, MG, PHOS, ALBUMIN, PROT, ALBUMIN, AST, ALT, ALKPHOS, BILITOT, BILIDIR, IBILI in the last 72 hours. Estimated Creatinine Clearance: 117.1 mL/min (by C-G formula based on SCr of 0.61 mg/dL).    Past Medical History:  Diagnosis Date  . Dry skin   . Fatigue   . Rupture of ovarian cyst   . Seasonal allergies   . Swelling of extremity   . Trouble in sleeping   . Vitamin D deficiency      Medications Prior to Admission  Medication Sig Dispense Refill Last Dose  . Cholecalciferol (VITAMIN D) 125 MCG (5000 UT) CAPS Take 5,000 Units by mouth daily.    10/09/2019 at Unknown time  . escitalopram (LEXAPRO) 10 MG tablet Take 1 tablet (10 mg total) by mouth daily. 30 tablet 0 10/09/2019 at Unknown time  . fexofenadine (ALLEGRA) 180 MG tablet Take 180 mg by mouth daily.   10/10/2019 at 0600  .  fluticasone (FLONASE) 50 MCG/ACT nasal spray Place 1 spray into both nostrils daily.   10/10/2019 at 0600  . hydrochlorothiazide (HYDRODIURIL) 25 MG tablet Take 1 tablet (25 mg total) by mouth daily. 30 tablet 0 10/09/2019 at Unknown time  . Norethindrone-Ethinyl Estradiol-Fe Biphas (LO LOESTRIN FE) 1 MG-10 MCG / 10 MCG tablet Take 1 tablet by mouth daily.    09/09/2019  . polyethylene glycol (MIRALAX / GLYCOLAX) 17 g packet Take 17 g by mouth daily.   10/09/2019 at Unknown time  . ketoconazole (NIZORAL) 2 % cream Apply 1 application topically daily. (Patient not taking: Reported on 09/20/2019) 15 g 0 Not Taking at Unknown time       Netta Cedars PharmD 10/10/2019,1:10 PM

## 2019-10-10 NOTE — Progress Notes (Signed)
Ice chips started

## 2019-10-11 LAB — CBC WITH DIFFERENTIAL/PLATELET
Abs Immature Granulocytes: 0.04 10*3/uL (ref 0.00–0.07)
Basophils Absolute: 0 10*3/uL (ref 0.0–0.1)
Basophils Relative: 0 %
Eosinophils Absolute: 0 10*3/uL (ref 0.0–0.5)
Eosinophils Relative: 0 %
HCT: 34.9 % — ABNORMAL LOW (ref 36.0–46.0)
Hemoglobin: 11.2 g/dL — ABNORMAL LOW (ref 12.0–15.0)
Immature Granulocytes: 1 %
Lymphocytes Relative: 14 %
Lymphs Abs: 1.2 10*3/uL (ref 0.7–4.0)
MCH: 32 pg (ref 26.0–34.0)
MCHC: 32.1 g/dL (ref 30.0–36.0)
MCV: 99.7 fL (ref 80.0–100.0)
Monocytes Absolute: 0.9 10*3/uL (ref 0.1–1.0)
Monocytes Relative: 10 %
Neutro Abs: 6.7 10*3/uL (ref 1.7–7.7)
Neutrophils Relative %: 75 %
Platelets: 199 10*3/uL (ref 150–400)
RBC: 3.5 MIL/uL — ABNORMAL LOW (ref 3.87–5.11)
RDW: 12.7 % (ref 11.5–15.5)
WBC: 8.8 10*3/uL (ref 4.0–10.5)
nRBC: 0 % (ref 0.0–0.2)

## 2019-10-11 LAB — COMPREHENSIVE METABOLIC PANEL
ALT: 44 U/L (ref 0–44)
AST: 32 U/L (ref 15–41)
Albumin: 3.4 g/dL — ABNORMAL LOW (ref 3.5–5.0)
Alkaline Phosphatase: 65 U/L (ref 38–126)
Anion gap: 6 (ref 5–15)
BUN: 11 mg/dL (ref 6–20)
CO2: 24 mmol/L (ref 22–32)
Calcium: 8.4 mg/dL — ABNORMAL LOW (ref 8.9–10.3)
Chloride: 109 mmol/L (ref 98–111)
Creatinine, Ser: 0.59 mg/dL (ref 0.44–1.00)
GFR calc Af Amer: >60 mL/min (ref >60)
GFR calc non Af Amer: >60 mL/min (ref >60)
Glucose, Bld: 133 mg/dL — ABNORMAL HIGH (ref 70–99)
Potassium: 4.3 mmol/L (ref 3.5–5.1)
Sodium: 139 mmol/L (ref 135–145)
Total Bilirubin: 0.5 mg/dL (ref 0.3–1.2)
Total Protein: 6.2 g/dL — ABNORMAL LOW (ref 6.5–8.1)

## 2019-10-11 LAB — HEMOGLOBIN AND HEMATOCRIT, BLOOD
HCT: 33.7 % — ABNORMAL LOW (ref 36.0–46.0)
Hemoglobin: 10.7 g/dL — ABNORMAL LOW (ref 12.0–15.0)

## 2019-10-11 LAB — SURGICAL PATHOLOGY

## 2019-10-11 MED ORDER — METHOCARBAMOL 1000 MG/10ML IJ SOLN
500.0000 mg | Freq: Three times a day (TID) | INTRAVENOUS | Status: DC | PRN
  Administered 2019-10-11 (×2): 500 mg via INTRAVENOUS
  Filled 2019-10-11 (×2): qty 5
  Filled 2019-10-11: qty 500
  Filled 2019-10-11: qty 5
  Filled 2019-10-11: qty 500

## 2019-10-11 NOTE — Progress Notes (Signed)
1 Day Post-Op   Subjective/Chief Complaint: (rounded with bari nurse) C/o b/l shoulder pain, L neck discomfort, rt sided discomfort; not much nausea. Drinking liquids ok; ambulated multiple times   Objective: Vital signs in last 24 hours: Temp:  [96.5 F (35.8 C)-99.5 F (37.5 C)] 98.6 F (37 C) (05/04 0612) Pulse Rate:  [54-70] 63 (05/04 0612) Resp:  [12-18] 18 (05/04 0612) BP: (99-128)/(56-78) 105/72 (05/04 0612) SpO2:  [94 %-100 %] 99 % (05/04 0612) Last BM Date: 10/10/19  Intake/Output from previous day: 05/03 0701 - 05/04 0700 In: 4330.4 [P.O.:660; I.V.:3670.4] Out: 1650 [Urine:1350; Blood:300] Intake/Output this shift: Total I/O In: -  Out: 700 [Urine:700]  Alert, nontoxic Sitting in bedside chair Non labored Reg Soft, mild expected TTP, dressing c/d/i No edema, +SCDs  Lab Results:  Recent Labs    10/10/19 1944 10/11/19 0435  WBC 10.4 8.8  HGB 12.1 11.2*  HCT 38.0 34.9*  PLT 191 199   BMET Recent Labs    10/11/19 0435  NA 139  K 4.3  CL 109  CO2 24  GLUCOSE 133*  BUN 11  CREATININE 0.59  CALCIUM 8.4*   PT/INR No results for input(s): LABPROT, INR in the last 72 hours. ABG No results for input(s): PHART, HCO3 in the last 72 hours.  Invalid input(s): PCO2, PO2  Studies/Results: No results found.  Anti-infectives: Anti-infectives (From admission, onward)   Start     Dose/Rate Route Frequency Ordered Stop   10/10/19 0745  cefoTEtan (CEFOTAN) 2 g in sodium chloride 0.9 % 100 mL IVPB     2 g 200 mL/hr over 30 Minutes Intravenous On call to O.R. 10/10/19 0745 10/10/19 0947      Assessment/Plan: s/p Procedure(s): LAPAROSCOPIC GASTRIC SLEEVE RESECTION AND HIATAL HERNIA REPAIR, Upper Endo, ERAS Pathway; LAPAROSCOPIC REPAIR OF SMALL BOWEL MESENTERY (N/A)  No fever. No tachycardia. No wbc.  Cont oral diet as tolerated rediscussed intraop event with injury to sb mesentery, how we evaluated it, repaired it and rationale for proceeding with  case. I think she is having pain from mesenteric hematoma, remaining blood in peritoneal cavity irritating diaphragm.   ABL anemia - hgb 13 (preop)-->12 (immediate postop)-->12 (5/3 pm)-->11 this am. Normotensive. No tachycardia. Drop overnight maybe some ongoing ooze + dilution.  Therefore will hold chemical vte prophylaxis, repeat h&H later today  vte prophylaxis - scds only, pt is ambulating multiple times  Not sure if will be candidate for dc today - depends on hgb.   Will avoid toradol for now since ABL anemia  Add prn robaxin  Leighton Ruff. Redmond Pulling, MD, FACS General, Bariatric, & Minimally Invasive Surgery [facility], [place]   LOS: 1 day    Greer Pickerel 10/11/2019

## 2019-10-11 NOTE — Progress Notes (Signed)
Patient alert and oriented, Post op day 1.  Provided support and encouragement.  Encouraged pulmonary toilet, ambulation and small sips of liquids.  Completed 12 ounces of bari clear fluid and 8 ounces protein. All questions answered.  Will continue to monitor.

## 2019-10-12 LAB — BASIC METABOLIC PANEL
Anion gap: 6 (ref 5–15)
BUN: 9 mg/dL (ref 6–20)
CO2: 24 mmol/L (ref 22–32)
Calcium: 8.4 mg/dL — ABNORMAL LOW (ref 8.9–10.3)
Chloride: 111 mmol/L (ref 98–111)
Creatinine, Ser: 0.6 mg/dL (ref 0.44–1.00)
GFR calc Af Amer: >60 mL/min (ref >60)
GFR calc non Af Amer: >60 mL/min (ref >60)
Glucose, Bld: 118 mg/dL — ABNORMAL HIGH (ref 70–99)
Potassium: 4.1 mmol/L (ref 3.5–5.1)
Sodium: 141 mmol/L (ref 135–145)

## 2019-10-12 LAB — CBC WITH DIFFERENTIAL/PLATELET
Abs Immature Granulocytes: 0.02 10*3/uL (ref 0.00–0.07)
Basophils Absolute: 0 10*3/uL (ref 0.0–0.1)
Basophils Relative: 0 %
Eosinophils Absolute: 0.1 10*3/uL (ref 0.0–0.5)
Eosinophils Relative: 1 %
HCT: 33.3 % — ABNORMAL LOW (ref 36.0–46.0)
Hemoglobin: 10.5 g/dL — ABNORMAL LOW (ref 12.0–15.0)
Immature Granulocytes: 0 %
Lymphocytes Relative: 21 %
Lymphs Abs: 1.5 10*3/uL (ref 0.7–4.0)
MCH: 31.8 pg (ref 26.0–34.0)
MCHC: 31.5 g/dL (ref 30.0–36.0)
MCV: 100.9 fL — ABNORMAL HIGH (ref 80.0–100.0)
Monocytes Absolute: 0.7 10*3/uL (ref 0.1–1.0)
Monocytes Relative: 10 %
Neutro Abs: 5 10*3/uL (ref 1.7–7.7)
Neutrophils Relative %: 68 %
Platelets: 171 10*3/uL (ref 150–400)
RBC: 3.3 MIL/uL — ABNORMAL LOW (ref 3.87–5.11)
RDW: 12.9 % (ref 11.5–15.5)
WBC: 7.3 10*3/uL (ref 4.0–10.5)
nRBC: 0 % (ref 0.0–0.2)

## 2019-10-12 MED ORDER — ONDANSETRON 4 MG PO TBDP
4.0000 mg | ORAL_TABLET | Freq: Four times a day (QID) | ORAL | 0 refills | Status: DC | PRN
Start: 2019-10-12 — End: 2019-12-06

## 2019-10-12 MED ORDER — TRAMADOL HCL 50 MG PO TABS: 50.0000 mg | ORAL_TABLET | Freq: Four times a day (QID) | ORAL | 0 refills | Status: DC | PRN

## 2019-10-12 MED ORDER — PANTOPRAZOLE SODIUM 40 MG PO TBEC
40.0000 mg | DELAYED_RELEASE_TABLET | Freq: Every day | ORAL | 0 refills | Status: DC
Start: 2019-10-12 — End: 2021-07-02

## 2019-10-12 MED ORDER — GABAPENTIN 100 MG PO CAPS: 200.0000 mg | ORAL_CAPSULE | Freq: Two times a day (BID) | ORAL | 0 refills | Status: DC

## 2019-10-12 MED ORDER — ACETAMINOPHEN 500 MG PO TABS
1000.0000 mg | ORAL_TABLET | Freq: Three times a day (TID) | ORAL | 0 refills | Status: AC
Start: 2019-10-12 — End: 2019-10-17

## 2019-10-12 MED ORDER — METHOCARBAMOL 500 MG PO TABS: 500.0000 mg | ORAL_TABLET | Freq: Three times a day (TID) | ORAL | 0 refills | Status: AC | PRN

## 2019-10-12 MED FILL — traMADol HCL 50 MG TABS: 50 | 2 days supply | Qty: 10 | Fill #0

## 2019-10-12 MED FILL — PANTOPRAZOLE SOD DR 40 MG T: 40 | 90 days supply | Qty: 90 | Fill #0

## 2019-10-12 MED FILL — ONDANSETRON ODT 4 MG TABLET: 4 | 5 days supply | Qty: 20 | Fill #0

## 2019-10-12 MED FILL — METHOCARBAMOL 500 MG TABS: 500 | 7 days supply | Qty: 21 | Fill #0

## 2019-10-12 MED FILL — GABAPENTIN 100 MG CAPSULE: 100 | 5 days supply | Qty: 20 | Fill #0

## 2019-10-12 NOTE — Progress Notes (Signed)
Discharge instructions discussed with patient, verbalized agreement and understanding 

## 2019-10-12 NOTE — Progress Notes (Signed)
Patient alert and oriented, pain is controlled. Patient is tolerating fluids, advanced to protein shake today, patient is tolerating well.  Reviewed Gastric sleeve discharge instructions with patient and patient is able to articulate understanding.  Provided information on BELT program, Support Group and [facility] outpatient pharmacy. All questions answered, will continue to monitor.  Total fluid intake 660

## 2019-10-12 NOTE — Progress Notes (Signed)
Patient alert and oriented, Post op day 2.  Provided support and encouragement.  Encouraged pulmonary toilet, ambulation and small sips of liquids.  All questions answered.  Will continue to monitor. 

## 2019-10-12 NOTE — Discharge Summary (Addendum)
Physician Discharge Summary  Wanda Peters JEH:[id number] DOB: 1976/03/30 DOA: 10/10/2019  PCP: Jinny Sanders, MD  Admit date: 10/10/2019 Discharge date: 10/12/2019  Recommendations for Outpatient Follow-up:   Follow-up Information    Greer Pickerel, MD. Go on 10/27/2019.   Specialty: General Surgery Why: at 845 am.  Please arrive 15 minutes prior to your appointment time.  Thank  you Contact information: [address] [place] [address] [phone]        Carlena Hurl, PA-C. Go on 11/29/2019.   Specialty: General Surgery Why: at 130 pm.  Please arrive 15 minutes prior to your appointment time.  Thank you. Contact information: [place] [address] [phone]          Discharge Diagnoses:  Principal Problem:   Severe obesity ([place]) Active Problems:   Other hyperlipidemia   Chronic hip pain, bilateral   SUI (stress urinary incontinence, female)   S/P laparoscopic sleeve gastrectomy ABL anemia  Surgical Procedure: Laparoscopic Sleeve Gastrectomy with lap repair of small bowel mesentery , upper endoscopy  Discharge Condition: Good Disposition: Home  Diet recommendation: Postoperative sleeve gastrectomy diet (liquids only)  Filed Weights   10/10/19 0750  Weight: 119.8 kg     Hospital Course:  The patient was admitted for a planned laparoscopic sleeve gastrectomy. There was a trocar injury to the small bowel mesentery which resulted in about 300cc blood loss. It was repaired. Please see operative note for addl details.  Preoperatively the patient was given 5000 units of subcutaneous heparin for DVT prophylaxis. Postoperative prophylactic Lovenox dosing was not given due to risk of bleeding. The patient ambulated multiple times and wore SCDs. ERAS protocol was used. On the evening of postoperative day 0, the patient was started on water and ice chips. On postoperative day 1 the patient had no fever or tachycardia and was  tolerating water in their diet was gradually advanced throughout the day. The patient was ambulating without difficulty. Their vital signs are stable without fever or tachycardia. Their hemoglobin had drifted down a little on POD 1 so she was kept for additional monitoring. rOn pod2, her pain was better, she felt better and looked better. Her hgb had remained stable overnight. The patient had received discharge instructions and counseling. They were deemed stable for discharge and had met discharge criteria  BP 133/85 (BP Location: Right Arm)   Pulse 68   Temp 98.7 F (37.1 C) (Oral)   Resp 18   Ht 5' 6"  (1.676 m)   Wt 119.8 kg   LMP 09/28/2019   SpO2 98%   BMI 42.64 kg/m   Gen: alert, NAD, non-toxic appearing Pupils: equal, no scleral icterus Pulm: Lungs clear to auscultation, symmetric chest rise CV: regular rate and rhythm Abd: soft, approp tender, nondistended.  No cellulitis. No incisional hernia Ext: no edema, no calf tenderness Skin: no rash, no jaundice   Discharge Instructions  Discharge Instructions    Ambulate hourly while awake   Complete by: As directed    Call MD for:  difficulty breathing, headache or visual disturbances   Complete by: As directed    Call MD for:  persistant dizziness or light-headedness   Complete by: As directed    Call MD for:  persistant nausea and vomiting   Complete by: As directed    Call MD for:  redness, tenderness, or signs of infection (pain, swelling, redness, odor or green/yellow discharge around incision site)   Complete by:  As directed    Call MD for:  severe uncontrolled pain   Complete by: As directed    Call MD for:  temperature >101 F   Complete by: As directed    Diet bariatric full liquid   Complete by: As directed    Discharge instructions   Complete by: As directed    See bariatric discharge instructions   Incentive spirometry   Complete by: As directed    Perform hourly while awake     Allergies as of 10/12/2019       Reactions   Sulfonamide Derivatives Rash      Medication List    STOP taking these medications   hydrochlorothiazide 25 MG tablet Commonly known as: HYDRODIURIL   ketoconazole 2 % cream Commonly known as: NIZORAL   Lo Loestrin Fe 1 MG-10 MCG / 10 MCG tablet Generic drug: Norethindrone-Ethinyl Estradiol-Fe Biphas     TAKE these medications   acetaminophen 500 MG tablet Commonly known as: TYLENOL Take 2 tablets (1,000 mg total) by mouth every 8 (eight) hours for 5 days.   escitalopram 10 MG tablet Commonly known as: Lexapro Take 1 tablet (10 mg total) by mouth daily.   fexofenadine 180 MG tablet Commonly known as: ALLEGRA Take 180 mg by mouth daily.   fluticasone 50 MCG/ACT nasal spray Commonly known as: FLONASE Place 1 spray into both nostrils daily.   gabapentin 100 MG capsule Commonly known as: NEURONTIN Take 2 capsules (200 mg total) by mouth every 12 (twelve) hours.   methocarbamol 500 MG tablet Commonly known as: Robaxin Take 1 tablet (500 mg total) by mouth every 8 (eight) hours as needed for up to 7 days for muscle spasms.   ondansetron 4 MG disintegrating tablet Commonly known as: ZOFRAN-ODT Take 1 tablet (4 mg total) by mouth every 6 (six) hours as needed for nausea or vomiting.   pantoprazole 40 MG tablet Commonly known as: PROTONIX Take 1 tablet (40 mg total) by mouth daily.   polyethylene glycol 17 g packet Commonly known as: MIRALAX / GLYCOLAX Take 17 g by mouth daily.   traMADol 50 MG tablet Commonly known as: ULTRAM Take 1 tablet (50 mg total) by mouth every 6 (six) hours as needed (pain).   Vitamin D 125 MCG (5000 UT) Caps Take 5,000 Units by mouth daily.      Follow-up Information    Greer Pickerel, MD. Go on 10/27/2019.   Specialty: General Surgery Why: at 845 am.  Please arrive 15 minutes prior to your appointment time.  Thank  you Contact information: [address] [place] [address] [phone]         Carlena Hurl, PA-C. Go on 11/29/2019.   Specialty: General Surgery Why: at 130 pm.  Please arrive 15 minutes prior to your appointment time.  Thank you. Contact information: [address] [place] [address] [phone]            The results of significant diagnostics from this hospitalization (including imaging, microbiology, ancillary and laboratory) are listed below for reference.    Significant Diagnostic Studies: No results found.  Labs: Basic Metabolic Panel: Recent Labs  Lab 10/11/19 0435 10/12/19 0421  NA 139 141  K 4.3 4.1  CL 109 111  CO2 24 24  GLUCOSE 133* 118*  BUN 11 9  CREATININE 0.59 0.60  CALCIUM 8.4* 8.4*   Liver Function Tests: Recent Labs  Lab 10/11/19 0435  AST 32  ALT 44  ALKPHOS 65  BILITOT 0.5  PROT 6.2*  ALBUMIN 3.4*    CBC: Recent Labs  Lab 10/10/19 1311 10/10/19 1944 10/11/19 0435 10/11/19 1556 10/12/19 0421  WBC  --  10.4 8.8  --  7.3  NEUTROABS  --   --  6.7  --  5.0  HGB 12.0 12.1 11.2* 10.7* 10.5*  HCT 37.3 38.0 34.9* 33.7* 33.3*  MCV  --  99.2 99.7  --  100.9*  PLT  --  191 199  --  171    CBG: No results for input(s): GLUCAP in the last 168 hours.  Principal Problem:   Severe obesity ([place]) Active Problems:   Other hyperlipidemia   Chronic hip pain, bilateral   SUI (stress urinary incontinence, female)   S/P laparoscopic sleeve gastrectomy   Time coordinating discharge: 15 min  Signed:  Gayland Curry, MD [facility] Surgery, [place] [phone] 10/12/2019, 8:44 AM

## 2019-10-13 LAB — BPAM RBC
Blood Product Expiration Date: 202105102359
Unit Type and Rh: 9500

## 2019-10-13 LAB — TYPE AND SCREEN
ABO/RH(D): O POS
Antibody Screen: NEGATIVE
Unit division: 0

## 2019-10-14 ENCOUNTER — Other Ambulatory Visit: Payer: Self-pay | Admitting: *Deleted

## 2019-10-14 ENCOUNTER — Encounter: Payer: Self-pay | Admitting: *Deleted

## 2019-10-14 NOTE — Patient Outreach (Signed)
[facility]) Care Management  10/14/2019  WAVER EICHSTADT Nov 12, 1975 [id number]   Transition of care call/case closure   Referral received:10/10/19 Initial outreach: 10/12/19 Insurance: [place] [employer]    Subjective: Initial successful telephone call to patient's preferred number in order to complete transition of care assessment; 2 HIPAA identifiers verified. Explained purpose of call and completed transition of care assessment.  Winsome states she is doing pretty good.  denies post-operative problems, says surgical incisions unremarkable except 2 small areas after bandage removed with blister appearance she thinks related to adhesive. Reviewed notifying MD sooner for signs symptoms of infection or concern. She  states surgical pain well managed with prescribed medications. She reports tolerating Bariatric liquid diet, meeting fluid goal and protein goal , she denies nausea. She  denies bowel or bladder problems. She continues to use her incentive spirometry as recommended, she is tolerating mobility in home and leisure walk outside.  Spouse/son  are assisting with her recovery.  She does not have the hospital indemnity She uses a [facility] outpatient [facility] at [facility].  .    Objective:  Mrs. Lemus  was hospitalized at [facility] from 5/3-10/12/19 for Laparoscopic gastric sleeve resection  Comorbidities include: Severe Obesity, chronic hip pain She was discharged to home on 10/12/19 without the need for home health services or DME.   Assessment:  Patient voices good understanding of all discharge instructions.  See transition of care flowsheet for assessment details.   Plan:  Reviewed hospital discharge diagnosis of  Laparoscopic gastric sleeve resection  and discharge treatment plan using hospital discharge instructions, assessing medication adherence, reviewing problems requiring provider notification, and discussing the importance of follow up with  surgeon, primary care provider and/or specialists as directed.  No ongoing care management needs identified so will close case to [facility] services and route successful outreach letter with [place] [facility] pamphlet and 24 Hour Nurse Line Magnet to [place] [facility] clinical pool to be mailed to patient's home address.  Thanked patient for their services to [facility].   Joylene Draft, RN, BSN  [place] Management Coordinator  [phone]- Mobile [phone]- Toll Free Main Office

## 2019-10-17 ENCOUNTER — Telehealth (HOSPITAL_COMMUNITY): Payer: Self-pay

## 2019-10-17 ENCOUNTER — Other Ambulatory Visit: Payer: Self-pay | Admitting: General Surgery

## 2019-10-17 ENCOUNTER — Telehealth: Payer: Self-pay | Admitting: General Surgery

## 2019-10-17 ENCOUNTER — Ambulatory Visit
Admission: RE | Admit: 2019-10-17 | Discharge: 2019-10-17 | Disposition: A | Discharge status: Home / Self Care | Payer: 59 | Source: Ambulatory Visit | Attending: General Surgery | Admitting: General Surgery

## 2019-10-17 ENCOUNTER — Other Ambulatory Visit: Payer: Self-pay

## 2019-10-17 DIAGNOSIS — R42 Dizziness and giddiness: Secondary | ICD-10-CM | POA: Diagnosis not present

## 2019-10-17 LAB — CBC WITH DIFFERENTIAL/PLATELET
Abs Immature Granulocytes: 0.03 10*3/uL (ref 0.00–0.07)
Basophils Absolute: 0 10*3/uL (ref 0.0–0.1)
Basophils Relative: 0 %
Eosinophils Absolute: 0.1 10*3/uL (ref 0.0–0.5)
Eosinophils Relative: 2 %
HCT: 36.8 % (ref 36.0–46.0)
Hemoglobin: 12.8 g/dL (ref 12.0–15.0)
Immature Granulocytes: 0 %
Lymphocytes Relative: 25 %
Lymphs Abs: 1.9 10*3/uL (ref 0.7–4.0)
MCH: 32.1 pg (ref 26.0–34.0)
MCHC: 34.8 g/dL (ref 30.0–36.0)
MCV: 92.2 fL (ref 80.0–100.0)
Monocytes Absolute: 0.6 10*3/uL (ref 0.1–1.0)
Monocytes Relative: 8 %
Neutro Abs: 5 10*3/uL (ref 1.7–7.7)
Neutrophils Relative %: 65 %
Platelets: 286 10*3/uL (ref 150–400)
RBC: 3.99 MIL/uL (ref 3.87–5.11)
RDW: 12.2 % (ref 11.5–15.5)
WBC: 7.7 10*3/uL (ref 4.0–10.5)
nRBC: 0 % (ref 0.0–0.2)

## 2019-10-17 LAB — COMPREHENSIVE METABOLIC PANEL
ALT: 52 U/L — ABNORMAL HIGH (ref 0–44)
AST: 34 U/L (ref 15–41)
Albumin: 4 g/dL (ref 3.5–5.0)
Alkaline Phosphatase: 86 U/L (ref 38–126)
Anion gap: 8 (ref 5–15)
BUN: 21 mg/dL — ABNORMAL HIGH (ref 6–20)
CO2: 25 mmol/L (ref 22–32)
Calcium: 8.8 mg/dL — ABNORMAL LOW (ref 8.9–10.3)
Chloride: 105 mmol/L (ref 98–111)
Creatinine, Ser: 0.54 mg/dL (ref 0.44–1.00)
GFR calc Af Amer: >60 mL/min (ref >60)
GFR calc non Af Amer: >60 mL/min (ref >60)
Glucose, Bld: 86 mg/dL (ref 70–99)
Potassium: 4.1 mmol/L (ref 3.5–5.1)
Sodium: 138 mmol/L (ref 135–145)
Total Bilirubin: 0.5 mg/dL (ref 0.3–1.2)
Total Protein: 7.3 g/dL (ref 6.5–8.1)

## 2019-10-17 MED ORDER — THIAMINE HCL 100 MG/ML IJ SOLN
Freq: Once | INTRAVENOUS | Status: AC
  Filled 2019-10-17: qty 1000

## 2019-10-17 MED ORDER — ACETAMINOPHEN 325 MG PO TABS: 325.0000 mg | ORAL_TABLET | ORAL | Status: DC | PRN

## 2019-10-17 MED ORDER — ACETAMINOPHEN 160 MG/5ML PO SOLN
325.0000 mg | ORAL | Status: DC | PRN
  Filled 2019-10-17: qty 20.3

## 2019-10-17 MED ORDER — ACETAMINOPHEN 325 MG RE SUPP
325.0000 mg | RECTAL | Status: DC | PRN
  Filled 2019-10-17: qty 2

## 2019-10-17 NOTE — Telephone Encounter (Signed)
Patient called in stating that she had no fever or nausea or vomiting but she was feeling clammy and dizzy on standing for the past 24 hours.  Resting heart rate is around 60.  When she stands she states that her watch will tell her heart rates in the low 100s.  Getting in approximately 60 ounces of liquid per day as well as 50 to 60 g of protein per day.  Having daily bowel movements.  During surgery there was an injury to her small bowel mesentery that resulted in a fair amount of blood loss.  Her hemoglobin was stable on the day of discharge.  It is possible that she could have symptomatic anemia at this point  Recommended that we arrange IV fluids for her as well as labs.  I do not think we necessarily need to bring her back to the hospital.  She is afebrile and tolerating liquids and meeting her fluid intake.  Patient voiced understanding we will arrange IV fluids and labs at [facility] day surgery  Leighton Ruff. Redmond Pulling, MD, FACS General, Bariatric, & Minimally Invasive Surgery [facility], [place]

## 2019-10-17 NOTE — Telephone Encounter (Signed)
Patient called to discuss post bariatric surgery follow up questions.  See below:   1.  Tell me about your pain and pain management?some pain left should no longer taking ultram just utilizing tylenol gabapentin.  2.  Let's talk about fluid intake.  How much total fluid are you taking in?64-70 ounces of water  3.  How much protein have you taken in the last 2 days?80 grams  4.  Have you had nausea?  Tell me about when have experienced nausea and what you did to help?denies  5.  Has the frequency or color changed with your urine?clear  6.  Tell me what your incisions look like?had some blisters around bandaids but steri strips look fine  7.  Have you been passing gas? BM?has had multiple episodes of diarrhea yesterday 5/9 and this am 5/10  8.  If a problem or question were to arise who would you call?  Do you know contact numbers for [place], [facility], and [facility]?aware of how to contact all services  9.  How has the walking going?walking regularly  10.  How are your vitamins and calcium going?  How are you taking them?no problems with mvi or calcium

## 2019-10-18 ENCOUNTER — Ambulatory Visit: Payer: 59

## 2019-10-25 ENCOUNTER — Encounter (INDEPENDENT_AMBULATORY_CARE_PROVIDER_SITE_OTHER): Payer: Self-pay | Admitting: Family Medicine

## 2019-10-25 ENCOUNTER — Other Ambulatory Visit: Payer: Self-pay

## 2019-10-25 ENCOUNTER — Encounter: Payer: 59 | Attending: General Surgery | Admitting: Skilled Nursing Facility1

## 2019-10-25 ENCOUNTER — Ambulatory Visit (INDEPENDENT_AMBULATORY_CARE_PROVIDER_SITE_OTHER): Payer: 59 | Admitting: Family Medicine

## 2019-10-25 VITALS — BP 95/65 | HR 66 | Temp 97.5°F | Ht 66.0 in | Wt 241.0 lb

## 2019-10-25 DIAGNOSIS — E8881 Metabolic syndrome: Secondary | ICD-10-CM

## 2019-10-25 DIAGNOSIS — F3289 Other specified depressive episodes: Secondary | ICD-10-CM

## 2019-10-25 DIAGNOSIS — Z6837 Body mass index (BMI) 37.0-37.9, adult: Secondary | ICD-10-CM | POA: Insufficient documentation

## 2019-10-25 DIAGNOSIS — Z6839 Body mass index (BMI) 39.0-39.9, adult: Secondary | ICD-10-CM

## 2019-10-25 DIAGNOSIS — Z9189 Other specified personal risk factors, not elsewhere classified: Secondary | ICD-10-CM | POA: Diagnosis not present

## 2019-10-25 MED ORDER — ESCITALOPRAM OXALATE 10 MG PO TABS: 10.0000 mg | ORAL_TABLET | Freq: Every day | ORAL | 0 refills | Status: DC

## 2019-10-25 NOTE — Progress Notes (Signed)
Chief Complaint:   OBESITY Wanda Peters is here to discuss her progress with her obesity treatment plan along with follow-up of her obesity related diagnoses. Wanda Peters is on practicing portion control and making smarter food choices, such as increasing vegetables and decreasing simple carbohydrates and states she is following her eating plan approximately 0% of the time. Wanda Peters states she is walking for 20 minutes 7 times per week.  Today's visit was #: 51 Starting weight: 281 lbs Starting date: 08/26/2017 Today's weight: 241 lbs Today's date: 10/25/2019 Total lbs lost to date: 40 Total lbs lost since last in-office visit: 13  Interim History: Wanda Peters is status post sleeve gastrectomy on 10/10/2019. She is on full liquids and will see the dietician today for possible advancement to soft foods. She is getting 105 grams of protein in per day with protein shakes and yogurt. She is on a bariatric multivitamin daily. She is doing very well but notes some fatigue from anemia post-op.  Subjective:   1. Insulin resistance  Wanda Peters has a diagnosis of insulin resistance based on her elevated fasting insulin level >5. She continues to work on diet and exercise to decrease her risk of diabetes. Wanda Peters denies polyphagia. She is having some hunger post-op because she is on full liquids.   Lab Results  Component Value Date   INSULIN 7.6 05/23/2019   INSULIN 7.9 04/19/2018   INSULIN 8.6 12/21/2017   INSULIN 10.3 08/26/2017   Lab Results  Component Value Date   HGBA1C 5.1 05/23/2019     2. Other depression, with emotional eating  Wanda Peters's mood is stable. She is on Lexapro for sleep. She reports sleeping well post-op.  3. At risk for dehydration Wanda Peters is at risk for dehydration due to recent sleeve gastrectomy.  Assessment/Plan:   1. Insulin resistance Wanda Peters will continue with advance diet per dietician, and will continue to work on weight loss, exercise, and  decreasing simple carbohydrates to help decrease the risk of diabetes. Wanda Peters agreed to follow-up with [name] as directed to closely monitor her progress.  2. Other depression, with emotional eating  Behavior modification techniques were discussed today to help Wanda Peters deal with her emotional/non-hunger eating behaviors. We will refill Lexapro for 90 days with no refills. Orders and follow up as documented in patient record.   - escitalopram (LEXAPRO) 10 MG tablet; Take 1 tablet (10 mg total) by mouth daily.  Dispense: 90 tablet; Refill: 0  3. At risk for dehydration Wanda Peters was given approximately 15 minutes dehydration prevention counseling today. Wanda Peters is at risk for dehydration due to recent sleeve gastrectomy.  She was encouraged to hydrate and monitor fluid status to avoid dehydration as well as weight loss plateaus.   4. Class 2 severe obesity with serious comorbidity and body mass index (BMI) of 39.0 to 39.9 in adult, unspecified obesity type [facility]) Wanda Peters is currently in the action stage of change. As such, her goal is to continue with weight loss efforts. She has agreed to practicing portion control and making smarter food choices, such as increasing vegetables and decreasing simple carbohydrates with 80 grams of protein daily.   Exercise goals: As is.  Behavioral modification strategies: planning for success.  Wanda Peters has agreed to follow-up with our clinic in 6 weeks. She was informed of the importance of frequent follow-up visits to maximize her success with intensive lifestyle modifications for her multiple health conditions.   Objective:   Blood pressure 95/65, pulse 66, temperature (!) 97.5 F (36.4 C), temperature  source Oral, height 5\' 6"  (1.676 m), weight 241 lb (109.3 kg), last menstrual period 09/28/2019, SpO2 100 %. Body mass index is 38.9 kg/m.  General: Cooperative, alert, well developed, in no acute distress. HEENT: Conjunctivae and lids  unremarkable. Cardiovascular: Regular rhythm.  Lungs: Normal work of breathing. Neurologic: No focal deficits.   Lab Results  Component Value Date   CREATININE 0.54 10/17/2019   BUN 21 (H) 10/17/2019   NA 138 10/17/2019   K 4.1 10/17/2019   CL 105 10/17/2019   CO2 25 10/17/2019   Lab Results  Component Value Date   ALT 52 (H) 10/17/2019   AST 34 10/17/2019   ALKPHOS 86 10/17/2019   BILITOT 0.5 10/17/2019   Lab Results  Component Value Date   HGBA1C 5.1 05/23/2019   HGBA1C 5.4 09/30/2018   HGBA1C 5.4 04/19/2018   HGBA1C 5.3 12/21/2017   HGBA1C 5.5 08/26/2017   Lab Results  Component Value Date   INSULIN 7.6 05/23/2019   INSULIN 7.9 04/19/2018   INSULIN 8.6 12/21/2017   INSULIN 10.3 08/26/2017   Lab Results  Component Value Date   TSH 4.03 07/14/2017   Lab Results  Component Value Date   CHOL 172 05/23/2019   HDL 50 05/23/2019   LDLCALC 107 (H) 05/23/2019   TRIG 83 05/23/2019   CHOLHDL 3.4 09/30/2018   Lab Results  Component Value Date   WBC 7.7 10/17/2019   HGB 12.8 10/17/2019   HCT 36.8 10/17/2019   MCV 92.2 10/17/2019   PLT 286 10/17/2019   No results found for: IRON, TIBC, FERRITIN  Attestation Statements:   Reviewed by clinician on day of visit: allergies, medications, problem list, medical history, surgical history, family history, social history, and previous encounter notes.   Wilhemena Durie, am acting as Location manager for [employer], FNP-C.  I have reviewed the above documentation for accuracy and completeness, and I agree with the above. -  Georgianne Fick, FNP

## 2019-10-26 ENCOUNTER — Encounter (INDEPENDENT_AMBULATORY_CARE_PROVIDER_SITE_OTHER): Payer: Self-pay | Admitting: Family Medicine

## 2019-10-26 NOTE — Progress Notes (Signed)
2 Week Post-Operative Nutrition Class   Patient was seen on 08/03/18 for Post-Operative Nutrition education at [facility]   Pt states she did need rehydration due to an injury in her surgery but states she is able to reach her fluid needs and protein needs and feels well with no concerns.    Surgery date: 10/10/2019 Surgery type: sleeve Start weight at [facility]: 257 Weight today: 244.6   Body Composition Scale 10/25/2019  Total Body Fat % 43.6  Visceral Fat 14  Fat-Free Mass % 56.3   Total Body Water % 42.6   Muscle-Mass lbs 33.2  Body Fat Displacement          Torso  lbs 66.2         Left Leg  lbs 13.2         Right Leg  lbs 13.2         Left Arm  lbs 6.6         Right Arm   lbs 6.6     The following the learning objectives were met by the patient during this course:  Identifies Phase 3 (Soft, High Proteins) Dietary Goals and will begin from 2 weeks post-operatively to 2 months post-operatively  Identifies appropriate sources of fluids and proteins   States protein recommendations and appropriate sources post-operatively  Identifies the need for appropriate texture modifications, mastication, and bite sizes when consuming solids  Identifies appropriate multivitamin and calcium sources post-operatively  Describes the need for physical activity post-operatively and will follow MD recommendations  States when to call healthcare provider regarding medication questions or post-operative complications   Handouts given during class include:  Phase 3A: Soft, High Protein Diet Handout   Follow-Up Plan: Patient will follow-up at [facility] in 6 weeks for 2 month post-op nutrition visit for diet advancement per MD.

## 2019-10-27 DIAGNOSIS — D649 Anemia, unspecified: Secondary | ICD-10-CM | POA: Diagnosis not present

## 2019-10-31 ENCOUNTER — Telehealth: Payer: Self-pay | Admitting: Skilled Nursing Facility1

## 2019-10-31 NOTE — Telephone Encounter (Signed)
RD called pt to verify fluid intake once starting soft, solid proteins 2 week post-bariatric surgery.   Daily Fluid intake: Daily Protein intake:  Concerns/issues:   LVM 

## 2019-12-06 ENCOUNTER — Encounter: Payer: 59 | Attending: General Surgery | Admitting: Skilled Nursing Facility1

## 2019-12-06 ENCOUNTER — Other Ambulatory Visit: Payer: Self-pay

## 2019-12-06 ENCOUNTER — Other Ambulatory Visit (INDEPENDENT_AMBULATORY_CARE_PROVIDER_SITE_OTHER): Payer: Self-pay | Admitting: Family Medicine

## 2019-12-06 ENCOUNTER — Ambulatory Visit (INDEPENDENT_AMBULATORY_CARE_PROVIDER_SITE_OTHER): Payer: 59 | Admitting: Family Medicine

## 2019-12-06 ENCOUNTER — Encounter (INDEPENDENT_AMBULATORY_CARE_PROVIDER_SITE_OTHER): Payer: Self-pay | Admitting: Family Medicine

## 2019-12-06 VITALS — BP 96/64 | HR 64 | Temp 97.8°F | Ht 66.0 in | Wt 228.0 lb

## 2019-12-06 DIAGNOSIS — E8881 Metabolic syndrome: Secondary | ICD-10-CM

## 2019-12-06 DIAGNOSIS — Z6836 Body mass index (BMI) 36.0-36.9, adult: Secondary | ICD-10-CM | POA: Diagnosis not present

## 2019-12-06 DIAGNOSIS — Z6837 Body mass index (BMI) 37.0-37.9, adult: Secondary | ICD-10-CM | POA: Diagnosis not present

## 2019-12-06 DIAGNOSIS — F3289 Other specified depressive episodes: Secondary | ICD-10-CM

## 2019-12-06 MED ORDER — ESCITALOPRAM OXALATE 10 MG PO TABS: 10.0000 mg | ORAL_TABLET | Freq: Every day | ORAL | 1 refills | Status: DC

## 2019-12-06 NOTE — Progress Notes (Signed)
Bariatric Nutrition Follow-Up Visit Medical Nutrition Therapy   Pt given star previously: YES  NUTRITION ASSESSMENT    Surgery date: 10/10/2019 Surgery type: sleeve Start weight at [facility]: 257 Weight today: 230.8   Body Composition Scale 10/25/2019 12/06/2019  Total Body Fat % 43.6 42  Visceral Fat 14 13  Fat-Free Mass % 56.3 57.9   Total Body Water % 42.6 43.4   Muscle-Mass lbs 33.2 33  Body Fat Displacement           Torso  lbs 66.2 60.2         Left Leg  lbs 13.2 12         Right Leg  lbs 13.2 12         Left Arm  lbs 6.6 6         Right Arm   lbs 6.6 6    Clinical  Medical hx: depression Medications:  Labs:    Lifestyle & Dietary Hx  Pt has no complaints. Pt states he starts BELT next week.   Estimated daily fluid intake: 64 oz Estimated daily protein intake: 80+ g Supplements: opurity and calcium  Current average weekly physical activity: walking 7 days a week 20-30 minutes   24-Hr Dietary Recall First Meal: coffee + fairlife + protein Snack: 1 egg or 2 cheese squares Second Meal: chicken or shrimp Snack: fairlife protein drink Third Meal:chicken + beans Snack: sugar free pocicle Beverages: water, coffee, gatorade zero  Post-Op Goals/ Signs/ Symptoms Using straws: no Drinking while eating: no Chewing/swallowing difficulties: no Changes in vision: no Changes to mood/headaches: no Hair loss/changes to skin/nails: no Difficulty focusing/concentrating: no Sweating: no Dizziness/lightheadedness: no Palpitations: no  Carbonated/caffeinated beverages: no N/V/D/C/Gas: taking miralax  Abdominal pain: no Dumping syndrome: no    NUTRITION DIAGNOSIS  Overweight/obesity ([place]-3.3) related to past poor dietary habits and physical inactivity as evidenced by completed bariatric surgery and following dietary guidelines for continued weight loss and healthy nutrition status.     NUTRITION INTERVENTION Nutrition counseling (C-1) and education (E-2) to facilitate  bariatric surgery goals, including: . Diet advancement to the next phase (phase 3) now including none starchy vegetables . The importance of consuming adequate calories as well as certain nutrients daily due to the body's need for essential vitamins, minerals, and fats . The importance of daily physical activity and to reach a goal of at least 150 minutes of moderate to vigorous physical activity weekly (or as directed by their physician) due to benefits such as increased musculature and improved lab values . The importance of intuitive eating specifically learning hunger-satiety cues and understanding the importance of learning a new body  Goals: -Continue to aim for a minimum of 64 fluid ounces 7 days a week with at least 30 ounces being plain water -Eat non-starchy vegetables 2 times a day 7 days a week -Start out with soft cooked vegetables today and tomorrow; if tolerated begin to eat raw vegetables or cooked including salads -Eat your 3 ounces of protein first then start in on your non-starchy vegetables; once you understand how much of your meal leads to satisfaction and not full while still eating 3 ounces of protein and non-starchy vegetables you can eat them in any order  -Continue to aim for 30 minutes of activity at least 5 times a week -Do NOT cook with/add to your food: alfredo sauce, cheese sauce, barbeque sauce, ketchup, fat back, butter, bacon grease, grease, Crisco, OR SUGAR   Handouts Provided Include   Phase  Learning Style & Readiness for Change Teaching method utilized: Visual & Auditory  Demonstrated degree of understanding via: Teach Back  Barriers to learning/adherence to lifestyle change: none identified   RD's Notes for Next Visit . Assess adherence to pt chosen goals   MONITORING & EVALUATION Dietary intake, weekly physical activity, body weight  Next Steps Patient is to follow-up in 3 months

## 2019-12-07 ENCOUNTER — Encounter (INDEPENDENT_AMBULATORY_CARE_PROVIDER_SITE_OTHER): Payer: Self-pay | Admitting: Family Medicine

## 2019-12-07 NOTE — Progress Notes (Signed)
Chief Complaint:   OBESITY Wanda Peters is here to discuss her progress with her obesity treatment plan along with follow-up of her obesity related diagnoses. Wanda Peters is on practicing portion control and making smarter food choices, such as increasing vegetables and decreasing simple carbohydrates and states she is following her eating plan approximately 100% of the time. Wanda Peters states she is walking for 30 minutes 7 times per week.  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 She is averaging 80 grams of protein per day. She is feeling more energetic and her hemoglobin has increased. She had a mesenteric injury during surgery which caused some anemia.  Subjective:   1. Other depression, with emotional eating  Wanda Peters's mood is stable though she admits to an adjustment period post-op.  2. Insulin resistance Wanda Peters has a diagnosis of insulin resistance based on her elevated fasting insulin level >5. She denies polyphagia, and she is not on metformin. She continues to work on diet and exercise to decrease her risk of diabetes.  Lab Results  Component Value Date   INSULIN 7.6 05/23/2019   INSULIN 7.9 04/19/2018   INSULIN 8.6 12/21/2017   INSULIN 10.3 08/26/2017   Lab Results  Component Value Date   HGBA1C 5.1 05/23/2019   Assessment/Plan:   1. Other depression, with emotional eating  Behavior modification techniques were discussed today to help Wanda Peters deal with her emotional/non-hunger eating behaviors. We will refill Lexapro for 90 days with 1 refill. Orders and follow up as documented in patient record.   - escitalopram (LEXAPRO) 10 MG tablet; Take 1 tablet (10 mg total) by mouth daily.  Dispense: 90 tablet; Refill: 1  2. Insulin  resistance Wanda Peters will continue her meal plan, and will continue to work on weight loss, exercise, and decreasing simple carbohydrates to help decrease the risk of diabetes. Wanda Peters agreed to follow-up with [name] as directed to closely monitor her progress.  3. Class 2 severe obesity with serious comorbidity and body mass index (BMI) of 36.0 to 36.9 in adult, unspecified obesity type [facility]) Wanda Peters is currently in the action stage of change. As such, her goal is to continue with weight loss efforts. She has agreed to practicing portion control and making smarter food choices, such as increasing vegetables and decreasing simple carbohydrates with 80 grams of protein daily.   Exercise goals: As is.  Behavioral modification strategies: increasing lean protein intake.  Wanda Peters has agreed to follow-up with our clinic in 4 months.   Objective:   Blood pressure 96/64, pulse 64, temperature 97.8 F (36.6 C), temperature source Oral, height 5\' 6"  (1.676 m), weight 228 lb (103.4 kg), SpO2 99 %. Body mass index is 36.8 kg/m.  General: Cooperative, alert, well developed, in no acute distress. HEENT: Conjunctivae and lids unremarkable. Cardiovascular: Regular rhythm.  Lungs: Normal work of breathing. Neurologic: No focal deficits.   Lab Results  Component Value Date   CREATININE 0.54 10/17/2019   BUN 21 (H) 10/17/2019   NA 138 10/17/2019   K 4.1 10/17/2019   CL 105 10/17/2019   CO2 25 10/17/2019   Lab Results  Component Value Date   ALT 52 (H) 10/17/2019   AST 34 10/17/2019   ALKPHOS 86 10/17/2019   BILITOT 0.5 10/17/2019   Lab Results  Component  Value Date   HGBA1C 5.1 05/23/2019   HGBA1C 5.4 09/30/2018   HGBA1C 5.4 04/19/2018   HGBA1C 5.3 12/21/2017   HGBA1C 5.5 08/26/2017   Lab Results  Component Value Date   INSULIN 7.6 05/23/2019   INSULIN 7.9 04/19/2018   INSULIN 8.6 12/21/2017   INSULIN 10.3 08/26/2017   Lab Results  Component Value Date   TSH 4.03 07/14/2017     Lab Results  Component Value Date   CHOL 172 05/23/2019   HDL 50 05/23/2019   LDLCALC 107 (H) 05/23/2019   TRIG 83 05/23/2019   CHOLHDL 3.4 09/30/2018   Lab Results  Component Value Date   WBC 7.7 10/17/2019   HGB 12.8 10/17/2019   HCT 36.8 10/17/2019   MCV 92.2 10/17/2019   PLT 286 10/17/2019   No results found for: IRON, TIBC, FERRITIN  Attestation Statements:   Reviewed by clinician on day of visit: allergies, medications, problem list, medical history, surgical history, family history, social history, and previous encounter notes.    Wilhemena Durie, am acting as Location manager for [employer], FNP-C.  I have reviewed the above documentation for accuracy and completeness, and I agree with the above. -  Georgianne Fick, FNP

## 2020-01-19 ENCOUNTER — Encounter: Payer: Self-pay | Admitting: Genetic Counselor

## 2020-02-20 ENCOUNTER — Encounter: Payer: 59 | Attending: General Surgery | Admitting: Skilled Nursing Facility1

## 2020-02-20 ENCOUNTER — Other Ambulatory Visit: Payer: Self-pay

## 2020-02-20 DIAGNOSIS — Z6837 Body mass index (BMI) 37.0-37.9, adult: Secondary | ICD-10-CM | POA: Diagnosis not present

## 2020-02-20 NOTE — Progress Notes (Signed)
Bariatric Nutrition Follow-Up Visit Medical Nutrition Therapy   Pt given star previously: YES  NUTRITION ASSESSMENT    Surgery date: 10/10/2019 Surgery type: sleeve Start weight at [facility]: 257 Weight today: 211.1   Body Composition Scale 10/25/2019 12/06/2019 02/20/2020  Total Body Fat % 43.6 42 39.6  Visceral Fat 14 13 11   Fat-Free Mass % 56.3 57.9 60.3   Total Body Water % 42.6 43.4 44.6   Muscle-Mass lbs 33.2 33 32.7  Body Fat Displacement            Torso  lbs 66.2 60.2 51.8         Left Leg  lbs 13.2 12 10.3         Right Leg  lbs 13.2 12 10.3         Left Arm  lbs 6.6 6 5.1         Right Arm   lbs 6.6 6 5.1    Clinical  Medical hx: depression Medications:  Labs:    Lifestyle & Dietary Hx  Pt has no complaints. Pt states she sometimes goes a little too long without eating so eating a little fast so trying to keep snacks on hand such as cheesestick. Pt states sometimes chicken is more challenging.   Estimated daily fluid intake: 64 oz Estimated daily protein intake: 80+ g Supplements: opurity and calcium  Current average weekly physical activity: personal trainer 1 day week 60 minutes; walking the other days    24-Hr Dietary Recall First Meal: coffee + fairlife + protein Snack: 1 egg or 2 cheese squares or cheesestick Second Meal: chicken or shrimp or salmon or [place] burger + salad or zucchini + squash Snack: fairlife protein drink or cheesestick or rest of lunch Third Meal:chicken + beans or salmon or ground beef Snack: sugar free pocicle Beverages: water, coffee, gatorade zero  Post-Op Goals/ Signs/ Symptoms Using straws: no Drinking while eating: no Chewing/swallowing difficulties: no Changes in vision: no Changes to mood/headaches: no Hair loss/changes to skin/nails: no Difficulty focusing/concentrating: no Sweating: no Dizziness/lightheadedness: no Palpitations: no  Carbonated/caffeinated beverages: no N/V/D/C/Gas: taking miralax every morning   Abdominal pain: no Dumping syndrome: no    NUTRITION DIAGNOSIS  Overweight/obesity ([place]-3.3) related to past poor dietary habits and physical inactivity as evidenced by completed bariatric surgery and following dietary guidelines for continued weight loss and healthy nutrition status.     NUTRITION INTERVENTION Nutrition counseling (C-1) and education (E-2) to facilitate bariatric surgery goals, including: . Diet advancement to the next phase (phase 3) now including starchy vegetables . The importance of consuming adequate calories as well as certain nutrients daily due to the body's need for essential vitamins, minerals, and fats . The importance of daily physical activity and to reach a goal of at least 150 minutes of moderate to vigorous physical activity weekly (or as directed by their physician) due to benefits such as increased musculature and improved lab values . The importance of intuitive eating specifically learning hunger-satiety cues and understanding the importance of learning a new body  Goals:    Handouts Provided Include   6 month handout  Learning Style & Readiness for Change Teaching method utilized: Visual & Auditory  Demonstrated degree of understanding via: Teach Back  Barriers to learning/adherence to lifestyle change: none identified   RD's Notes for Next Visit . Assess adherence to pt chosen goals   MONITORING & EVALUATION Dietary intake, weekly physical activity, body weight  Next Steps Patient is to follow-up in 3  months

## 2020-02-23 ENCOUNTER — Other Ambulatory Visit (HOSPITAL_COMMUNITY)
Admission: RE | Admit: 2020-02-23 | Discharge: 2020-02-23 | Disposition: A | Discharge status: Home / Self Care | Payer: 59 | Source: Ambulatory Visit

## 2020-03-05 DIAGNOSIS — Z135 Encounter for screening for eye and ear disorders: Secondary | ICD-10-CM | POA: Diagnosis not present

## 2020-03-05 DIAGNOSIS — H524 Presbyopia: Secondary | ICD-10-CM | POA: Diagnosis not present

## 2020-03-16 ENCOUNTER — Ambulatory Visit: Payer: 59 | Attending: Internal Medicine

## 2020-03-16 DIAGNOSIS — Z23 Encounter for immunization: Secondary | ICD-10-CM

## 2020-03-16 NOTE — Progress Notes (Signed)
   Covid-19 Vaccination Clinic  Name:  Wanda Peters    MRN: [id number] DOB: 07-23-1975  03/16/2020  [name] was observed post Covid-19 immunization for 15 minutes without incident. She was provided with Vaccine Information Sheet and instruction to access the V-Safe system.   [name] was instructed to call 911 with any severe reactions post vaccine: Marland Kitchen Difficulty breathing  . Swelling of face and throat  . A fast heartbeat  . A bad rash all over body  . Dizziness and weakness

## 2020-04-03 ENCOUNTER — Encounter (INDEPENDENT_AMBULATORY_CARE_PROVIDER_SITE_OTHER): Payer: Self-pay | Admitting: Family Medicine

## 2020-04-03 ENCOUNTER — Other Ambulatory Visit (INDEPENDENT_AMBULATORY_CARE_PROVIDER_SITE_OTHER): Payer: Self-pay | Admitting: Family Medicine

## 2020-04-03 ENCOUNTER — Ambulatory Visit (INDEPENDENT_AMBULATORY_CARE_PROVIDER_SITE_OTHER): Payer: 59 | Admitting: Family Medicine

## 2020-04-03 ENCOUNTER — Other Ambulatory Visit: Payer: Self-pay

## 2020-04-03 VITALS — BP 90/59 | HR 61 | Temp 98.0°F | Ht 66.0 in | Wt 207.0 lb

## 2020-04-03 DIAGNOSIS — E559 Vitamin D deficiency, unspecified: Secondary | ICD-10-CM

## 2020-04-03 DIAGNOSIS — E669 Obesity, unspecified: Secondary | ICD-10-CM | POA: Diagnosis not present

## 2020-04-03 DIAGNOSIS — R7401 Elevation of levels of liver transaminase levels: Secondary | ICD-10-CM | POA: Diagnosis not present

## 2020-04-03 DIAGNOSIS — E8881 Metabolic syndrome: Secondary | ICD-10-CM

## 2020-04-03 DIAGNOSIS — F3289 Other specified depressive episodes: Secondary | ICD-10-CM | POA: Diagnosis not present

## 2020-04-03 DIAGNOSIS — E7849 Other hyperlipidemia: Secondary | ICD-10-CM | POA: Diagnosis not present

## 2020-04-03 DIAGNOSIS — K909 Intestinal malabsorption, unspecified: Secondary | ICD-10-CM | POA: Diagnosis not present

## 2020-04-03 DIAGNOSIS — Z6833 Body mass index (BMI) 33.0-33.9, adult: Secondary | ICD-10-CM | POA: Diagnosis not present

## 2020-04-03 DIAGNOSIS — E66811 Obesity, class 1: Secondary | ICD-10-CM

## 2020-04-03 DIAGNOSIS — E88819 Insulin resistance, unspecified: Secondary | ICD-10-CM

## 2020-04-03 MED ORDER — ESCITALOPRAM OXALATE 10 MG PO TABS: 10.0000 mg | ORAL_TABLET | Freq: Every day | ORAL | 1 refills | Status: DC

## 2020-04-04 NOTE — Progress Notes (Addendum)
Chief Complaint:   Wanda Peters is here to discuss her progress with her obesity treatment plan along with follow-up of her obesity related diagnoses. Wanda Peters is practicing portion control and making smarter food choices, such as increasing vegetables and decreasing simple carbohydrates and states she is following her eating plan approximately 100% of the time. Wanda Peters states she is walking 45 minutes 6 times per week.  Today's visit was #: 60 Starting weight: 281 lbs Starting date: 08/26/2017 Today's weight: 207 lbs Today's date: 04/03/2020 Total lbs lost to date: 74 Total lbs lost since last in-office visit: 21  Interim History: Wanda Peters is status post gastric sleeve 10/10/2019 and is down 21 lbs since her last office visit 4 months ago. She has lost 57 lbs since her sleeve gastrectomy. She is meeting her protein goals and always eats protein first. She is on once a day bariatric vitamins, TUMS TID, and Vitamin D gummy 2 daily. She will follow-up with Dr. Redmond Pulling next week.  Subjective:   Vitamin D deficiency. Wanda Peters is on Vitamin D gummy 2 daily.   Ref. Range 05/23/2019 08:38  Vitamin D, 25-Hydroxy Latest Ref Range: 30.0 - 100.0 ng/mL 36.0   Other depression, with emotional eating.  Wanda Peters reports mood is stable. She denies cravings. Lexapro is actually to help with sleep versus depression.  Insulin resistance.  A1c is within normal limits. Wanda Peters is not on metformin.  Lab Results  Component Value Date   INSULIN 7.6 05/23/2019   INSULIN 7.9 04/19/2018   INSULIN 8.6 12/21/2017   INSULIN 10.3 08/26/2017   Other hyperlipidemia. LDL elevated at 107; triglycerides and HDL within normal limits. Wanda Peters is not on a statin.  Intestinal malabsorption, unspecified type. 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 She is doing a great job with her diet and is compliant with bariatric multivitamin, calcium and Vitamin D.  Assessment/Plan:    Vitamin D deficiency.  VITAMIN D 25 Hydroxy (Vit-D Deficiency, Fractures) level will be checked today.   Other depression, with emotional eating. Marland Kitchen Refill was given for escitalopram (LEXAPRO) 10 MG tablet daily #90 with 1 refill.  Insulin resistance. Hemoglobin A1c, Insulin, random levels will be checked today.  Other hyperlipidemia. . Lipid Panel With LDL/HDL Ratio will be checked today.  Intestinal malabsorption, unspecified type. Labs will be checked today.  Class 1 obesity with serious comorbidity and body mass index (BMI) of 33.0 to 33.9 in adult, unspecified obesity type.  Wanda Peters is currently in the action stage of change. As such, her goal is to continue with weight loss efforts. She has agreed to practicing portion control and making smarter food choices, such as increasing vegetables and decreasing simple carbohydrates and will eat protein first.   Exercise goals: Wanda Peters will continue her current exercise regimen.   Behavioral modification strategies: planning for success.  Wanda Peters has agreed to follow-up with our clinic in 6 months. Wanda Peters was informed we would discuss her lab results at her next visit unless there is a critical issue that needs to be addressed sooner. Wanda Peters agreed to keep her next visit at the agreed upon time to discuss these results.  Objective:   Blood pressure (!) 90/59, pulse 61, temperature 98 F (36.7 C), height 5\' 6"  (1.676 m), weight 207 lb (93.9 kg), SpO2 99 %. Body mass index is 33.41 kg/m.  General: Cooperative, alert, well developed, in no acute distress. HEENT: Conjunctivae and lids unremarkable. Cardiovascular: Regular rhythm.  Lungs: Normal work of breathing. Neurologic:  No focal deficits.   Lab Results  Component Value Date   HGBA1C 5.1 05/23/2019   HGBA1C 5.4 09/30/2018   HGBA1C 5.4 04/19/2018   HGBA1C 5.3 12/21/2017   Lab Results  Component Value Date   INSULIN 7.6 05/23/2019   INSULIN 7.9 04/19/2018    INSULIN 8.6 12/21/2017   INSULIN 10.3 08/26/2017   Lab Results  Component Value Date   TSH 4.03 07/14/2017   Attestation Statements:   Reviewed by clinician on day of visit: allergies, medications, problem list, medical history, surgical history, family history, social history, and previous encounter notes.  IMichaelene Song, am acting as Location manager for [employer], FNP-C   I have reviewed the above documentation for accuracy and completeness, and I agree with the above. -  Georgianne Fick, FNP

## 2020-04-08 LAB — FERRITIN: Ferritin: 110 ng/mL (ref 15–150)

## 2020-04-08 LAB — LIPID PANEL WITH LDL/HDL RATIO
Cholesterol, Total: 177 mg/dL (ref 100–199)
HDL: 59 mg/dL (ref >39)
LDL Chol Calc (NIH): 103 mg/dL — ABNORMAL HIGH (ref 0–99)
LDL/HDL Ratio: 1.7 ratio (ref 0.0–3.2)
Triglycerides: 80 mg/dL (ref 0–149)
VLDL Cholesterol Cal: 15 mg/dL (ref 5–40)

## 2020-04-08 LAB — CBC WITH DIFFERENTIAL/PLATELET
Basophils Absolute: 0.1 10*3/uL (ref 0.0–0.2)
Basos: 1 %
EOS (ABSOLUTE): 0.2 10*3/uL (ref 0.0–0.4)
Eos: 3 %
Hematocrit: 39.3 % (ref 34.0–46.6)
Hemoglobin: 13.3 g/dL (ref 11.1–15.9)
Immature Grans (Abs): 0 10*3/uL (ref 0.0–0.1)
Immature Granulocytes: 0 %
Lymphocytes Absolute: 1.6 10*3/uL (ref 0.7–3.1)
Lymphs: 27 %
MCH: 32.8 pg (ref 26.6–33.0)
MCHC: 33.8 g/dL (ref 31.5–35.7)
MCV: 97 fL (ref 79–97)
Monocytes Absolute: 0.4 10*3/uL (ref 0.1–0.9)
Monocytes: 8 %
Neutrophils Absolute: 3.5 10*3/uL (ref 1.4–7.0)
Neutrophils: 61 %
Platelets: 234 10*3/uL (ref 150–450)
RBC: 4.05 x10E6/uL (ref 3.77–5.28)
RDW: 12.2 % (ref 11.7–15.4)
WBC: 5.7 10*3/uL (ref 3.4–10.8)

## 2020-04-08 LAB — IRON AND TIBC
Iron Saturation: 41 % (ref 15–55)
Iron: 109 ug/dL (ref 27–159)
Total Iron Binding Capacity: 268 ug/dL (ref 250–450)
UIBC: 159 ug/dL (ref 131–425)

## 2020-04-08 LAB — COMPREHENSIVE METABOLIC PANEL
ALT: 19 IU/L (ref 0–32)
AST: 21 IU/L (ref 0–40)
Albumin/Globulin Ratio: 1.7 (ref 1.2–2.2)
Albumin: 4.2 g/dL (ref 3.8–4.8)
Alkaline Phosphatase: 82 IU/L (ref 44–121)
BUN/Creatinine Ratio: 18 (ref 9–23)
BUN: 12 mg/dL (ref 6–24)
Bilirubin Total: 0.4 mg/dL (ref 0.0–1.2)
CO2: 22 mmol/L (ref 20–29)
Calcium: 9.1 mg/dL (ref 8.7–10.2)
Chloride: 102 mmol/L (ref 96–106)
Creatinine, Ser: 0.66 mg/dL (ref 0.57–1.00)
GFR calc Af Amer: 123 mL/min/{1.73_m2} (ref >59)
GFR calc non Af Amer: 106 mL/min/{1.73_m2} (ref >59)
Globulin, Total: 2.5 g/dL (ref 1.5–4.5)
Glucose: 80 mg/dL (ref 65–99)
Potassium: 4.4 mmol/L (ref 3.5–5.2)
Sodium: 139 mmol/L (ref 134–144)
Total Protein: 6.7 g/dL (ref 6.0–8.5)

## 2020-04-08 LAB — VITAMIN D 25 HYDROXY (VIT D DEFICIENCY, FRACTURES): Vit D, 25-Hydroxy: 63.7 ng/mL (ref 30.0–100.0)

## 2020-04-08 LAB — INSULIN, RANDOM: INSULIN: 4.1 u[IU]/mL (ref 2.6–24.9)

## 2020-04-08 LAB — HEMOGLOBIN A1C
Est. average glucose Bld gHb Est-mCnc: 103 mg/dL
Hgb A1c MFr Bld: 5.2 % (ref 4.8–5.6)

## 2020-04-08 LAB — FOLATE: Folate: >20 ng/mL (ref >3.0)

## 2020-04-08 LAB — VITAMIN B1: Thiamine: 124.5 nmol/L (ref 66.5–200.0)

## 2020-04-08 LAB — VITAMIN B12: Vitamin B-12: 642 pg/mL (ref 232–1245)

## 2020-04-10 ENCOUNTER — Encounter (INDEPENDENT_AMBULATORY_CARE_PROVIDER_SITE_OTHER): Payer: Self-pay

## 2020-04-12 ENCOUNTER — Other Ambulatory Visit: Payer: Self-pay

## 2020-04-12 ENCOUNTER — Ambulatory Visit
Admission: RE | Admit: 2020-04-12 | Discharge: 2020-04-12 | Disposition: A | Discharge status: Home / Self Care | Payer: 59 | Source: Ambulatory Visit | Attending: General Surgery | Admitting: General Surgery

## 2020-04-12 DIAGNOSIS — R1030 Lower abdominal pain, unspecified: Secondary | ICD-10-CM | POA: Insufficient documentation

## 2020-04-12 DIAGNOSIS — R42 Dizziness and giddiness: Secondary | ICD-10-CM | POA: Insufficient documentation

## 2020-04-12 LAB — COMPREHENSIVE METABOLIC PANEL
ALT: 12 U/L (ref 0–44)
AST: 15 U/L (ref 15–41)
Albumin: 3.6 g/dL (ref 3.5–5.0)
Alkaline Phosphatase: 49 U/L (ref 38–126)
Anion gap: 8 (ref 5–15)
BUN: 19 mg/dL (ref 6–20)
CO2: 27 mmol/L (ref 22–32)
Calcium: 8.7 mg/dL — ABNORMAL LOW (ref 8.9–10.3)
Chloride: 103 mmol/L (ref 98–111)
Creatinine, Ser: 0.98 mg/dL (ref 0.44–1.00)
GFR, Estimated: >60 mL/min (ref >60)
Glucose, Bld: 84 mg/dL (ref 70–99)
Potassium: 4.1 mmol/L (ref 3.5–5.1)
Sodium: 138 mmol/L (ref 135–145)
Total Bilirubin: 0.7 mg/dL (ref 0.3–1.2)
Total Protein: 6.5 g/dL (ref 6.5–8.1)

## 2020-04-12 LAB — CBC WITH DIFFERENTIAL/PLATELET
Abs Immature Granulocytes: 0.02 10*3/uL (ref 0.00–0.07)
Basophils Absolute: 0 10*3/uL (ref 0.0–0.1)
Basophils Relative: 1 %
Eosinophils Absolute: 0.1 10*3/uL (ref 0.0–0.5)
Eosinophils Relative: 1 %
HCT: 36.4 % (ref 36.0–46.0)
Hemoglobin: 12.2 g/dL (ref 12.0–15.0)
Immature Granulocytes: 0 %
Lymphocytes Relative: 29 %
Lymphs Abs: 2 10*3/uL (ref 0.7–4.0)
MCH: 32.6 pg (ref 26.0–34.0)
MCHC: 33.5 g/dL (ref 30.0–36.0)
MCV: 97.3 fL (ref 80.0–100.0)
Monocytes Absolute: 0.6 10*3/uL (ref 0.1–1.0)
Monocytes Relative: 8 %
Neutro Abs: 4.2 10*3/uL (ref 1.7–7.7)
Neutrophils Relative %: 61 %
Platelets: 208 10*3/uL (ref 150–400)
RBC: 3.74 MIL/uL — ABNORMAL LOW (ref 3.87–5.11)
RDW: 12.6 % (ref 11.5–15.5)
WBC: 7 10*3/uL (ref 4.0–10.5)
nRBC: 0 % (ref 0.0–0.2)

## 2020-04-12 MED ORDER — THIAMINE HCL 100 MG/ML IJ SOLN
Freq: Once | INTRAVENOUS | Status: AC
  Filled 2020-04-12: qty 1000

## 2020-04-12 MED ORDER — SODIUM CHLORIDE 0.9 % IV SOLN: Freq: Once | INTRAVENOUS | Status: AC

## 2020-04-13 ENCOUNTER — Other Ambulatory Visit: Payer: Self-pay | Admitting: Obstetrics and Gynecology

## 2020-04-13 DIAGNOSIS — R102 Pelvic and perineal pain: Secondary | ICD-10-CM | POA: Diagnosis not present

## 2020-04-18 DIAGNOSIS — E669 Obesity, unspecified: Secondary | ICD-10-CM | POA: Diagnosis not present

## 2020-04-18 DIAGNOSIS — Z9884 Bariatric surgery status: Secondary | ICD-10-CM | POA: Diagnosis not present

## 2020-05-29 ENCOUNTER — Encounter: Payer: 59 | Attending: General Surgery | Admitting: Skilled Nursing Facility1

## 2020-05-29 ENCOUNTER — Other Ambulatory Visit: Payer: Self-pay

## 2020-05-29 DIAGNOSIS — E669 Obesity, unspecified: Secondary | ICD-10-CM | POA: Insufficient documentation

## 2020-05-29 NOTE — Progress Notes (Signed)
Bariatric Nutrition Follow-Up Visit Medical Nutrition Therapy   Pt given star previously: YES  NUTRITION ASSESSMENT    Surgery date: 10/10/2019 Surgery type: sleeve Start weight at [facility]: 257 Weight today: 203   Body Composition Scale 10/25/2019 12/06/2019 02/20/2020 05/29/2020  Total Body Fat % 43.6 42 39.6 38.4  Visceral Fat 14 13 11 10   Fat-Free Mass % 56.3 57.9 60.3 61.5   Total Body Water % 42.6 43.4 44.6 45.2   Muscle-Mass lbs 33.2 33 32.7 32.6  Body Fat Displacement             Torso  lbs 66.2 60.2 51.8 48.2         Left Leg  lbs 13.2 12 10.3 9.6         Right Leg  lbs 13.2 12 10.3 9.6         Left Arm  lbs 6.6 6 5.1 4.8         Right Arm   lbs 6.6 6 5.1 4.8    Clinical  Medical hx: depression Medications:  Labs:    Lifestyle & Dietary Hx  Pt has no complaints.  Pt states she had a ovarian cyst rupture so needed IV fluids.  Pt states her goal is to be 165 pounds.  Pt states she does not deprive herself and if she wants a bite of dessert she will have one and if she is hungry she will eat and when her body says she is done she stops eating.   Estimated daily fluid intake: 64 oz Estimated daily protein intake: 80+ g Supplements: opurity and calcium  Current average weekly physical activity: personal trainer 3 day week 60 minutes; walking the other days    24-Hr Dietary Recall First Meal: coffee + fairlife + protein + 3 packs splenda Snack: 1 egg or 2 cheese squares or cheesestick Second Meal: chicken or shrimp or salmon or [place] burger + salad or zucchini + squash or black beans + cheese Snack: fairlife protein drink or cheesestick or rest of lunch or almonds Third Meal:chicken + beans or salmon or ground beef + 1 bite sweet potato Snack: sugar free popcicle or 1 sugar free peanut butter Beverages: water, coffee, gatorade zero  Post-Op Goals/ Signs/ Symptoms Using straws: no Drinking while eating: no Chewing/swallowing difficulties: no Changes in vision:  no Changes to mood/headaches: no Hair loss/changes to skin/nails: no Difficulty focusing/concentrating: no Sweating: no Dizziness/lightheadedness: no Palpitations: no  Carbonated/caffeinated beverages: no N/V/D/C/Gas: taking miralax every morning  Abdominal pain: no Dumping syndrome: no    NUTRITION DIAGNOSIS  Overweight/obesity ([place]-3.3) related to past poor dietary habits and physical inactivity as evidenced by completed bariatric surgery and following dietary guidelines for continued weight loss and healthy nutrition status.     NUTRITION INTERVENTION Nutrition counseling (C-1) and education (E-2) to facilitate bariatric surgery goals, including: . Diet advancement to the next phase (phase 4) now including fruit . The importance of consuming adequate calories as well as certain nutrients daily due to the body's need for essential vitamins, minerals, and fats . The importance of daily physical activity and to reach a goal of at least 150 minutes of moderate to vigorous physical activity weekly (or as directed by their physician) due to benefits such as increased musculature and improved lab values . The importance of intuitive eating specifically learning hunger-satiety cues and understanding the importance of learning a new body  Goals: -1 serving as a snack every day of any fruit    Handouts  Provided Include   9 month handout  Learning Style & Readiness for Change Teaching method utilized: Visual & Auditory  Demonstrated degree of understanding via: Teach Back  Barriers to learning/adherence to lifestyle change: none identified   RD's Notes for Next Visit . Assess adherence to pt chosen goals   MONITORING & EVALUATION Dietary intake, weekly physical activity, body weight  Next Steps Patient is to follow-up in March

## 2020-05-31 DIAGNOSIS — R102 Pelvic and perineal pain: Secondary | ICD-10-CM | POA: Diagnosis not present

## 2020-06-21 ENCOUNTER — Other Ambulatory Visit: Payer: Self-pay | Admitting: Obstetrics and Gynecology

## 2020-06-21 DIAGNOSIS — B372 Candidiasis of skin and nail: Secondary | ICD-10-CM | POA: Diagnosis not present

## 2020-06-21 DIAGNOSIS — Z6832 Body mass index (BMI) 32.0-32.9, adult: Secondary | ICD-10-CM | POA: Diagnosis not present

## 2020-06-21 DIAGNOSIS — Z01419 Encounter for gynecological examination (general) (routine) without abnormal findings: Secondary | ICD-10-CM | POA: Diagnosis not present

## 2020-06-21 DIAGNOSIS — Z1231 Encounter for screening mammogram for malignant neoplasm of breast: Secondary | ICD-10-CM | POA: Diagnosis not present

## 2020-06-21 DIAGNOSIS — N83209 Unspecified ovarian cyst, unspecified side: Secondary | ICD-10-CM | POA: Diagnosis not present

## 2020-07-12 ENCOUNTER — Ambulatory Visit
Admission: RE | Admit: 2020-07-12 | Discharge: 2020-07-12 | Disposition: A | Discharge status: Home / Self Care | Payer: 59 | Source: Ambulatory Visit | Attending: Radiology | Admitting: Radiology

## 2020-07-12 ENCOUNTER — Ambulatory Visit
Admission: RE | Admit: 2020-07-12 | Discharge: 2020-07-12 | Disposition: A | Discharge status: Home / Self Care | Payer: 59 | Attending: Radiology | Admitting: Radiology

## 2020-07-12 ENCOUNTER — Other Ambulatory Visit: Payer: Self-pay | Admitting: Radiology

## 2020-07-12 DIAGNOSIS — W19XXXA Unspecified fall, initial encounter: Secondary | ICD-10-CM | POA: Insufficient documentation

## 2020-07-12 DIAGNOSIS — M79641 Pain in right hand: Secondary | ICD-10-CM | POA: Diagnosis not present

## 2020-07-23 DIAGNOSIS — S66411A Strain of intrinsic muscle, fascia and tendon of right thumb at wrist and hand level, initial encounter: Secondary | ICD-10-CM | POA: Diagnosis not present

## 2020-07-30 DIAGNOSIS — M79644 Pain in right finger(s): Secondary | ICD-10-CM | POA: Diagnosis not present

## 2020-08-06 ENCOUNTER — Other Ambulatory Visit (INDEPENDENT_AMBULATORY_CARE_PROVIDER_SITE_OTHER): Payer: Self-pay | Admitting: Family Medicine

## 2020-08-06 ENCOUNTER — Encounter (INDEPENDENT_AMBULATORY_CARE_PROVIDER_SITE_OTHER): Payer: Self-pay | Admitting: Family Medicine

## 2020-08-06 DIAGNOSIS — F3289 Other specified depressive episodes: Secondary | ICD-10-CM

## 2020-08-06 MED ORDER — ESCITALOPRAM OXALATE 10 MG PO TABS: 10.0000 mg | ORAL_TABLET | Freq: Every day | ORAL | 1 refills | Status: DC

## 2020-08-06 NOTE — Telephone Encounter (Signed)
Refill requests

## 2020-08-06 NOTE — Telephone Encounter (Signed)
fyi

## 2020-08-14 ENCOUNTER — Other Ambulatory Visit: Payer: Self-pay | Admitting: Orthopaedic Surgery

## 2020-08-14 ENCOUNTER — Ambulatory Visit
Admission: RE | Admit: 2020-08-14 | Discharge: 2020-08-14 | Disposition: A | Discharge status: Home / Self Care | Payer: 59 | Source: Ambulatory Visit | Attending: Orthopaedic Surgery | Admitting: Orthopaedic Surgery

## 2020-08-14 ENCOUNTER — Other Ambulatory Visit: Payer: Self-pay

## 2020-08-14 DIAGNOSIS — M79644 Pain in right finger(s): Secondary | ICD-10-CM | POA: Insufficient documentation

## 2020-08-14 DIAGNOSIS — S53441A Ulnar collateral ligament sprain of right elbow, initial encounter: Secondary | ICD-10-CM | POA: Diagnosis not present

## 2020-08-14 DIAGNOSIS — M7989 Other specified soft tissue disorders: Secondary | ICD-10-CM | POA: Diagnosis not present

## 2020-08-16 DIAGNOSIS — S5331XD Traumatic rupture of right ulnar collateral ligament, subsequent encounter: Secondary | ICD-10-CM | POA: Diagnosis not present

## 2020-08-16 DIAGNOSIS — S66411A Strain of intrinsic muscle, fascia and tendon of right thumb at wrist and hand level, initial encounter: Secondary | ICD-10-CM | POA: Diagnosis not present

## 2020-08-20 ENCOUNTER — Other Ambulatory Visit
Admission: RE | Admit: 2020-08-20 | Discharge: 2020-08-20 | Disposition: A | Discharge status: Home / Self Care | Payer: 59 | Source: Ambulatory Visit | Attending: Orthopedic Surgery | Admitting: Orthopedic Surgery

## 2020-08-20 ENCOUNTER — Other Ambulatory Visit: Payer: Self-pay

## 2020-08-20 DIAGNOSIS — Z01812 Encounter for preprocedural laboratory examination: Secondary | ICD-10-CM | POA: Diagnosis not present

## 2020-08-20 DIAGNOSIS — Z20822 Contact with and (suspected) exposure to covid-19: Secondary | ICD-10-CM | POA: Diagnosis not present

## 2020-08-20 LAB — SARS CORONAVIRUS 2 (TAT 6-24 HRS): SARS Coronavirus 2: NEGATIVE

## 2020-08-21 ENCOUNTER — Encounter (HOSPITAL_COMMUNITY): Payer: Self-pay | Admitting: Orthopedic Surgery

## 2020-08-21 NOTE — Progress Notes (Signed)
Spoke with pt for pre-op call. Pt denies cardiac history, HTN or Diabetes.  Covid test done 08/20/20 and it's negative. Pt states she's been in quarantine since the test was done and understands thats he stays in quarantine until she comes to the hospital tomorrow.

## 2020-08-21 NOTE — H&P (Signed)
Wanda Peters is an 45 y.o. female.   Chief Complaint: RIGHT THUMB PAIN  HPI: the patient is a 45 year old female who landed on her thumb while at the gym on 07/11/20.  She was initially treated conservatively while an MRI was completed.  MRI shows tear of the ulnar collateral ligament.  She continues to have pain, swelling, and weakness of the thumb.  Discussed the reason and rationale for surgical intervention. She is here today for surgery. She denies chest pain, shortness of breath, fever, chills, nausea, vomiting, diarrhea.  Past Medical History:  Diagnosis Date  . Anemia    during childbirth  . Dry skin   . Fatigue   . Rupture of ovarian cyst   . Seasonal allergies   . Swelling of extremity   . Trouble in sleeping   . Vitamin D deficiency     Past Surgical History:  Procedure Laterality Date  . LAPAROSCOPIC GASTRIC SLEEVE RESECTION N/A 10/10/2019   Procedure: LAPAROSCOPIC GASTRIC SLEEVE RESECTION AND HIATAL HERNIA REPAIR, Upper Endo, ERAS Pathway; LAPAROSCOPIC REPAIR OF SMALL BOWEL MESENTERY;  Surgeon: Greer Pickerel, MD;  Location: [facility];  Service: General;  Laterality: N/A;    Family History  Problem Relation Age of Onset  . Cancer Mother   . Obesity Father    Social History:  reports that she has never smoked. She has never used smokeless tobacco. She reports current alcohol use. She reports that she does not use drugs.  Allergies:  Allergies  Allergen Reactions  . Sulfonamide Derivatives Rash    No medications prior to admission.    Results for orders placed or performed during the hospital encounter of 08/20/20 (from the past 48 hour(s))  SARS CORONAVIRUS 2 (TAT 6-24 HRS) Nasopharyngeal Nasopharyngeal Swab     Status: None   Collection Time: 08/20/20 11:08 AM   Specimen: Nasopharyngeal Swab  Result Value Ref Range   SARS Coronavirus 2 NEGATIVE NEGATIVE    Comment: (NOTE) SARS-CoV-2 target nucleic acids are NOT DETECTED.  The SARS-CoV-2 RNA is  generally detectable in upper and lower respiratory specimens during the acute phase of infection. Negative results do not preclude SARS-CoV-2 infection, do not rule out co-infections with other pathogens, and should not be used as the sole basis for treatment or other patient management decisions. Negative results must be combined with clinical observations, patient history, and epidemiological information. The expected result is Negative.  Fact Sheet for Patients: SugarRoll.be  Fact Sheet for Healthcare Providers: https://www.woods-mathews.com/  This test is not yet approved or cleared by the [place] FDA and  has been authorized for detection and/or diagnosis of SARS-CoV-2 by FDA under an Emergency Use Authorization (EUA). This EUA will remain  in effect (meaning this test can be used) for the duration of the COVID-19 declaration under Se ction 564(b)(1) of the Act, 21 U.S.C. section 360bbb-3(b)(1), unless the authorization is terminated or revoked sooner.  Performed at [facility], [place] [address] [place], [place] [address]    No results found.  ROS NO RECENT ILLNESSES OR HOSPITALIZATIONS  Last menstrual period 08/11/2020. Physical Exam  General Appearance:  Alert, cooperative, no distress, appears stated age  Head:  Normocephalic, without obvious abnormality, atraumatic  Eyes:  Pupils equal, conjunctiva/corneas clear,         Throat: Lips, mucosa, and tongue normal; teeth and gums normal  Neck: No visible masses     Lungs:   respirations unlabored  Chest Wall:  No tenderness or deformity  Heart:  Regular rate and rhythm,  Abdomen:   Soft, non-tender,         Extremities: RUE: SKIN INTACT, THUMB WARM WELL PERFUSED GOOD CAP REFIL GOOD DIGITAL MOTION   Pulses: 2+ and symmetric  Skin: Skin color, texture, turgor normal, no rashes or lesions     Neurologic: Normal    Assessment/Plan RIGHT THUMB ULNAR  COLLATERAL LIGAMENT TEAR    - RIGHT THUMB ULNAR COLLATERAL LIGAMENT REPAIR AND/OR RECONTSTRUCTION  WE ARE PLANNING SURGERY FOR YOUR UPPER EXTREMITY. THE RISKS AND BENEFITS OF SURGERY INCLUDE BUT NOT LIMITED TO BLEEDING INFECTION, DAMAGE TO NEARBY NERVES ARTERIES TENDONS, FAILURE OF SURGERY TO ACCOMPLISH ITS INTENDED GOALS, PERSISTENT SYMPTOMS AND NEED FOR FURTHER SURGICAL INTERVENTION. WITH THIS IN MIND WE WILL PROCEED. I HAVE DISCUSSED WITH THE PATIENT THE PRE AND POSTOPERATIVE REGIMEN AND THE DOS AND DON'TS. PT VOICED UNDERSTANDING AND INFORMED CONSENT SIGNED.  R/B/A DISCUSSED WITH PT IN OFFICE.  PT VOICED UNDERSTANDING OF PLAN CONSENT SIGNED DAY OF SURGERY PT SEEN AND EXAMINED PRIOR TO OPERATIVE PROCEDURE/DAY OF SURGERY SITE MARKED. QUESTIONS ANSWERED WILL GO HOME FOLLOWING SURGERY  Wanda Peters [facility] 08/22/20   Brynda Peon 08/21/2020, 11:48 AM

## 2020-08-22 ENCOUNTER — Ambulatory Visit (HOSPITAL_COMMUNITY): Payer: 59 | Admitting: Anesthesiology

## 2020-08-22 ENCOUNTER — Other Ambulatory Visit: Payer: Self-pay

## 2020-08-22 ENCOUNTER — Encounter (HOSPITAL_COMMUNITY)
Admission: RE | Disposition: A | Discharge status: Home / Self Care | Payer: Self-pay | Source: Home / Self Care | Attending: Orthopedic Surgery

## 2020-08-22 ENCOUNTER — Ambulatory Visit (HOSPITAL_COMMUNITY)
Admission: RE | Admit: 2020-08-22 | Discharge: 2020-08-22 | Disposition: A | Discharge status: Home / Self Care | Payer: 59 | Attending: Orthopedic Surgery | Admitting: Orthopedic Surgery

## 2020-08-22 ENCOUNTER — Encounter (HOSPITAL_COMMUNITY): Payer: Self-pay | Admitting: Orthopedic Surgery

## 2020-08-22 ENCOUNTER — Ambulatory Visit (HOSPITAL_COMMUNITY): Payer: 59

## 2020-08-22 DIAGNOSIS — S5332XA Traumatic rupture of left ulnar collateral ligament, initial encounter: Secondary | ICD-10-CM | POA: Diagnosis not present

## 2020-08-22 DIAGNOSIS — Z8349 Family history of other endocrine, nutritional and metabolic diseases: Secondary | ICD-10-CM | POA: Diagnosis not present

## 2020-08-22 DIAGNOSIS — E559 Vitamin D deficiency, unspecified: Secondary | ICD-10-CM | POA: Diagnosis not present

## 2020-08-22 DIAGNOSIS — G8918 Other acute postprocedural pain: Secondary | ICD-10-CM | POA: Diagnosis not present

## 2020-08-22 DIAGNOSIS — Y9379 Activity, other specified sports and athletics: Secondary | ICD-10-CM | POA: Diagnosis not present

## 2020-08-22 DIAGNOSIS — E7849 Other hyperlipidemia: Secondary | ICD-10-CM | POA: Diagnosis not present

## 2020-08-22 DIAGNOSIS — Z882 Allergy status to sulfonamides status: Secondary | ICD-10-CM | POA: Diagnosis not present

## 2020-08-22 DIAGNOSIS — S5331XA Traumatic rupture of right ulnar collateral ligament, initial encounter: Secondary | ICD-10-CM | POA: Diagnosis not present

## 2020-08-22 DIAGNOSIS — X509XXA Other and unspecified overexertion or strenuous movements or postures, initial encounter: Secondary | ICD-10-CM | POA: Insufficient documentation

## 2020-08-22 DIAGNOSIS — S63641A Sprain of metacarpophalangeal joint of right thumb, initial encounter: Secondary | ICD-10-CM | POA: Diagnosis not present

## 2020-08-22 HISTORY — DX: Anemia, unspecified: D64.9

## 2020-08-22 HISTORY — PX: ULNAR COLLATERAL LIGAMENT REPAIR: SHX6159

## 2020-08-22 LAB — CBC
HCT: 39 % (ref 36.0–46.0)
Hemoglobin: 12.6 g/dL (ref 12.0–15.0)
MCH: 32.6 pg (ref 26.0–34.0)
MCHC: 32.3 g/dL (ref 30.0–36.0)
MCV: 100.8 fL — ABNORMAL HIGH (ref 80.0–100.0)
Platelets: 208 10*3/uL (ref 150–400)
RBC: 3.87 MIL/uL (ref 3.87–5.11)
RDW: 11.7 % (ref 11.5–15.5)
WBC: 7.2 10*3/uL (ref 4.0–10.5)
nRBC: 0 % (ref 0.0–0.2)

## 2020-08-22 LAB — POCT PREGNANCY, URINE: Preg Test, Ur: NEGATIVE

## 2020-08-22 SURGERY — REPAIR, LIGAMENT, ULNAR COLLATERAL
Anesthesia: Monitor Anesthesia Care | Site: Thumb | Laterality: Right

## 2020-08-22 MED ORDER — FENTANYL CITRATE (PF) 100 MCG/2ML IJ SOLN
INTRAMUSCULAR | Status: AC
  Administered 2020-08-22: 50 ug via INTRAVENOUS
  Filled 2020-08-22: qty 2

## 2020-08-22 MED ORDER — LACTATED RINGERS IV SOLN: INTRAVENOUS | Status: DC

## 2020-08-22 MED ORDER — PROMETHAZINE HCL 25 MG/ML IJ SOLN: 6.2500 mg | INTRAMUSCULAR | Status: DC | PRN

## 2020-08-22 MED ORDER — CHLORHEXIDINE GLUCONATE 0.12 % MT SOLN
15.0000 mL | OROMUCOSAL | Status: AC
  Administered 2020-08-22: 15 mL via OROMUCOSAL
  Filled 2020-08-22 (×2): qty 15

## 2020-08-22 MED ORDER — FENTANYL CITRATE (PF) 100 MCG/2ML IJ SOLN: 50.0000 ug | Freq: Once | INTRAMUSCULAR | Status: AC

## 2020-08-22 MED ORDER — PROPOFOL 10 MG/ML IV BOLUS
INTRAVENOUS | Status: DC | PRN
  Administered 2020-08-22: 20 mg via INTRAVENOUS
  Administered 2020-08-22: 180 mg via INTRAVENOUS

## 2020-08-22 MED ORDER — FENTANYL CITRATE (PF) 250 MCG/5ML IJ SOLN
INTRAMUSCULAR | Status: AC
  Filled 2020-08-22: qty 5

## 2020-08-22 MED ORDER — DEXAMETHASONE SODIUM PHOSPHATE 10 MG/ML IJ SOLN
INTRAMUSCULAR | Status: DC | PRN
  Administered 2020-08-22: 10 mg via INTRAVENOUS

## 2020-08-22 MED ORDER — PROPOFOL 500 MG/50ML IV EMUL
INTRAVENOUS | Status: DC | PRN
  Administered 2020-08-22: 100 ug/kg/min via INTRAVENOUS

## 2020-08-22 MED ORDER — PROPOFOL 10 MG/ML IV BOLUS
INTRAVENOUS | Status: AC
  Filled 2020-08-22: qty 20

## 2020-08-22 MED ORDER — MIDAZOLAM HCL 2 MG/2ML IJ SOLN
INTRAMUSCULAR | Status: AC
  Administered 2020-08-22: 2 mg via INTRAVENOUS
  Filled 2020-08-22: qty 2

## 2020-08-22 MED ORDER — MIDAZOLAM HCL 2 MG/2ML IJ SOLN: 2.0000 mg | Freq: Once | INTRAMUSCULAR | Status: AC

## 2020-08-22 MED ORDER — ACETAMINOPHEN 500 MG PO TABS
1000.0000 mg | ORAL_TABLET | Freq: Once | ORAL | Status: AC
  Administered 2020-08-22: 1000 mg via ORAL
  Filled 2020-08-22: qty 2

## 2020-08-22 MED ORDER — ROPIVACAINE HCL 7.5 MG/ML IJ SOLN
INTRAMUSCULAR | Status: DC | PRN
  Administered 2020-08-22: 30 mL via PERINEURAL

## 2020-08-22 MED ORDER — ONDANSETRON HCL 4 MG/2ML IJ SOLN
INTRAMUSCULAR | Status: DC | PRN
  Administered 2020-08-22: 4 mg via INTRAVENOUS

## 2020-08-22 MED ORDER — FENTANYL CITRATE (PF) 100 MCG/2ML IJ SOLN
INTRAMUSCULAR | Status: AC
  Filled 2020-08-22: qty 2

## 2020-08-22 MED ORDER — FENTANYL CITRATE (PF) 100 MCG/2ML IJ SOLN
25.0000 ug | INTRAMUSCULAR | Status: DC | PRN
Start: 2020-08-22 — End: 2020-08-23
  Administered 2020-08-22: 50 ug via INTRAVENOUS
  Administered 2020-08-22 (×2): 25 ug via INTRAVENOUS
  Administered 2020-08-22: 50 ug via INTRAVENOUS

## 2020-08-22 MED ORDER — CEFAZOLIN SODIUM-DEXTROSE 2-4 GM/100ML-% IV SOLN
2.0000 g | INTRAVENOUS | Status: AC
  Administered 2020-08-22: 2 g via INTRAVENOUS
  Filled 2020-08-22: qty 100

## 2020-08-22 MED ORDER — LIDOCAINE 2% (20 MG/ML) 5 ML SYRINGE
INTRAMUSCULAR | Status: DC | PRN
  Administered 2020-08-22: 60 mg via INTRAVENOUS

## 2020-08-22 MED ORDER — EPHEDRINE SULFATE-NACL 50-0.9 MG/10ML-% IV SOSY
PREFILLED_SYRINGE | INTRAVENOUS | Status: DC | PRN
  Administered 2020-08-22 (×2): 5 mg via INTRAVENOUS

## 2020-08-22 MED ORDER — LIDOCAINE 2% (20 MG/ML) 5 ML SYRINGE: INTRAMUSCULAR | Status: DC | PRN

## 2020-08-22 MED ORDER — FENTANYL CITRATE (PF) 250 MCG/5ML IJ SOLN
INTRAMUSCULAR | Status: DC | PRN
  Administered 2020-08-22: 25 ug via INTRAVENOUS

## 2020-08-22 MED ORDER — 0.9 % SODIUM CHLORIDE (POUR BTL) OPTIME
TOPICAL | Status: DC | PRN
  Administered 2020-08-22: 1000 mL

## 2020-08-22 SURGICAL SUPPLY — 54 items
ANCH SUT 2-0 3/8 CRC MIC FT 4 (Anchor) ×1 IMPLANT
ANCHOR FT CORKSCREW MICRO 2-0 (Anchor) ×2 IMPLANT
BNDG CMPR 9X4 STRL LF SNTH (GAUZE/BANDAGES/DRESSINGS) ×1
BNDG CONFORM 2 STRL LF (GAUZE/BANDAGES/DRESSINGS) ×2 IMPLANT
BNDG ELASTIC 3X5.8 VLCR STR LF (GAUZE/BANDAGES/DRESSINGS) ×2 IMPLANT
BNDG ELASTIC 4X5.8 VLCR STR LF (GAUZE/BANDAGES/DRESSINGS) IMPLANT
BNDG ESMARK 4X9 LF (GAUZE/BANDAGES/DRESSINGS) ×2 IMPLANT
BNDG GAUZE ELAST 4 BULKY (GAUZE/BANDAGES/DRESSINGS) IMPLANT
CLOSURE WOUND 1/2 X4 (GAUZE/BANDAGES/DRESSINGS) ×1
CORD BIPOLAR FORCEPS 12FT (ELECTRODE) ×3 IMPLANT
COVER SURGICAL LIGHT HANDLE (MISCELLANEOUS) ×3 IMPLANT
COVER WAND RF STERILE (DRAPES) ×1 IMPLANT
CUFF TOURN SGL QUICK 18X4 (TOURNIQUET CUFF) ×3 IMPLANT
CUFF TOURN SGL QUICK 24 (TOURNIQUET CUFF)
CUFF TRNQT CYL 24X4X16.5-23 (TOURNIQUET CUFF) IMPLANT
DRAPE C-ARM MINI 42X72 WSTRAPS (DRAPES) ×2 IMPLANT
DRAPE SURG 17X23 STRL (DRAPES) ×3 IMPLANT
DRSG ADAPTIC 3X8 NADH LF (GAUZE/BANDAGES/DRESSINGS) IMPLANT
GAUZE SPONGE 4X4 12PLY STRL (GAUZE/BANDAGES/DRESSINGS) ×2 IMPLANT
GLOVE BIOGEL PI IND STRL 8.5 (GLOVE) ×1 IMPLANT
GLOVE BIOGEL PI INDICATOR 8.5 (GLOVE) ×2
GLOVE SURG ORTHO 8.0 STRL STRW (GLOVE) ×3 IMPLANT
GOWN STRL REUS W/ TWL LRG LVL3 (GOWN DISPOSABLE) ×2 IMPLANT
GOWN STRL REUS W/ TWL XL LVL3 (GOWN DISPOSABLE) ×1 IMPLANT
GOWN STRL REUS W/TWL LRG LVL3 (GOWN DISPOSABLE) ×6
GOWN STRL REUS W/TWL XL LVL3 (GOWN DISPOSABLE) ×3
KIT BASIN OR (CUSTOM PROCEDURE TRAY) ×3 IMPLANT
KIT TURNOVER KIT B (KITS) ×3 IMPLANT
NDL HYPO 25GX1X1/2 BEV (NEEDLE) IMPLANT
NEEDLE HYPO 25GX1X1/2 BEV (NEEDLE) IMPLANT
NS IRRIG 1000ML POUR BTL (IV SOLUTION) ×3 IMPLANT
PACK ORTHO EXTREMITY (CUSTOM PROCEDURE TRAY) ×3 IMPLANT
PAD ARMBOARD 7.5X6 YLW CONV (MISCELLANEOUS) ×6 IMPLANT
PAD CAST 4YDX4 CTTN HI CHSV (CAST SUPPLIES) IMPLANT
PADDING CAST COTTON 4X4 STRL (CAST SUPPLIES)
PADDING UNDERCAST 2 STRL (CAST SUPPLIES) ×2
PADDING UNDERCAST 2X4 STRL (CAST SUPPLIES) IMPLANT
SOAP 2 % CHG 4 OZ (WOUND CARE) ×3 IMPLANT
SOL PREP POV-IOD 4OZ 10% (MISCELLANEOUS) ×1 IMPLANT
SPLINT FIBERGLASS 3X12 (CAST SUPPLIES) ×2 IMPLANT
STRIP CLOSURE SKIN 1/2X4 (GAUZE/BANDAGES/DRESSINGS) ×1 IMPLANT
SUT MERSILENE 4 0 P 3 (SUTURE) IMPLANT
SUT MNCRL AB 4-0 PS2 18 (SUTURE) ×2 IMPLANT
SUT PROLENE 4 0 PS 2 18 (SUTURE) ×2 IMPLANT
SUT VIC AB 2-0 CT1 27 (SUTURE)
SUT VIC AB 2-0 CT1 TAPERPNT 27 (SUTURE) IMPLANT
SUT VIC AB 3-0 PS2 18 (SUTURE) ×2 IMPLANT
SYR CONTROL 10ML LL (SYRINGE) IMPLANT
TOWEL GREEN STERILE (TOWEL DISPOSABLE) ×3 IMPLANT
TOWEL GREEN STERILE FF (TOWEL DISPOSABLE) ×3 IMPLANT
TUBE CONNECTING 12'X1/4 (SUCTIONS)
TUBE CONNECTING 12X1/4 (SUCTIONS) IMPLANT
UNDERPAD 30X36 HEAVY ABSORB (UNDERPADS AND DIAPERS) ×3 IMPLANT
WATER STERILE IRR 1000ML POUR (IV SOLUTION) ×1 IMPLANT

## 2020-08-22 NOTE — Transfer of Care (Signed)
Immediate Anesthesia Transfer of Care Note  Patient: Wanda Peters  Procedure(s) Performed: ULNAR COLLATERAL LIGAMENT REPAIR (Right Thumb)  Patient Location: PACU  Anesthesia Type:MAC, General and Regional  Level of Consciousness: drowsy  Airway & Oxygen Therapy: Patient Spontanous Breathing  Post-op Assessment: Report given to RN and Post -op Vital signs reviewed and stable  Post vital signs: Reviewed and stable  Last Vitals:  Vitals Value Taken Time  BP 118/67 08/22/20 1814  Temp    Pulse 61 08/22/20 1818  Resp 13 08/22/20 1818  SpO2 99 % 08/22/20 1818  Vitals shown include unvalidated device data.  Last Pain:  Vitals:   08/22/20 1640  TempSrc:   PainSc: 0-No pain      Patients Stated Pain Goal: 0 (58/68/25 7493)  Complications: No complications documented.

## 2020-08-22 NOTE — Op Note (Signed)
PREOPERATIVE DIAGNOSIS: Right thumb ulnar collateral ligament tear metacarpal phalangeal joint  POSTOPERATIVE DIAGNOSIS: Same  ATTENDING SURGEON: Dr. Iran Planas who scrubbed and present for the entire procedure  ASSISTANT SURGEON: Gertie Fey, [facility] who scrubbed in necessary for repair closure and splinting in a timely fashion  ANESTHESIA: General via LMA  OPERATIVE PROCEDURE: Open repair right thumb metacarpal phalangeal joint ulnar collateral ligament complex Radiographs 3 views right thumb  IMPLANTS: Arthrex mini corkscrew anchor  EBL: Minimal  RADIOGRAPHIC INTERPRETATION: AP lateral oblique views of the thumb do show the suture anchor fixation in place with good alignment of the thumb MP joint  SURGICAL INDICATIONS:Patient is a right-hand-dominant female who sustained the injury to the right thumb almost 6 weeks ago.  Patient presented the office with the pain and instability and imaging studies that showed the tear of the ulnar collateral ligament complex.  Risk benefits and alternatives were discussed in detail with the patient and a signed informed consent was obtained.  The risks include but not limited to bleeding infection damage nearby nerves arteries or tendons loss of motion of the wrist and digits incomplete relief of symptoms and need for further surgical invention.  Signed informed consent was obtained on the day of surgery.  SURGICAL TECHNIQUE: Patient was palpated find the preoperative holding area marked the permanent marker made on the right thumb indicate correct operative site.  Patient brought back operating placed supine on anesthesia table where the regional anesthetic of been administered.  Patient also underwent induction of the general anesthetic via LMA.  A well-padded tourniquet was then placed on the right brachium and seal with the appropriate drape.  The right upper extremities then prepped and draped normal sterile fashion.  Preoperative antibiotics were  given prior to skin incision.  A timeout was called the correct site was identified the procedure then begun.  Curvilinear incision made directly over the thumb ulnar collateral ligament complex of the MP joint in a lazy S type fashion.  The tourniquet insufflated.  Dissection carried down through the skin and subcutaneous tissue.  The distal branch of the radial nerve were then carefully protected.  Following this deep dissection carried down to the adductor aponeurosis which was in continuity.  A longitudinal incision made directly in the adductor aponeurosis.  Following this this exposed the complete tear of the thumb ulnar collateral ligament without retraction.  The ligament was able to be mobilized.  After adequate preparation of the thumb proximal phalanx the Arthrex anchor was then seated nicely.  The sutures were then delivered through the collateral ligament complex and then tied down nicely in a horizontal mattress type fashion bringing the ligament down to the bone very nicely.  Capsule ligamentous closure was then done with the FiberWire suture.  The joint was had been thoroughly irrigated.  No abnormalities were noted within the joint.  Following capsular closure the adductor aponeurosis was then closed with 3-0 Vicryl.  The subcutaneous tissues closed with Monocryl and skin closed with running 4-0 Prolene.  Steri-Strips were applied.  Sterile compressive bandage applied.  The patient was placed in a well-padded thumb spica splint taken recovery in good condition.  POSTOPERATIVE PLAN: Patient be discharged to home.  See him back in the office in 10 to 14 days for wound check suture removal x-rays application of a short arm thumb spica cast.  Protected activity for the first 5 weeks placed the therapy order at the first postoperative visit and then begin outpatient therapy regimen at the  5-week mark.  Radiographs at each visit.

## 2020-08-22 NOTE — Discharge Instructions (Signed)
KEEP BANDAGE CLEAN AND DRY CALL OFFICE FOR F/U APPT [phone] IN 16 DAYS DR. Dawanda Mapel [phone] RX SENT TO [facility] [address], VICODIN/ZOFRAN KEEP HAND ELEVATED ABOVE HEART OK TO APPLY ICE TO OPERATIVE AREA CONTACT OFFICE IF ANY WORSENING PAIN OR CONCERNS.

## 2020-08-22 NOTE — Anesthesia Postprocedure Evaluation (Signed)
Anesthesia Post Note  Patient: Wanda Peters  Procedure(s) Performed: ULNAR COLLATERAL LIGAMENT REPAIR (Right Thumb)     Patient location during evaluation: PACU Anesthesia Type: Regional and General Level of consciousness: sedated Pain management: pain level controlled Vital Signs Assessment: post-procedure vital signs reviewed and stable Respiratory status: spontaneous breathing and respiratory function stable Cardiovascular status: stable Postop Assessment: no apparent nausea or vomiting Anesthetic complications: no   No complications documented.  Last Vitals:  Vitals:   08/22/20 1900 08/22/20 1915  BP: 119/74 120/80  Pulse: (!) 56 (!) 59  Resp: 11 14  Temp:  (!) 36.4 C  SpO2: 100% 100%    Last Pain:  Vitals:   08/22/20 1915  TempSrc:   PainSc: 5                  Wiliam Cauthorn DANIEL

## 2020-08-22 NOTE — Progress Notes (Signed)
Orthopedic Tech Progress Note Patient Details:  Wanda Peters March 18, 1976 [id number]  Ortho Devices Type of Ortho Device: Arm sling Ortho Device/Splint Location: Right Arm Ortho Device/Splint Interventions: Application   Post Interventions Patient Tolerated: Well   [name] 08/22/2020, 7:46 PM

## 2020-08-22 NOTE — Anesthesia Procedure Notes (Signed)
Procedure Name: LMA Insertion Date/Time: 08/22/2020 5:26 PM Performed by: Clearnce Sorrel, CRNA Pre-anesthesia Checklist: Patient identified, Emergency Drugs available, Suction available, Patient being monitored and Timeout performed Patient Re-evaluated:Patient Re-evaluated prior to induction Oxygen Delivery Method: Circle system utilized Preoxygenation: Pre-oxygenation with 100% oxygen Induction Type: IV induction LMA: LMA inserted LMA Size: 4.0 Number of attempts: 1 Placement Confirmation: CO2 detector and breath sounds checked- equal and bilateral Tube secured with: Tape Dental Injury: Teeth and Oropharynx as per pre-operative assessment

## 2020-08-22 NOTE — Anesthesia Preprocedure Evaluation (Addendum)
Anesthesia Evaluation  Patient identified by MRN, date of birth, ID band Patient awake    Reviewed: Allergy & Precautions, NPO status , Patient's Chart, lab work & pertinent test results  Airway Mallampati: II  TM Distance: >3 FB Neck ROM: Full    Dental  (+) Teeth Intact, Dental Advisory Given   Pulmonary neg pulmonary ROS,    Pulmonary exam normal breath sounds clear to auscultation       Cardiovascular negative cardio ROS Normal cardiovascular exam Rhythm:Regular Rate:Normal     Neuro/Psych PSYCHIATRIC DISORDERS Depression negative neurological ROS     GI/Hepatic Neg liver ROS, GERD  Medicated,  Endo/Other  Obesity   Renal/GU negative Renal ROS     Musculoskeletal Right thumb ulnar collateral ligament tear   Abdominal   Peds  Hematology negative hematology ROS (+)   Anesthesia Other Findings Day of surgery medications reviewed with the patient.  Reproductive/Obstetrics                            Anesthesia Physical Anesthesia Plan  ASA: II  Anesthesia Plan: Regional and MAC   Post-op Pain Management:    Induction: Intravenous  PONV Risk Score and Plan: 2 and Propofol infusion, Midazolam and Ondansetron  Airway Management Planned: Nasal Cannula and Natural Airway  Additional Equipment:   Intra-op Plan:   Post-operative Plan:   Informed Consent: I have reviewed the patients History and Physical, chart, labs and discussed the procedure including the risks, benefits and alternatives for the proposed anesthesia with the patient or authorized representative who has indicated his/her understanding and acceptance.     Dental advisory given  Plan Discussed with: CRNA  Anesthesia Plan Comments:         Anesthesia Quick Evaluation

## 2020-08-22 NOTE — Anesthesia Procedure Notes (Signed)
Anesthesia Regional Block: Axillary brachial plexus block   Pre-Anesthetic Checklist: ,, timeout performed, Correct Patient, Correct Site, Correct Laterality, Correct Procedure, Correct Position, site marked, Risks and benefits discussed,  Surgical consent,  Pre-op evaluation,  At surgeon's request and post-op pain management  Laterality: Right  Prep: chloraprep       Needles:  Injection technique: Single-shot  Needle Type: Echogenic Needle     Needle Length: 9cm  Needle Gauge: 21     Additional Needles:   Procedures:,,,, ultrasound used (permanent image in chart),,,,  Narrative:  Start time: 08/22/2020 4:20 PM End time: 08/22/2020 4:30 PM Injection made incrementally with aspirations every 5 mL.  Performed by: Personally  Anesthesiologist: Catalina Gravel, MD  Additional Notes: No pain on injection. No increased resistance to injection. Injection made in 5cc increments.  Good needle visualization.  Patient tolerated procedure well.

## 2020-08-23 ENCOUNTER — Ambulatory Visit: Payer: 59

## 2020-08-23 ENCOUNTER — Encounter (HOSPITAL_COMMUNITY): Payer: Self-pay | Admitting: Orthopedic Surgery

## 2020-08-24 ENCOUNTER — Encounter (HOSPITAL_COMMUNITY): Payer: Self-pay | Admitting: Orthopedic Surgery

## 2020-08-28 ENCOUNTER — Ambulatory Visit: Payer: 59 | Admitting: Skilled Nursing Facility1

## 2020-09-03 ENCOUNTER — Other Ambulatory Visit: Payer: Self-pay

## 2020-09-03 ENCOUNTER — Encounter: Payer: 59 | Attending: General Surgery | Admitting: Skilled Nursing Facility1

## 2020-09-03 DIAGNOSIS — E669 Obesity, unspecified: Secondary | ICD-10-CM | POA: Insufficient documentation

## 2020-09-03 DIAGNOSIS — Z6833 Body mass index (BMI) 33.0-33.9, adult: Secondary | ICD-10-CM | POA: Diagnosis not present

## 2020-09-03 NOTE — Progress Notes (Signed)
Bariatric Nutrition Follow-Up Visit Medical Nutrition Therapy   Pt given star previously: YES  NUTRITION ASSESSMENT    Surgery date: 10/10/2019 Surgery type: sleeve Start weight at [facility]: 257 Weight today: 200.6   Body Composition Scale 10/25/2019 12/06/2019 02/20/2020 05/29/2020 09/03/2020  Total Body Fat % 43.6 42 39.6 38.4 38.1  Visceral Fat 14 13 11 10 10   Fat-Free Mass % 56.3 57.9 60.3 61.5 61.8   Total Body Water % 42.6 43.4 44.6 45.2 45.4   Muscle-Mass lbs 33.2 33 32.7 32.6 32.6  Body Fat Displacement              Torso  lbs 66.2 60.2 51.8 48.2 47.3         Left Leg  lbs 13.2 12 10.3 9.6 9.4         Right Leg  lbs 13.2 12 10.3 9.6 9.4         Left Arm  lbs 6.6 6 5.1 4.8 4.7         Right Arm   lbs 6.6 6 5.1 4.8 4.7    Clinical  Medical hx: depression Medications:  Labs:    Lifestyle & Dietary Hx  Pt broke her thumb and was out of the gym for a week initially after break but has continued going since then. Pt states she has more weight that she would like to lose, but her clothes are feeling looser. Pt states that she is feeling great and is no longer weighing herself daily typically not even weekly any longer. Pt states her goal weight is still 165# long-term, but isn't sure how to make any other changes to get here. Pt states she was putting fairlife milk in latte and switched to unsweetened almond milk to further decrease calories.   Estimated daily fluid intake: 108 oz Estimated daily protein intake: 80+ g Supplements: bariatric fusion MVI and calcium   Current average weekly physical activity: personal trainer 3 day week 60 minutes; walking the other days    24-Hr Dietary Recall First Meal: 20 g protein powder in coffee + almond milk Snack: 1 egg or 2 cheese squares or cheesestick Second Meal: [place] on low carb, high fiber wrap + sharp cheddar cheese + small handful of veggie straws (protein and vegetable) Snack: banana + fairlife protein shake Third Meal: 4  oz protein + a few spoonfuls sweet potato + vegetable  Snack: 2 Lily peanut butter chocolate cups Beverages: water, coffee, gatorade zero, almond milk (vanilla, unsweetened)   Post-Op Goals/ Signs/ Symptoms Using straws: no Drinking while eating: no Chewing/swallowing difficulties: no Changes in vision: no Changes to mood/headaches: no Hair loss/changes to skin/nails: no Difficulty focusing/concentrating: no Sweating: no Dizziness/lightheadedness: no Palpitations: no  Carbonated/caffeinated beverages: no N/V/D/C/Gas: taking miralax every morning  Abdominal pain: no Dumping syndrome: no    NUTRITION DIAGNOSIS  Overweight/obesity ([place]-3.3) related to past poor dietary habits and physical inactivity as evidenced by completed bariatric surgery and following dietary guidelines for continued weight loss and healthy nutrition status.     NUTRITION INTERVENTION Nutrition counseling (C-1) and education (E-2) to facilitate bariatric surgery goals, including: . Diet advancement to the next phase (phase 4) now including fruit . The importance of consuming adequate calories as well as certain nutrients daily due to the body's need for essential vitamins, minerals, and fats . The importance of daily physical activity and to reach a goal of at least 150 minutes of moderate to vigorous physical activity weekly (or as directed by their physician)  due to benefits such as increased musculature and improved lab values . The importance of intuitive eating specifically learning hunger-satiety cues and understanding the importance of learning a new body  Goals: -1 serving as a snack every day of any fruit  - slightly increase complex carbohydrate and decrease protein  Handouts Provided Include   9 month handout  Learning Style & Readiness for Change Teaching method utilized: Visual & Auditory  Demonstrated degree of understanding via: Teach Back  Barriers to learning/adherence to lifestyle change:  none identified   RD's Notes for Next Visit . Assess adherence to pt chosen goals   MONITORING & EVALUATION Dietary intake, weekly physical activity, body weight  Next Steps Patient is to follow-up in June

## 2020-09-04 DIAGNOSIS — Z4789 Encounter for other orthopedic aftercare: Secondary | ICD-10-CM | POA: Diagnosis not present

## 2020-09-04 DIAGNOSIS — S63641D Sprain of metacarpophalangeal joint of right thumb, subsequent encounter: Secondary | ICD-10-CM | POA: Diagnosis not present

## 2020-09-11 ENCOUNTER — Other Ambulatory Visit: Payer: Self-pay

## 2020-09-28 DIAGNOSIS — M25641 Stiffness of right hand, not elsewhere classified: Secondary | ICD-10-CM | POA: Diagnosis not present

## 2020-09-28 DIAGNOSIS — S63641D Sprain of metacarpophalangeal joint of right thumb, subsequent encounter: Secondary | ICD-10-CM | POA: Diagnosis not present

## 2020-10-02 ENCOUNTER — Ambulatory Visit (INDEPENDENT_AMBULATORY_CARE_PROVIDER_SITE_OTHER): Payer: 59 | Admitting: Family Medicine

## 2020-10-04 DIAGNOSIS — M25641 Stiffness of right hand, not elsewhere classified: Secondary | ICD-10-CM | POA: Diagnosis not present

## 2020-10-10 DIAGNOSIS — M25641 Stiffness of right hand, not elsewhere classified: Secondary | ICD-10-CM | POA: Diagnosis not present

## 2020-10-28 ENCOUNTER — Telehealth: Payer: 59 | Admitting: Family

## 2020-10-28 DIAGNOSIS — R6889 Other general symptoms and signs: Secondary | ICD-10-CM

## 2020-10-28 MED ORDER — PREDNISONE 10 MG (21) PO TBPK: ORAL_TABLET | ORAL | 0 refills | Status: DC

## 2020-10-28 NOTE — Progress Notes (Signed)
E visit for Flu like symptoms   We are sorry that you are not feeling well.  Here is how we plan to help! Based on what you have shared with me it looks like you may have a respiratory virus that may be influenza.  Influenza or "the flu" is   an infection caused by a respiratory virus. The flu virus is highly contagious and persons who did not receive their yearly flu vaccination may "catch" the flu from close contact.  We have anti-viral medications to treat the viruses that cause this infection. They are not a "cure" and only shorten the course of the infection. These prescriptions are most effective when they are given within the first 2 days of "flu" symptoms. Antiviral medication are indicated if you have a high risk of complications from the flu. You should  also consider an antiviral medication if you are in close contact with someone who is at risk. These medications can help patients avoid complications from the flu  but have side effects that you should know. Possible side effects from Tamiflu or oseltamivir include nausea, vomiting, diarrhea, dizziness, headaches, eye redness, sleep problems or other respiratory symptoms. You should not take Tamiflu if you have an allergy to oseltamivir or any to the ingredients in Tamiflu.  Based upon your symptoms and potential risk factors I recommend that you follow the flu symptoms recommendation that I have listed below.  ANYONE WHO HAS FLU SYMPTOMS SHOULD: . Stay home. The flu is highly contagious and going out or to work exposes others! . Be sure to drink plenty of fluids. Water is fine as well as fruit juices, sodas and electrolyte beverages. You may want to stay away from caffeine or alcohol. If you are nauseated, try taking small sips of liquids. How do you know if you are getting enough fluid? Your urine should be a pale yellow or almost colorless. . Get rest. . Taking a steamy shower or using a humidifier may help nasal congestion and ease  sore throat pain. Using a saline nasal spray works much the same way. . Cough drops, hard candies and sore throat lozenges may ease your cough. . Line up a caregiver. Have someone check on you regularly.   GET HELP RIGHT AWAY IF: . You cannot keep down liquids or your medications. . You become short of breath . Your fell like you are going to pass out or loose consciousness. . Your symptoms persist after you have completed your treatment plan MAKE SURE YOU   Understand these instructions.  Will watch your condition.  Will get help right away if you are not doing well or get worse.  Your e-visit answers were reviewed by a board certified advanced clinical practitioner to complete your personal care plan.  Depending on the condition, your plan could have included both over the counter or prescription medications.  If there is a problem please reply  once you have received a response from your provider.  Your safety is important to us.  If you have drug allergies check your prescription carefully.    You can use MyChart to ask questions about today's visit, request a non-urgent call back, or ask for a work or school excuse for 24 hours related to this e-Visit. If it has been greater than 24 hours you will need to follow up with your provider, or enter a new e-Visit to address those concerns.  You will get an e-mail in the next two days asking about   your experience.  I hope that your e-visit has been valuable and will speed your recovery. Thank you for using e-visits.  Approximately 5 minutes was spent documenting and reviewing patient's chart.

## 2020-10-28 NOTE — Addendum Note (Signed)
Addended by: Evelina Dun A on: 10/28/2020 09:48 AM   Modules accepted: Orders

## 2020-11-02 DIAGNOSIS — S63641D Sprain of metacarpophalangeal joint of right thumb, subsequent encounter: Secondary | ICD-10-CM | POA: Diagnosis not present

## 2020-11-02 DIAGNOSIS — M25641 Stiffness of right hand, not elsewhere classified: Secondary | ICD-10-CM | POA: Diagnosis not present

## 2020-11-13 ENCOUNTER — Other Ambulatory Visit: Payer: Self-pay

## 2020-11-13 ENCOUNTER — Encounter: Payer: 59 | Attending: General Surgery | Admitting: Skilled Nursing Facility1

## 2020-11-13 DIAGNOSIS — E669 Obesity, unspecified: Secondary | ICD-10-CM | POA: Diagnosis not present

## 2020-11-13 DIAGNOSIS — Z6833 Body mass index (BMI) 33.0-33.9, adult: Secondary | ICD-10-CM | POA: Diagnosis not present

## 2020-11-13 NOTE — Progress Notes (Signed)
Bariatric Nutrition Follow-Up Visit Medical Nutrition Therapy   NUTRITION ASSESSMENT    Surgery date: 10/10/2019 Surgery type: sleeve Start weight at [facility]: 257 Weight today: 197.8   Body Composition Scale 10/25/2019 12/06/2019 02/20/2020 05/29/2020 09/03/2020 11/13/2020  Total Body Fat % 43.6 42 39.6 38.4 38.1 38.2  Visceral Fat 14 13 11 10 10 10   Fat-Free Mass % 56.3 57.9 60.3 61.5 61.8 61.7   Total Body Water % 42.6 43.4 44.6 45.2 45.4 45.3   Muscle-Mass lbs 33.2 33 32.7 32.6 32.6 32  Body Fat Displacement               Torso  lbs 66.2 60.2 51.8 48.2 47.3 46.8         Left Leg  lbs 13.2 12 10.3 9.6 9.4 9.3         Right Leg  lbs 13.2 12 10.3 9.6 9.4 9.3         Left Arm  lbs 6.6 6 5.1 4.8 4.7 4.6         Right Arm   lbs 6.6 6 5.1 4.8 4.7 4.6    Clinical  Medical hx: depression Medications:  Labs:    Lifestyle & Dietary Hx   Pt state she was able to break 200 pounds by increasing calories through complex carbohydrates. Pt states she typically carries around her lunch bag of food and snacks. Pt states she has no complaints sleeps well no issues with bowel and taking her supplements. Pt state she cut her mirilax down to to 1/2 serving working on starting it every other day rather than daily.   Estimated daily fluid intake: 108 oz Estimated daily protein intake: 80+ g Supplements: bariatric fusion MVI and calcium   Current average weekly physical activity: personal trainer 3 day week 60 minutes; walking the other days    24-Hr Dietary Recall First Meal: 20 g protein unflavored powder in coffee + failofe milk Snack: 1 cheese stick or 1 yogurt + granola Second Meal: [place] deli meat on low carb wrap + carrots + cucumbers  Snack: 1 banana Snack: almonds or fairlife protein drink Third Meal: 4 oz protein + a few spoonfuls sweet potato + vegetable  Snack: 2 Lily peanut butter chocolate cups or ice cream or nothing or 1 banana Beverages: water, coffee, gatorade zero, almond  milk (vanilla, unsweetened)   Post-Op Goals/ Signs/ Symptoms Using straws: no Drinking while eating: no Chewing/swallowing difficulties: no Changes in vision: no Changes to mood/headaches: no Hair loss/changes to skin/nails: no Difficulty focusing/concentrating: no Sweating: no Dizziness/lightheadedness: no Palpitations: no  Carbonated/caffeinated beverages: no N/V/D/C/Gas: taking miralax every morning  Abdominal pain: no Dumping syndrome: no    NUTRITION DIAGNOSIS  Overweight/obesity ([place]-3.3) related to past poor dietary habits and physical inactivity as evidenced by completed bariatric surgery and following dietary guidelines for continued weight loss and healthy nutrition status.     NUTRITION INTERVENTION  Nutrition counseling (C-1) and education (E-2) to facilitate bariatric surgery goals, including: . The importance of consuming adequate calories as well as certain nutrients daily due to the body's need for essential vitamins, minerals, and fats . The importance of daily physical activity and to reach a goal of at least 150 minutes of moderate to vigorous physical activity weekly (or as directed by their physician) due to benefits such as increased musculature and improved lab values . The importance of intuitive eating specifically learning hunger-satiety cues and understanding the importance of learning a new body . Why you need complex  carbohydrates: Whole grains and other complex carbohydrates are required to have a healthy diet. Whole grains provide fiber which can help with blood glucose levels and help keep you satiated. Fruits and starchy vegetables provide essential vitamins and minerals required for immune function, eyesight support, brain support, bone density, wound healing and many other functions within the body. According to the current evidenced based 2020-2025 Dietary Guidelines for Americans, complex carbohydrates are part of a healthy eating pattern which is  associated with a decreased risk for type 2 diabetes, cancers, and cardiovascular disease.  Encouraged patient to honor their body's internal hunger and fullness cues.  Throughout the day, check in mentally and rate hunger. Stop eating when satisfied not full regardless of how much food is left on the plate.  Get more if still hungry 20-30 minutes later.  The key is to honor satisfaction so throughout the meal, rate fullness factor and stop when comfortably satisfied not physically full. The key is to honor hunger and fullness without any feelings of guilt or shame.  Pay attention to what the internal cues are, rather than any external factors. This will enhance the confidence you have in listening to your own body and following those internal cues enabling you to increase how often you eat when you are hungry not out of appetite and stop when you are satisfied not full.  Encouraged pt to continue to eat balanced meals inclusive of non starchy vegetables 2 times a day 7 days a week Encouraged pt to choose lean protein sources: limiting beef, pork, sausage, hotdogs, and lunch meat Encourage pt to choose healthy fats such as plant based limiting animal fats . Encouraged pt to continue to drink a minium 64 fluid ounces with half being plain water to satisfy proper hydration   Goals: -great job!  Handouts Provided Include   12 month handouts  Learning Style & Readiness for Change Teaching method utilized: Visual & Auditory  Demonstrated degree of understanding via: Teach Back  Barriers to learning/adherence to lifestyle change: none identified   RD's Notes for Next Visit . Assess adherence to pt chosen goals   MONITORING & EVALUATION Dietary intake, weekly physical activity, body weight  Next Steps Patient is to follow-up as needed

## 2020-11-21 ENCOUNTER — Other Ambulatory Visit: Payer: Self-pay

## 2020-11-21 MED FILL — Escitalopram Oxalate Tab 10 MG (Base Equiv): ORAL | 90 days supply | Qty: 90 | Fill #0 | Status: CN

## 2020-11-21 MED FILL — Norethin-Eth Estradiol-Fe Tab 1 MG-10 MCG (24)/10 MCG (2): ORAL | 84 days supply | Qty: 84 | Fill #0 | Status: CN

## 2020-12-11 ENCOUNTER — Other Ambulatory Visit: Payer: Self-pay

## 2021-01-17 ENCOUNTER — Telehealth: Payer: 59 | Admitting: Physician Assistant

## 2021-01-17 DIAGNOSIS — L309 Dermatitis, unspecified: Secondary | ICD-10-CM | POA: Diagnosis not present

## 2021-01-17 MED ORDER — LIDOCAINE HCL 4 % EX SOLN
4.0000 mL | Freq: Every day | CUTANEOUS | 0 refills | Status: DC
  Filled 2021-01-17: qty 20, 5d supply, fill #0
  Filled 2021-01-18: qty 50, 12d supply, fill #0

## 2021-01-17 MED ORDER — CLOBETASOL PROPIONATE 0.05 % EX SOLN
1.0000 "application " | Freq: Two times a day (BID) | CUTANEOUS | 0 refills | Status: DC
  Filled 2021-01-17: qty 50, 25d supply, fill #0

## 2021-01-17 NOTE — Progress Notes (Signed)
E-Visit for Eczema  We are sorry that you are not feeling well. Here is how we plan to help! Based on what you shared with me it looks like you have eczema (atopic dermatitis).  Although the cause of eczema is not completely understood, genetics appear to play a strong role, and people with a family history of eczema are at increased risk of developing the condition. In most people with eczema, there is a genetic abnormality in the outermost layer of the skin, called the epidermis   Most people with eczema develop their first symptoms as children, before the age of 59. Intense itching of the skin, patches of redness, small bumps, and skin flaking are common. Scratching can further inflame the skin and worsen the itching. The itchiness may be more noticeable at nighttime.  Eczema commonly affects the back of the neck, the elbow creases, and the backs of the knees. Other affected areas may include the face, wrists, and forearms. The skin may become thickened and darkened, or even scarred, from repeated scratching. Eliminating factors that aggravate your eczema symptoms can help to control the symptoms. Possible triggers may include: ? Cold or dry environments ? Sweating ? Emotional stress or anxiety ? Rapid temperature changes ? Exposure to certain chemicals or cleaning solutions, including soaps and detergents, perfumes and cosmetics, wool or synthetic fibers, dust, sand, and cigarette smoke Keeping your skin hydrated Emollients -- Emollients are creams and ointments that moisturize the skin and prevent it from drying out. The best emollients for people with eczema are thick creams (such as Eucerin, Cetaphil, and Nutraderm) or ointments (such as petroleum jelly, Aquaphor, and Vaseline), which contain little to no water. Emollients are most effective when applied immediately after bathing. Emollients can be applied twice a day or more often if needed. Lotions contain more water than creams and  ointments and are less effective for moisturizing the skin. Bathing -- It is not clear if showers or baths are better for keeping the skin hydrated. Lukewarm baths or showers can hydrate and cool the skin, temporarily relieving itching from eczema. An unscented, mild soap or non-soap cleanser (such as Cetaphil) should be used sparingly. Apply an emollient immediately after bathing or showering to prevent your skin from drying out as a result of water evaporation. Emollient bath additives (products you add to the bath water) have not been found to help relieve symptoms. Hot or long baths (more than 10 to 15 minutes) and showers should be avoided since they can dry out the skin.  Based on what you shared with me you may have eczema.   I have prescribed: Clobetasol scalp solution Apply topically twice daily until flare calms down, then as needed Lidocaine Solution Apply topically daily x 5 days as needed for discomfort from eczema  I recommend dilute bleach baths for people with eczema. These baths help to decrease the number of bacteria on the skin that can cause infections or worsen symptoms. To prepare a bleach bath, one-fourth to one-half cup of bleach is placed in a full bathtub (about 40 gallons) of water. Bleach baths are usually taken for 5 to 10 minutes twice per week and should be followed by application of an emollient (listed above). I recommend you take Benadryl '25mg'$  - '50mg'$  every 4 hours to control the symptoms (including itching) but if they last over 24 hours it is best that you see an office based provider for follow up.  HOME CARE: Take lukewarm showers or baths Apply creams  and ointments to prevent the skin from drying (Eucerin, Cetaphil, Nutraderm, petroleum jelly, Aquaphor or Vaseline) - these products contain less water than other lotions and are more effective for moisturizing the skin Limit exposure to cold or dry environments, sweating, emotional stress and anxiety, rapid  temperature changes and exposure to chemicals and cleaning products, soaps and detergents, perfumes, cosmetics, wool and synthetic fibers, dust, sand and cigarette- factors which can aggravate eczema symptoms.  Use a hydrocortisone cream once or twice a day Take an antihistamine like Benadryl for widespread rashes that itch.  The adult dosage of Benadryl is 25-50 mg by mouth 4 times daily. Caution: This type of medication may cause sleepiness.  Do not drink alcohol, drive, or operate dangerous machinery while taking antihistamines.  Do not take these medications if you have prostate enlargement.  Read the package instructions thoroughly on all medications that you take.  GET HELP RIGHT AWAY IF: Symptoms that don't go away after treatment. Severe itching that persists. You develop a fever. Your skin begins to drain. You have a sore throat. You become short of breath.  MAKE SURE YOU   Understand these instructions. Will watch your condition. Will get help right away if you are not doing well or get worse.    Thank you for choosing an e-visit.  Your e-visit answers were reviewed by a board certified advanced clinical practitioner to complete your personal care plan. Depending upon the condition, your plan could have included both over the counter or prescription medications.  Please review your pharmacy choice. Make sure the pharmacy is open so you can pick up prescription now. If there is a problem, you may contact your provider through [employer] and have the prescription routed to another pharmacy.  Your safety is important to [name]. If you have drug allergies check your prescription carefully.   For the next 24 hours you can use MyChart to ask questions about today's visit, request a non-urgent call back, or ask for a work or school excuse. You will get an email in the next two days asking about your experience. I hope that your e-visit has been valuable and will speed your  recovery.  I provided 8 minutes of non face-to-face time during this encounter for chart review and documentation.

## 2021-01-18 ENCOUNTER — Other Ambulatory Visit: Payer: Self-pay

## 2021-01-18 MED FILL — Norethin-Eth Estradiol-Fe Tab 1 MG-10 MCG (24)/10 MCG (2): ORAL | 56 days supply | Qty: 56 | Fill #0 | Status: AC

## 2021-01-18 MED FILL — Escitalopram Oxalate Tab 10 MG (Base Equiv): ORAL | 90 days supply | Qty: 90 | Fill #0 | Status: AC

## 2021-01-21 ENCOUNTER — Other Ambulatory Visit: Payer: Self-pay

## 2021-02-01 ENCOUNTER — Other Ambulatory Visit: Payer: Self-pay

## 2021-03-25 ENCOUNTER — Telehealth: Payer: 59 | Admitting: Physician Assistant

## 2021-03-25 DIAGNOSIS — J019 Acute sinusitis, unspecified: Secondary | ICD-10-CM

## 2021-03-25 DIAGNOSIS — B9689 Other specified bacterial agents as the cause of diseases classified elsewhere: Secondary | ICD-10-CM

## 2021-03-25 MED ORDER — AMOXICILLIN-POT CLAVULANATE 875-125 MG PO TABS
1.0000 | ORAL_TABLET | Freq: Two times a day (BID) | ORAL | 0 refills | Status: DC
  Filled 2021-03-25: qty 14, 7d supply, fill #0

## 2021-03-25 NOTE — Progress Notes (Signed)

## 2021-03-26 ENCOUNTER — Other Ambulatory Visit (HOSPITAL_COMMUNITY): Payer: Self-pay

## 2021-03-28 ENCOUNTER — Other Ambulatory Visit (HOSPITAL_BASED_OUTPATIENT_CLINIC_OR_DEPARTMENT_OTHER): Payer: Self-pay

## 2021-03-28 MED ORDER — INFLUENZA VAC SPLIT QUAD 0.5 ML IM SUSY
PREFILLED_SYRINGE | INTRAMUSCULAR | 0 refills | Status: DC
  Filled 2021-03-28: qty 0.5, 1d supply, fill #0

## 2021-04-09 ENCOUNTER — Other Ambulatory Visit: Payer: Self-pay

## 2021-04-09 ENCOUNTER — Encounter (INDEPENDENT_AMBULATORY_CARE_PROVIDER_SITE_OTHER): Payer: Self-pay | Admitting: Family Medicine

## 2021-04-09 ENCOUNTER — Ambulatory Visit (INDEPENDENT_AMBULATORY_CARE_PROVIDER_SITE_OTHER): Payer: 59 | Admitting: Family Medicine

## 2021-04-09 DIAGNOSIS — Z9884 Bariatric surgery status: Secondary | ICD-10-CM

## 2021-04-09 DIAGNOSIS — Z6841 Body Mass Index (BMI) 40.0 and over, adult: Secondary | ICD-10-CM | POA: Diagnosis not present

## 2021-04-09 DIAGNOSIS — E88819 Insulin resistance, unspecified: Secondary | ICD-10-CM

## 2021-04-09 DIAGNOSIS — E7800 Pure hypercholesterolemia, unspecified: Secondary | ICD-10-CM

## 2021-04-09 DIAGNOSIS — F3289 Other specified depressive episodes: Secondary | ICD-10-CM | POA: Diagnosis not present

## 2021-04-09 DIAGNOSIS — E8881 Metabolic syndrome: Secondary | ICD-10-CM | POA: Diagnosis not present

## 2021-04-09 DIAGNOSIS — E559 Vitamin D deficiency, unspecified: Secondary | ICD-10-CM | POA: Diagnosis not present

## 2021-04-09 DIAGNOSIS — K909 Intestinal malabsorption, unspecified: Secondary | ICD-10-CM | POA: Diagnosis not present

## 2021-04-09 MED ORDER — ESCITALOPRAM OXALATE 10 MG PO TABS
ORAL_TABLET | Freq: Every day | ORAL | 1 refills | Status: DC
Start: 2021-04-09 — End: 2021-07-02
  Filled 2021-04-09: qty 90, 90d supply, fill #0

## 2021-04-09 NOTE — Progress Notes (Addendum)
Chief Complaint:   OBESITY Wanda Peters is here to discuss her progress with her obesity treatment plan along with follow-up of her obesity related diagnoses. Wanda Peters is on practicing portion control and making smarter food choices, such as increasing vegetables and decreasing simple carbohydrates and states she is following her eating plan approximately 80% of the time. Wanda Peters states she is doing H.I.I.T. for 60 minutes 3 times per week.  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 Her weight has been plateaued for 3 months. Her goal weight is 175 lbs. She feels she is eating more carbs than she should at times and tends to "graze". She has some cravings (salt and sweet). She says that she meal preps and eats 3 good meals per day. e. Her protein intake is good at 90-100 grams daily.  She is very consistent with exercise. She is not interested in medication for appetite.   Subjective:   1. Status post laparoscopic sleeve gastrectomy/Intestinal malabsorption, unspecified type Wanda Peters take 1 bariatric multivitamin daily, tums 3 times daily and Vitamin D 2000 daily.  2. Elevated LDL cholesterol level Wanda Peters's last LDL was slightly elevated at 103. Her Triglycerides and HDL were within normal limits. She is not on a statin.   Lab Results  Component Value Date   CHOL 177 04/03/2020   HDL 59 04/03/2020   LDLCALC 103 (H) 04/03/2020   TRIG 80 04/03/2020   CHOLHDL 3.4 09/30/2018   Lab Results  Component Value Date   ALT 12 04/12/2020   AST 15 04/12/2020   ALKPHOS 49 04/12/2020   BILITOT 0.7 04/12/2020   The 10-year ASCVD risk score (Arnett DK, et al., 2019) is: 0.4%   Values used to calculate the score:     Age: 45 years     Sex: Female      Is Non-Hispanic African American: No     Diabetic: No     Tobacco smoker: No     Systolic Blood Pressure: 96 mmHg     Is BP treated: No     HDL Cholesterol: 59 mg/dL     Total Cholesterol: 177 mg/dL   3. Insulin resistance Wanda Peters is not on Metformin. Her last A1C was 5.2.  Lab Results  Component Value Date   INSULIN 4.1 04/03/2020   INSULIN 7.6 05/23/2019   INSULIN 7.9 04/19/2018   INSULIN 8.6 12/21/2017   INSULIN 10.3 08/26/2017   Lab Results  Component Value Date   HGBA1C 5.2 04/03/2020    4. Other depression, with emotional eating  Wanda Peters notes some grazing and cravings.  She says that she is eating too many carbs. She has been on bupropion in the past but did not feel it was helpful for control of cravings.  She is on Lexapro 10 mg daily for sleep but noted it may also be beneficial for her mood.  Assessment/Plan:   1. Status post laparoscopic sleeve gastrectomy/Intestinal malabsorption, unspecified type We will check labs today.   - Vitamin B12 - CBC with Differential/Platelet - VITAMIN D 25 Hydroxy (Vit-D Deficiency, Fractures) - Vitamin B1  2. Elevated LDL cholesterol level We will check FLP today.   - Lipid Panel With LDL/HDL Ratio  3. Insulin resistance Wanda Peters will continue to work on weight loss, exercise, and  decreasing simple carbohydrates to help decrease the risk of diabetes. We will check labs today. Wanda Peters agreed to follow-up with [name] as directed to closely monitor her progress.  - Comprehensive metabolic panel - Hemoglobin A1c - Insulin, random  4. Other depression, with emotional eating   We will refill Lexapro 10 mg daily for 3 months with no refills.  - escitalopram (LEXAPRO) 10 MG tablet; TAKE 1 TABLET (10 MG TOTAL) BY MOUTH DAILY.  Dispense: 90 tablet; Refill: 1  5. Obesity: current BMI 31.17 Wanda Peters is currently in the action stage of change. As such, her goal is to continue with weight loss efforts. She has agreed to keeping  a food journal and adhering to recommended goals of 1400-1500 calories and 90 grams of protein daily.   Exercise goals:  As is.  Behavioral modification strategies: decreasing simple carbohydrates and keeping a strict food journal.  Wanda Peters has agreed to follow-up with our clinic in 12 weeks per patient request.  Objective:   Blood pressure 96/64, pulse (!) 59, temperature 97.9 F (36.6 C), height 5\' 6"  (1.676 m), weight 193 lb (87.5 kg), SpO2 100 %. Body mass index is 31.15 kg/m.  General: Cooperative, alert, well developed, in no acute distress. HEENT: Conjunctivae and lids unremarkable. Cardiovascular: Regular rhythm.  Lungs: Normal work of breathing. Neurologic: No focal deficits.   Lab Results  Component Value Date   CREATININE 0.98 04/12/2020   BUN 19 04/12/2020   NA 138 04/12/2020   K 4.1 04/12/2020   CL 103 04/12/2020   CO2 27 04/12/2020   Lab Results  Component Value Date   ALT 12 04/12/2020   AST 15 04/12/2020   ALKPHOS 49 04/12/2020   BILITOT 0.7 04/12/2020   Lab Results  Component Value Date   HGBA1C 5.2 04/03/2020   HGBA1C 5.1 05/23/2019   HGBA1C 5.4 09/30/2018   HGBA1C 5.4 04/19/2018   HGBA1C 5.3 12/21/2017   Lab Results  Component Value Date   INSULIN 4.1 04/03/2020   INSULIN 7.6 05/23/2019   INSULIN 7.9 04/19/2018   INSULIN 8.6 12/21/2017   INSULIN 10.3 08/26/2017   Lab Results  Component Value Date   TSH 4.03 07/14/2017   Lab Results  Component Value Date   CHOL 177 04/03/2020   HDL 59 04/03/2020   LDLCALC 103 (H) 04/03/2020   TRIG 80 04/03/2020   CHOLHDL 3.4 09/30/2018   Lab Results  Component Value Date   VD25OH 63.7 04/03/2020   VD25OH 36.0 05/23/2019   VD25OH 47.5 09/30/2018   Lab Results  Component Value Date   WBC 7.2 08/22/2020   HGB 12.6 08/22/2020   HCT 39.0 08/22/2020   MCV 100.8 (H) 08/22/2020   PLT 208 08/22/2020   Lab Results  Component Value Date   IRON 109 04/03/2020   TIBC 268 04/03/2020   FERRITIN  110 04/03/2020   Attestation Statements:   Reviewed by clinician on day of visit: allergies, medications, problem list, medical history, surgical history, family history, social history, and previous encounter notes.  I, Lizbeth Bark, RMA, am acting as Location manager for [employer], [place].   I have reviewed the above documentation for accuracy and completeness, and I agree with the above. -  Georgianne Fick, FNP

## 2021-04-12 LAB — CBC WITH DIFFERENTIAL/PLATELET
Basophils Absolute: 0 10*3/uL (ref 0.0–0.2)
Basos: 1 %
EOS (ABSOLUTE): 0.1 10*3/uL (ref 0.0–0.4)
Eos: 1 %
Hematocrit: 38.2 % (ref 34.0–46.6)
Hemoglobin: 13.2 g/dL (ref 11.1–15.9)
Immature Grans (Abs): 0 10*3/uL (ref 0.0–0.1)
Immature Granulocytes: 0 %
Lymphocytes Absolute: 2 10*3/uL (ref 0.7–3.1)
Lymphs: 31 %
MCH: 32.9 pg (ref 26.6–33.0)
MCHC: 34.6 g/dL (ref 31.5–35.7)
MCV: 95 fL (ref 79–97)
Monocytes Absolute: 0.4 10*3/uL (ref 0.1–0.9)
Monocytes: 6 %
Neutrophils Absolute: 3.9 10*3/uL (ref 1.4–7.0)
Neutrophils: 61 %
Platelets: 238 10*3/uL (ref 150–450)
RBC: 4.01 x10E6/uL (ref 3.77–5.28)
RDW: 11.9 % (ref 11.7–15.4)
WBC: 6.5 10*3/uL (ref 3.4–10.8)

## 2021-04-12 LAB — VITAMIN B12: Vitamin B-12: 536 pg/mL (ref 232–1245)

## 2021-04-12 LAB — COMPREHENSIVE METABOLIC PANEL
ALT: 8 IU/L (ref 0–32)
AST: 14 IU/L (ref 0–40)
Albumin/Globulin Ratio: 1.8 (ref 1.2–2.2)
Albumin: 4.2 g/dL (ref 3.8–4.8)
Alkaline Phosphatase: 68 IU/L (ref 44–121)
BUN/Creatinine Ratio: 15 (ref 9–23)
BUN: 12 mg/dL (ref 6–24)
Bilirubin Total: 0.4 mg/dL (ref 0.0–1.2)
CO2: 24 mmol/L (ref 20–29)
Calcium: 9.2 mg/dL (ref 8.7–10.2)
Chloride: 105 mmol/L (ref 96–106)
Creatinine, Ser: 0.78 mg/dL (ref 0.57–1.00)
Globulin, Total: 2.4 g/dL (ref 1.5–4.5)
Glucose: 77 mg/dL (ref 70–99)
Potassium: 5.2 mmol/L (ref 3.5–5.2)
Sodium: 141 mmol/L (ref 134–144)
Total Protein: 6.6 g/dL (ref 6.0–8.5)
eGFR: 94 mL/min/{1.73_m2} (ref >59)

## 2021-04-12 LAB — LIPID PANEL WITH LDL/HDL RATIO
Cholesterol, Total: 163 mg/dL (ref 100–199)
HDL: 57 mg/dL (ref >39)
LDL Chol Calc (NIH): 89 mg/dL (ref 0–99)
LDL/HDL Ratio: 1.6 ratio (ref 0.0–3.2)
Triglycerides: 89 mg/dL (ref 0–149)
VLDL Cholesterol Cal: 17 mg/dL (ref 5–40)

## 2021-04-12 LAB — HEMOGLOBIN A1C
Est. average glucose Bld gHb Est-mCnc: 108 mg/dL
Hgb A1c MFr Bld: 5.4 % (ref 4.8–5.6)

## 2021-04-12 LAB — VITAMIN D 25 HYDROXY (VIT D DEFICIENCY, FRACTURES): Vit D, 25-Hydroxy: 59.3 ng/mL (ref 30.0–100.0)

## 2021-04-12 LAB — VITAMIN B1: Thiamine: 144.4 nmol/L (ref 66.5–200.0)

## 2021-04-12 LAB — INSULIN, RANDOM: INSULIN: 2.5 u[IU]/mL — ABNORMAL LOW (ref 2.6–24.9)

## 2021-04-16 ENCOUNTER — Other Ambulatory Visit: Payer: Self-pay

## 2021-04-16 MED FILL — Norethin-Eth Estradiol-Fe Tab 1 MG-10 MCG (24)/10 MCG (2): ORAL | 84 days supply | Qty: 84 | Fill #1 | Status: AC

## 2021-04-22 ENCOUNTER — Other Ambulatory Visit: Payer: Self-pay

## 2021-04-26 ENCOUNTER — Encounter (HOSPITAL_COMMUNITY): Payer: Self-pay | Admitting: *Deleted

## 2021-05-07 DIAGNOSIS — M79671 Pain in right foot: Secondary | ICD-10-CM | POA: Diagnosis not present

## 2021-05-14 DIAGNOSIS — M7741 Metatarsalgia, right foot: Secondary | ICD-10-CM | POA: Diagnosis not present

## 2021-05-15 ENCOUNTER — Other Ambulatory Visit: Payer: Self-pay | Admitting: Orthopaedic Surgery

## 2021-05-15 DIAGNOSIS — M7741 Metatarsalgia, right foot: Secondary | ICD-10-CM

## 2021-05-26 ENCOUNTER — Telehealth: Payer: 59 | Admitting: Physician Assistant

## 2021-05-26 DIAGNOSIS — B9689 Other specified bacterial agents as the cause of diseases classified elsewhere: Secondary | ICD-10-CM

## 2021-05-26 DIAGNOSIS — J019 Acute sinusitis, unspecified: Secondary | ICD-10-CM

## 2021-05-26 MED ORDER — DOXYCYCLINE HYCLATE 100 MG PO CAPS: 100.0000 mg | ORAL_CAPSULE | Freq: Two times a day (BID) | ORAL | 0 refills | Status: DC

## 2021-05-26 NOTE — Progress Notes (Signed)
E-Visit for Sinus Problems  We are sorry that you are not feeling well.  Here is how we plan to help!  Based on what you have shared with me it looks like you have sinusitis.  Sinusitis is inflammation and infection in the sinus cavities of the head.  Based on your presentation I believe you most likely have Acute Bacterial Sinusitis.  This is an infection caused by bacteria and is treated with antibiotics. I have prescribed Doxycycline 100mg  by mouth twice a day for 10 days. You may use an oral decongestant such as Mucinex D or if you have glaucoma or high blood pressure use plain Mucinex. Saline nasal spray help and can safely be used as often as needed for congestion.  If you develop worsening sinus pain, fever or notice severe headache and vision changes, or if symptoms are not better after completion of antibiotic, please schedule an appointment with a health care provider.    Sinus infections are not as easily transmitted as other respiratory infection, however we still recommend that you avoid close contact with loved ones, especially the very young and elderly.  Remember to wash your hands thoroughly throughout the day as this is the number one way to prevent the spread of infection!  Home Care: Only take medications as instructed by your medical team. Complete the entire course of an antibiotic. Do not take these medications with alcohol. A steam or ultrasonic humidifier can help congestion.  You can place a towel over your head and breathe in the steam from hot water coming from a faucet. Avoid close contacts especially the very young and the elderly. Cover your mouth when you cough or sneeze. Always remember to wash your hands.  Get Help Right Away If: You develop worsening fever or sinus pain. You develop a severe head ache or visual changes. Your symptoms persist after you have completed your treatment plan.  Make sure you Understand these instructions. Will watch your  condition. Will get help right away if you are not doing well or get worse.  Thank you for choosing an e-visit.  Your e-visit answers were reviewed by a board certified advanced clinical practitioner to complete your personal care plan. Depending upon the condition, your plan could have included both over the counter or prescription medications.  Please review your pharmacy choice. Make sure the pharmacy is open so you can pick up prescription now. If there is a problem, you may contact your provider through [employer] and have the prescription routed to another pharmacy.  Your safety is important to [name]. If you have drug allergies check your prescription carefully.   For the next 24 hours you can use MyChart to ask questions about today's visit, request a non-urgent call back, or ask for a work or school excuse. You will get an email in the next two days asking about your experience. I hope that your e-visit has been valuable and will speed your recovery.  Greater than 5 minutes, yet less than 10 minutes of time have been spent researching, coordinating, and implementing care for this patient today

## 2021-05-27 ENCOUNTER — Ambulatory Visit
Admission: RE | Admit: 2021-05-27 | Discharge: 2021-05-27 | Disposition: A | Discharge status: Home / Self Care | Payer: 59 | Source: Ambulatory Visit | Attending: Orthopaedic Surgery | Admitting: Orthopaedic Surgery

## 2021-05-27 ENCOUNTER — Other Ambulatory Visit: Payer: Self-pay

## 2021-05-27 DIAGNOSIS — M7741 Metatarsalgia, right foot: Secondary | ICD-10-CM | POA: Insufficient documentation

## 2021-05-27 DIAGNOSIS — R600 Localized edema: Secondary | ICD-10-CM | POA: Diagnosis not present

## 2021-05-27 DIAGNOSIS — M79671 Pain in right foot: Secondary | ICD-10-CM | POA: Diagnosis not present

## 2021-05-30 DIAGNOSIS — M2021 Hallux rigidus, right foot: Secondary | ICD-10-CM | POA: Diagnosis not present

## 2021-06-12 DIAGNOSIS — Z8639 Personal history of other endocrine, nutritional and metabolic disease: Secondary | ICD-10-CM | POA: Diagnosis not present

## 2021-06-12 DIAGNOSIS — Z9884 Bariatric surgery status: Secondary | ICD-10-CM | POA: Diagnosis not present

## 2021-07-02 ENCOUNTER — Encounter (INDEPENDENT_AMBULATORY_CARE_PROVIDER_SITE_OTHER): Payer: Self-pay | Admitting: Adult Health

## 2021-07-02 ENCOUNTER — Ambulatory Visit (INDEPENDENT_AMBULATORY_CARE_PROVIDER_SITE_OTHER): Payer: 59 | Admitting: Adult Health

## 2021-07-02 ENCOUNTER — Other Ambulatory Visit: Payer: Self-pay

## 2021-07-02 VITALS — BP 105/67 | HR 63 | Temp 98.2°F | Ht 66.0 in | Wt 190.0 lb

## 2021-07-02 DIAGNOSIS — Z6830 Body mass index (BMI) 30.0-30.9, adult: Secondary | ICD-10-CM | POA: Diagnosis not present

## 2021-07-02 DIAGNOSIS — Z9189 Other specified personal risk factors, not elsewhere classified: Secondary | ICD-10-CM

## 2021-07-02 DIAGNOSIS — F3289 Other specified depressive episodes: Secondary | ICD-10-CM | POA: Diagnosis not present

## 2021-07-02 DIAGNOSIS — Z6841 Body Mass Index (BMI) 40.0 and over, adult: Secondary | ICD-10-CM

## 2021-07-02 DIAGNOSIS — Z9884 Bariatric surgery status: Secondary | ICD-10-CM | POA: Diagnosis not present

## 2021-07-02 DIAGNOSIS — E669 Obesity, unspecified: Secondary | ICD-10-CM | POA: Diagnosis not present

## 2021-07-02 DIAGNOSIS — E7800 Pure hypercholesterolemia, unspecified: Secondary | ICD-10-CM | POA: Diagnosis not present

## 2021-07-02 DIAGNOSIS — E8881 Metabolic syndrome: Secondary | ICD-10-CM | POA: Diagnosis not present

## 2021-07-02 MED ORDER — ESCITALOPRAM OXALATE 10 MG PO TABS
ORAL_TABLET | Freq: Every day | ORAL | 1 refills | Status: DC
  Filled 2021-07-02: qty 90, 90d supply, fill #0
  Filled 2021-10-24: qty 90, 90d supply, fill #1

## 2021-07-02 NOTE — Progress Notes (Addendum)
Chief Complaint:   OBESITY Wanda Peters is here to discuss her progress with her obesity treatment plan along with follow-up of her obesity related diagnoses. Wanda Peters is on keeping a food journal and adhering to recommended goals of 1400-1500 calories and 90 grams of protein and states she is following her eating plan approximately 90% of the time. Wanda Peters states she is doing cardio/weights for 60-80 minutes 3 times per week.  Today's visit was #: 67 Starting weight: 281 lbs Starting date: 08/26/2017 Today's weight: 190 lbs Today's date: 07/02/2021 Total lbs lost to date: 91 lbs Total lbs lost since last in-office visit: 3 lbs  Interim History:  Wanda Peters's visceral adipose rating : 9, at goal. Fat Mass 78 lbs, Muscle Mass 106 lbs;  MM > FM  106 lbs > 78 lbs.  Ultimate goal weight 175 lbs, current weight 190 lbs.  On 10/10/2019, laparoscopic gastric sleeve gastrectomy - preop weight 259 lbs. She had follow-up with her bariatric surgeon on 06/12/2021 - Dr. Marvia Pickles. Dr. Redmond Pulling would like a ferritin level checked today, he reviewed all other labs completed here on 04/09/21.  Subjective:   1. Elevated LDL cholesterol level Discussed labs with patient today.  On 04/09/2021, lipid panel - at goal. She denies family history of HLD/T2D. ASCVD risk stratification 0.4%.  2. Insulin resistance Discussed labs with patient today.  On 04/09/2021, A1c 5.4, BG 77, low/normal insulin - 2.5. She denies polyphagia, will experience symptoms of hypoglycemia with decreased oral intake.  3. Status post laparoscopic sleeve gastrectomy/Intestinal malabsorption, unspecified type Dr. Gwenevere Abbot surgery requests ferritin level check. She denies excessive fatigue. She is on OPURITY QD since May 2021 bariatric surgery.  4. Other depression, with emotional eating  She has attempted to wean off Lexapro in the past, which caused significant insomnia. She is on Lexapro 10 mg daily - denies  SI/HI, reports stable mood.  5. At risk for hypoglycemia Wanda Peters is at increased risk for hypoglycemia due to steady weight loss and low/normal insulin level.  Assessment/Plan:   1. Elevated LDL cholesterol level Continue with healthy eating and regular exercise.  2. Insulin resistance Monitor labs, eat every few hours.  3. Status post laparoscopic sleeve gastrectomy/Intestinal malabsorption, unspecified type Check ferritin.  - Ferritin  4. Other depression, with emotional eating  Refill Lexapro 10 mg daily.  - Refill escitalopram (LEXAPRO) 10 MG tablet; TAKE 1 TABLET (10 MG TOTAL) BY MOUTH DAILY.  Dispense: 90 tablet; Refill: 1  5. At risk for hypoglycemia Wanda Peters was given approximately 15 minutes of counseling today regarding prevention of hypoglycemia. She was advised of symptoms of hypoglycemia. Wanda Peters was instructed to avoid skipping meals, eat regular protein rich meals and schedule low calorie snacks as needed.   Repetitive spaced learning was employed today to elicit superior memory formation and behavioral change   6. Obesity: current BMI 30.9  Wanda Peters is currently in the action stage of change. As such, her goal is to continue with weight loss efforts. She has agreed to practicing portion control and making smarter food choices, such as increasing vegetables and decreasing simple carbohydrates.   Exercise goals:  As is.  Behavioral modification strategies: increasing lean protein intake, decreasing simple carbohydrates, meal planning and cooking strategies, keeping healthy foods in the home, planning for success, and keeping a strict food journal.  Wanda Peters has agreed to follow-up with our clinic in 12 weeks. She was informed of the importance of frequent follow-up visits to maximize her success with intensive lifestyle modifications for  her multiple health conditions.   Wanda Peters was informed we would discuss her lab results at her next visit unless there  is a critical issue that needs to be addressed sooner. Wanda Peters agreed to keep her next visit at the agreed upon time to discuss these results.  Objective:   Blood pressure 105/67, pulse 63, temperature 98.2 F (36.8 C), height 5\' 6"  (1.676 m), weight 190 lb (86.2 kg), SpO2 98 %. Body mass index is 30.67 kg/m.  General: Cooperative, alert, well developed, in no acute distress. HEENT: Conjunctivae and lids unremarkable. Cardiovascular: Regular rhythm.  Lungs: Normal work of breathing. Neurologic: No focal deficits.   Lab Results  Component Value Date   CREATININE 0.78 04/09/2021   BUN 12 04/09/2021   NA 141 04/09/2021   K 5.2 04/09/2021   CL 105 04/09/2021   CO2 24 04/09/2021   Lab Results  Component Value Date   ALT 8 04/09/2021   AST 14 04/09/2021   ALKPHOS 68 04/09/2021   BILITOT 0.4 04/09/2021   Lab Results  Component Value Date   HGBA1C 5.4 04/09/2021   HGBA1C 5.2 04/03/2020   HGBA1C 5.1 05/23/2019   HGBA1C 5.4 09/30/2018   HGBA1C 5.4 04/19/2018   Lab Results  Component Value Date   INSULIN 2.5 (L) 04/09/2021   INSULIN 4.1 04/03/2020   INSULIN 7.6 05/23/2019   INSULIN 7.9 04/19/2018   INSULIN 8.6 12/21/2017   Lab Results  Component Value Date   TSH 4.03 07/14/2017   Lab Results  Component Value Date   CHOL 163 04/09/2021   HDL 57 04/09/2021   LDLCALC 89 04/09/2021   TRIG 89 04/09/2021   CHOLHDL 3.4 09/30/2018   Lab Results  Component Value Date   VD25OH 59.3 04/09/2021   VD25OH 63.7 04/03/2020   VD25OH 36.0 05/23/2019   Lab Results  Component Value Date   WBC 6.5 04/09/2021   HGB 13.2 04/09/2021   HCT 38.2 04/09/2021   MCV 95 04/09/2021   PLT 238 04/09/2021   Lab Results  Component Value Date   IRON 109 04/03/2020   TIBC 268 04/03/2020   FERRITIN 110 04/03/2020   Attestation Statements:   Reviewed by clinician on day of visit: allergies, medications, problem list, medical history, surgical history, family history, social history,  and previous encounter notes.  I, Water quality scientist, CMA, am acting as Location manager for Mina Marble, NP.  I have reviewed the above documentation for accuracy and completeness, and I agree with the above. -  Vada Yellen d. Hodaya Curto, NP-C

## 2021-07-03 LAB — FERRITIN: Ferritin: 124 ng/mL (ref 15–150)

## 2021-07-03 NOTE — Progress Notes (Signed)
Done

## 2021-07-15 ENCOUNTER — Other Ambulatory Visit: Payer: Self-pay

## 2021-07-16 ENCOUNTER — Other Ambulatory Visit: Payer: Self-pay

## 2021-07-16 MED ORDER — LO LOESTRIN FE 1 MG-10 MCG / 10 MCG PO TABS
1.0000 | ORAL_TABLET | Freq: Every day | ORAL | 0 refills | Status: DC
  Filled 2021-07-16: qty 84, 84d supply, fill #0

## 2021-09-11 ENCOUNTER — Telehealth: Payer: 59 | Admitting: Nurse Practitioner

## 2021-09-11 ENCOUNTER — Other Ambulatory Visit (HOSPITAL_BASED_OUTPATIENT_CLINIC_OR_DEPARTMENT_OTHER): Payer: Self-pay

## 2021-09-11 DIAGNOSIS — J0100 Acute maxillary sinusitis, unspecified: Secondary | ICD-10-CM | POA: Diagnosis not present

## 2021-09-11 MED ORDER — AMOXICILLIN-POT CLAVULANATE 875-125 MG PO TABS
1.0000 | ORAL_TABLET | Freq: Two times a day (BID) | ORAL | 0 refills | Status: DC
  Filled 2021-09-11: qty 14, 7d supply, fill #0

## 2021-09-11 NOTE — Progress Notes (Signed)

## 2021-09-26 ENCOUNTER — Ambulatory Visit (INDEPENDENT_AMBULATORY_CARE_PROVIDER_SITE_OTHER): Payer: 59 | Admitting: Adult Health

## 2021-09-26 ENCOUNTER — Other Ambulatory Visit: Payer: Self-pay

## 2021-09-26 MED ORDER — LO LOESTRIN FE 1 MG-10 MCG / 10 MCG PO TABS
1.0000 | ORAL_TABLET | Freq: Every day | ORAL | 0 refills | Status: DC
  Filled 2021-09-26: qty 84, 84d supply, fill #0

## 2021-10-21 DIAGNOSIS — M25551 Pain in right hip: Secondary | ICD-10-CM | POA: Diagnosis not present

## 2021-10-21 DIAGNOSIS — S7001XA Contusion of right hip, initial encounter: Secondary | ICD-10-CM | POA: Diagnosis not present

## 2021-10-21 DIAGNOSIS — M25531 Pain in right wrist: Secondary | ICD-10-CM | POA: Diagnosis not present

## 2021-10-23 ENCOUNTER — Encounter (INDEPENDENT_AMBULATORY_CARE_PROVIDER_SITE_OTHER): Payer: Self-pay | Admitting: Adult Health

## 2021-10-24 ENCOUNTER — Telehealth (INDEPENDENT_AMBULATORY_CARE_PROVIDER_SITE_OTHER): Payer: Self-pay | Admitting: Adult Health

## 2021-10-24 ENCOUNTER — Other Ambulatory Visit: Payer: Self-pay

## 2021-10-24 NOTE — Telephone Encounter (Signed)
Called pt, no voicemail box set up to leave a message.   Per chart her last LEXAPRO was written 07/02/21 #90 with 1 refill.  Take 1 tablet daily.  Pt should be able to pick up the refill, it was due for refill 09/30/21.

## 2021-11-05 ENCOUNTER — Other Ambulatory Visit: Payer: Self-pay

## 2021-11-05 DIAGNOSIS — Z6831 Body mass index (BMI) 31.0-31.9, adult: Secondary | ICD-10-CM | POA: Diagnosis not present

## 2021-11-05 DIAGNOSIS — Z1151 Encounter for screening for human papillomavirus (HPV): Secondary | ICD-10-CM | POA: Diagnosis not present

## 2021-11-05 DIAGNOSIS — Z1231 Encounter for screening mammogram for malignant neoplasm of breast: Secondary | ICD-10-CM | POA: Diagnosis not present

## 2021-11-05 DIAGNOSIS — Z01419 Encounter for gynecological examination (general) (routine) without abnormal findings: Secondary | ICD-10-CM | POA: Diagnosis not present

## 2021-11-05 DIAGNOSIS — N83209 Unspecified ovarian cyst, unspecified side: Secondary | ICD-10-CM | POA: Diagnosis not present

## 2021-11-05 DIAGNOSIS — Z124 Encounter for screening for malignant neoplasm of cervix: Secondary | ICD-10-CM | POA: Diagnosis not present

## 2021-11-05 MED ORDER — LO LOESTRIN FE 1 MG-10 MCG / 10 MCG PO TABS
1.0000 | ORAL_TABLET | Freq: Every day | ORAL | 4 refills | Status: DC
  Filled 2021-11-05 – 2021-12-26 (×2): qty 84, 84d supply, fill #0
  Filled 2022-02-24: qty 84, 84d supply, fill #1
  Filled 2022-06-16: qty 84, 84d supply, fill #2
  Filled 2022-09-07: qty 84, 84d supply, fill #3
  Filled 2022-10-29: qty 84, 84d supply, fill #4

## 2021-11-06 ENCOUNTER — Ambulatory Visit (INDEPENDENT_AMBULATORY_CARE_PROVIDER_SITE_OTHER): Payer: 59 | Admitting: Adult Health

## 2021-11-06 ENCOUNTER — Encounter (INDEPENDENT_AMBULATORY_CARE_PROVIDER_SITE_OTHER): Payer: Self-pay | Admitting: Adult Health

## 2021-11-06 ENCOUNTER — Other Ambulatory Visit: Payer: Self-pay

## 2021-11-06 VITALS — BP 95/60 | HR 59 | Temp 98.3°F | Ht 66.0 in | Wt 196.0 lb

## 2021-11-06 DIAGNOSIS — E559 Vitamin D deficiency, unspecified: Secondary | ICD-10-CM | POA: Diagnosis not present

## 2021-11-06 DIAGNOSIS — Z6831 Body mass index (BMI) 31.0-31.9, adult: Secondary | ICD-10-CM

## 2021-11-06 DIAGNOSIS — E669 Obesity, unspecified: Secondary | ICD-10-CM

## 2021-11-06 DIAGNOSIS — F3289 Other specified depressive episodes: Secondary | ICD-10-CM

## 2021-11-06 MED ORDER — ESCITALOPRAM OXALATE 10 MG PO TABS
ORAL_TABLET | Freq: Every day | ORAL | 1 refills | Status: DC
Start: 2021-11-06 — End: 2022-03-26
  Filled 2021-11-06: qty 90, fill #0
  Filled 2022-01-17: qty 90, 90d supply, fill #0
  Filled 2022-02-26: qty 90, 90d supply, fill #1

## 2021-11-06 MED ORDER — WEGOVY 0.25 MG/0.5ML ~~LOC~~ SOAJ
0.2500 mg | SUBCUTANEOUS | 0 refills | Status: DC
  Filled 2021-11-06: qty 2, fill #0
  Filled 2022-01-17 – 2022-03-19 (×3): qty 2, 28d supply, fill #0

## 2021-11-11 NOTE — Progress Notes (Signed)
Chief Complaint:   OBESITY Wanda Peters is here to discuss her progress with her obesity treatment plan along with follow-up of her obesity related diagnoses. Wanda Peters is on practicing portion control and making smarter food choices, such as increasing vegetables and decreasing simple carbohydrates and states she is following her eating plan approximately 90% of the time. Wanda Peters states she is doing interval training 60 minutes 3 times per week.  Today's visit was #: 79 Starting weight: 281 lbs Starting date: 08/26/2017 Today's weight: 196 lbs Today's date: 11/06/21 Total lbs lost to date: 85 lbs Total lbs lost since last in-office visit: +6  Interim History:  She is currently in maintenance phase-seen at [facility] every 12 weeks. She endorses increased "impulsive eating"- i.e. increased portion size of snacks and meals. Reviewed bioimpedance results with patient. Muscle mass + 2.8 pounds, adipose mass +3 pounds, water weight + 0.8 pounds.  Subjective:   1. Vitamin D deficiency 04/09/21 Vit D Level 59.3-stable She is on OTC Vit D3 2,000 IU QD.  2. Other depression, with emotional eating  She takes Lexapro 10 mg every night-improve sleep. 7 uninterrupted hours of sleep with nightly SSRI. When she was not on Lexapro- she estimates to have slept 3 to 4 hours/night. She reports stable mood, denies suicidal ideation/homicidal ideation.  Assessment/Plan:   1. Vitamin D deficiency Continue OTC vitamin D3 2,000 IU once daily.  2. Other depression, with emotional eating  Refill: - escitalopram (LEXAPRO) 10 MG tablet; TAKE 1 TABLET (10 MG TOTAL) BY MOUTH DAILY.  Dispense: 90 tablet; Refill: 1  3. Obesity: current BMI 31.8 Wanda Peters is currently in the action stage of change. As such, her goal is to continue with weight loss efforts. She has agreed to practicing portion control and making smarter food choices, such as increasing vegetables and decreasing simple  carbohydrates.   Start Wegovy: - Semaglutide-Weight Management (WEGOVY) 0.25 MG/0.5ML SOAJ; Inject 0.25 mg into the skin once a week.  Dispense: 2 mL; Refill: 0  Exercise goals: as is.  Behavioral modification strategies: increasing lean protein intake, decreasing simple carbohydrates, meal planning and cooking strategies, keeping healthy foods in the home, and planning for success.  Wanda Peters has agreed to follow-up with our clinic in 4 weeks. She was informed of the importance of frequent follow-up visits to maximize her success with intensive lifestyle modifications for her multiple health conditions.   Objective:   Blood pressure 95/60, pulse (!) 59, temperature 98.3 F (36.8 C), height '5\' 6"'$  (1.676 m), weight 196 lb (88.9 kg), SpO2 100 %. Body mass index is 31.64 kg/m.  General: Cooperative, alert, well developed, in no acute distress. HEENT: Conjunctivae and lids unremarkable. Cardiovascular: Regular rhythm.  Lungs: Normal work of breathing. Neurologic: No focal deficits.   Lab Results  Component Value Date   CREATININE 0.78 04/09/2021   BUN 12 04/09/2021   NA 141 04/09/2021   K 5.2 04/09/2021   CL 105 04/09/2021   CO2 24 04/09/2021   Lab Results  Component Value Date   ALT 8 04/09/2021   AST 14 04/09/2021   ALKPHOS 68 04/09/2021   BILITOT 0.4 04/09/2021   Lab Results  Component Value Date   HGBA1C 5.4 04/09/2021   HGBA1C 5.2 04/03/2020   HGBA1C 5.1 05/23/2019   HGBA1C 5.4 09/30/2018   HGBA1C 5.4 04/19/2018   Lab Results  Component Value Date   INSULIN 2.5 (L) 04/09/2021   INSULIN 4.1 04/03/2020   INSULIN 7.6 05/23/2019  INSULIN 7.9 04/19/2018   INSULIN 8.6 12/21/2017   Lab Results  Component Value Date   TSH 4.03 07/14/2017   Lab Results  Component Value Date   CHOL 163 04/09/2021   HDL 57 04/09/2021   LDLCALC 89 04/09/2021   TRIG 89 04/09/2021   CHOLHDL 3.4 09/30/2018   Lab Results  Component Value Date   VD25OH 59.3 04/09/2021   VD25OH  63.7 04/03/2020   VD25OH 36.0 05/23/2019   Lab Results  Component Value Date   WBC 6.5 04/09/2021   HGB 13.2 04/09/2021   HCT 38.2 04/09/2021   MCV 95 04/09/2021   PLT 238 04/09/2021   Lab Results  Component Value Date   IRON 109 04/03/2020   TIBC 268 04/03/2020   FERRITIN 124 07/02/2021    Attestation Statements:   Reviewed by clinician on day of visit: allergies, medications, problem list, medical history, surgical history, family history, social history, and previous encounter notes.  I, Georgianne Fick, FNP, am acting as Location manager for Mina Marble, NP.  I have reviewed the above documentation for accuracy and completeness, and I agree with the above. -  Taira Knabe d. Shareka Casale, NP-C

## 2021-11-25 ENCOUNTER — Other Ambulatory Visit: Payer: Self-pay

## 2021-11-28 ENCOUNTER — Encounter (INDEPENDENT_AMBULATORY_CARE_PROVIDER_SITE_OTHER): Payer: Self-pay | Admitting: Adult Health

## 2021-11-28 DIAGNOSIS — N912 Amenorrhea, unspecified: Secondary | ICD-10-CM | POA: Diagnosis not present

## 2021-11-28 DIAGNOSIS — R87612 Low grade squamous intraepithelial lesion on cytologic smear of cervix (LGSIL): Secondary | ICD-10-CM | POA: Diagnosis not present

## 2021-12-04 ENCOUNTER — Encounter (INDEPENDENT_AMBULATORY_CARE_PROVIDER_SITE_OTHER): Payer: Self-pay | Admitting: Adult Health

## 2021-12-05 ENCOUNTER — Ambulatory Visit (INDEPENDENT_AMBULATORY_CARE_PROVIDER_SITE_OTHER): Payer: 59 | Admitting: Adult Health

## 2021-12-11 DIAGNOSIS — N72 Inflammatory disease of cervix uteri: Secondary | ICD-10-CM | POA: Diagnosis not present

## 2021-12-19 ENCOUNTER — Other Ambulatory Visit: Payer: Self-pay | Admitting: Orthopedic Surgery

## 2021-12-19 DIAGNOSIS — M25531 Pain in right wrist: Secondary | ICD-10-CM | POA: Diagnosis not present

## 2021-12-19 DIAGNOSIS — M25551 Pain in right hip: Secondary | ICD-10-CM | POA: Diagnosis not present

## 2021-12-19 IMAGING — MR MR FOOT*R* W/O CM
5 series · 40 of 40 positions shown · non-contrast
Comparison: None.

CLINICAL DATA: Right foot pain on ball of the forefoot for 2-3
months.

EXAM:
MRI OF THE RIGHT FOREFOOT WITHOUT CONTRAST
TECHNIQUE: Multiplanar, multisequence MR imaging of the right forefoot was
performed. No intravenous contrast was administered.

[Series 3: STIR · sagittal · right · 3.0mm · 0.62mm/px · 8 of 31 slices shown]
[im 1/31]
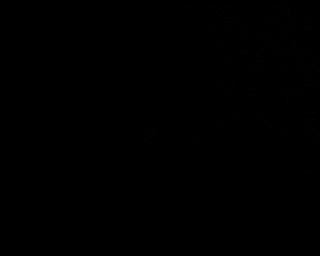
[im 5/31]
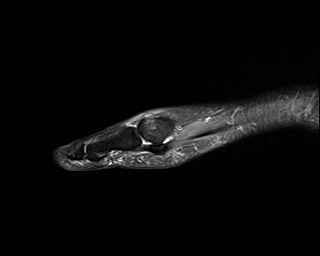
[im 9/31]
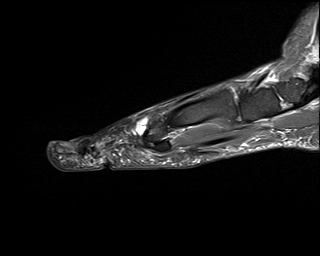
[im 13/31]
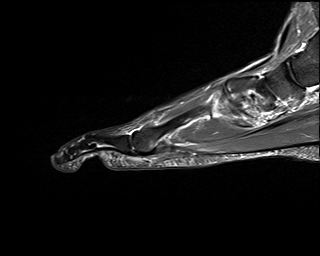
[im 18/31]
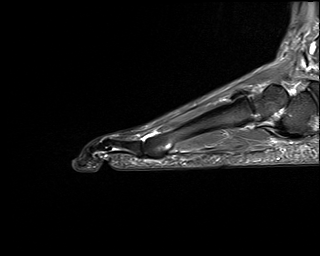
[im 22/31]
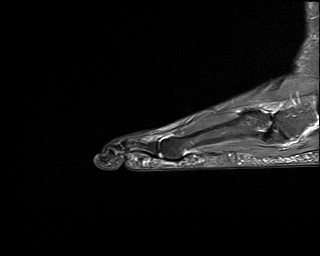
[im 26/31]
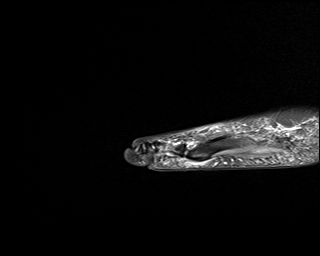
[im 31/31]
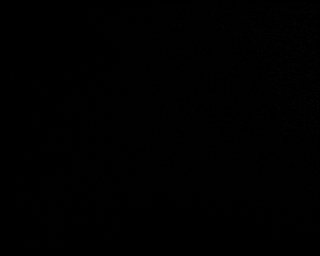

[Series 4: T1 · coronal · right · 3.0mm · 0.38mm/px · 11 of 45 slices shown (1 of 2)]
[im 1/45]
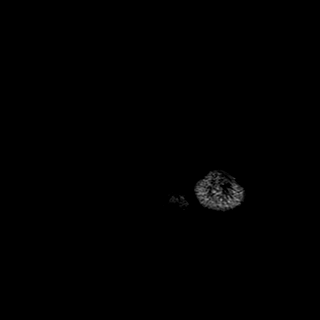
[im 5/45]
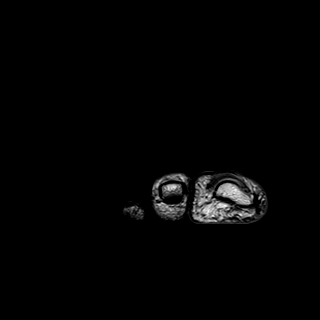
[im 9/45]
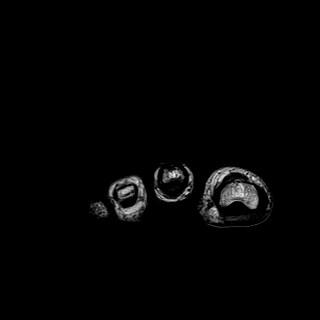
[im 14/45]
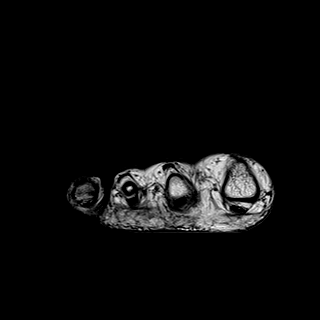
[im 18/45]
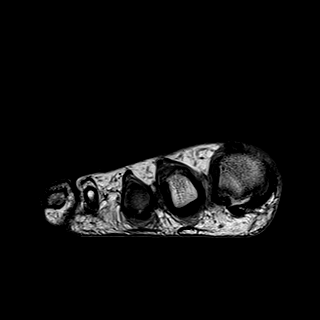
[im 23/45]
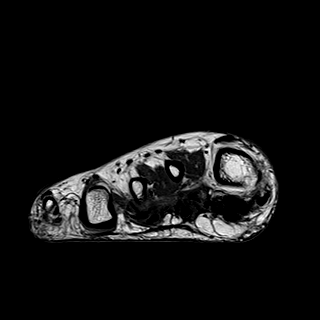
[im 27/45]
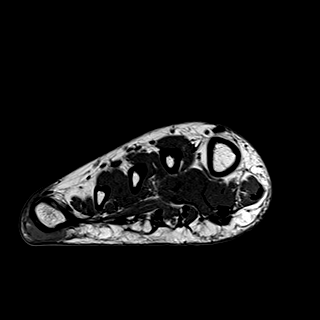
[im 31/45]
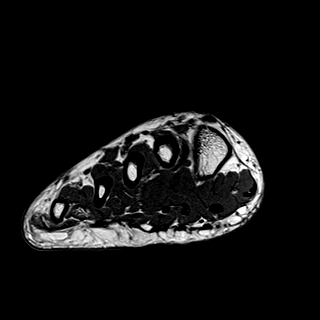
[im 36/45]
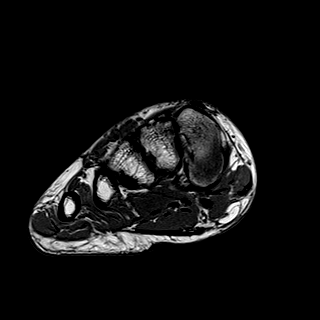
[im 40/45]
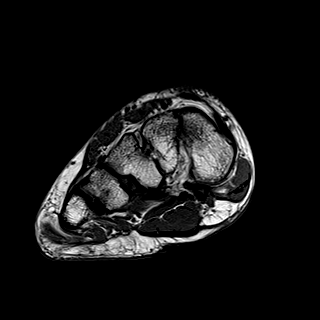
[im 45/45]
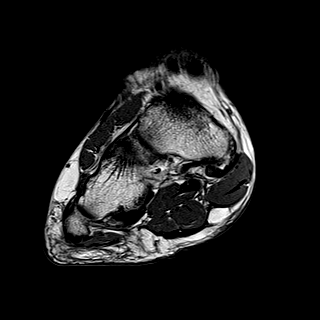

[Series 5: T1 · axial · right · 3.0mm · 0.70mm/px · z∈[-62,+12]mm · 5 of 20 slices shown (2 of 2)]
[im 1/20]
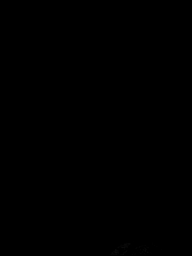
[im 5/20]
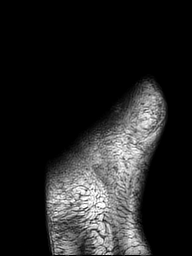
[im 10/20]
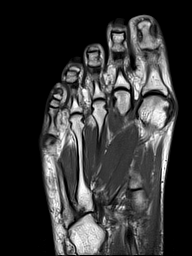
[im 15/20]
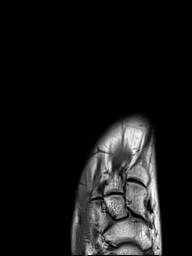
[im 20/20]
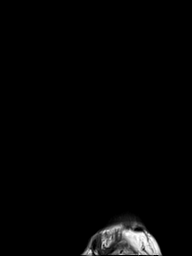

[Series 7: T2 · coronal · right · 3.0mm · 0.38mm/px · 11 of 45 slices shown (1 of 2)]
[im 1/45]
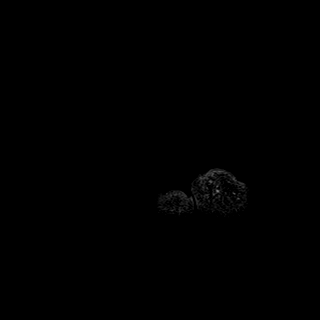
[im 5/45]
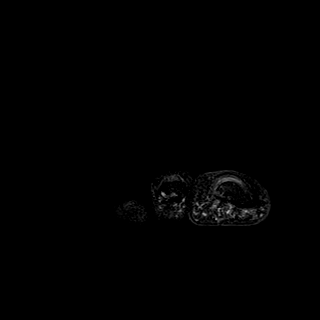
[im 9/45]
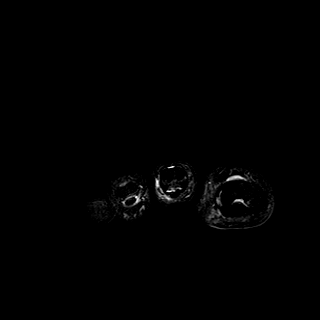
[im 14/45]
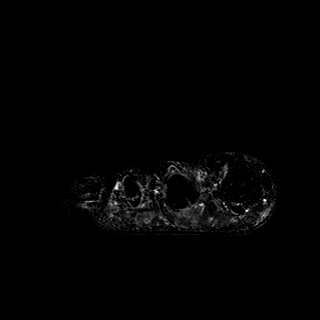
[im 18/45]
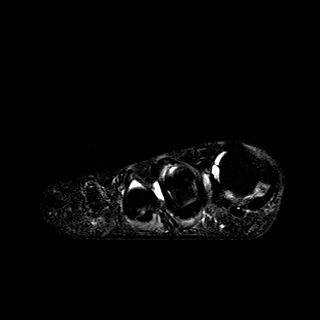
[im 23/45]
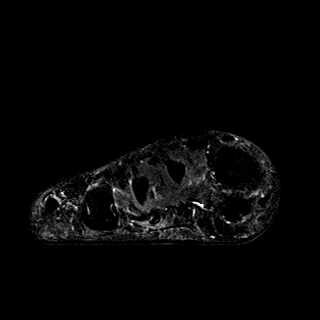
[im 27/45]
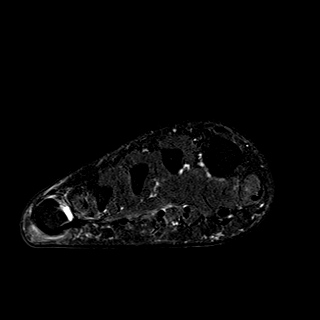
[im 31/45]
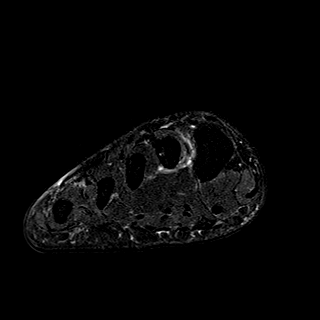
[im 36/45]
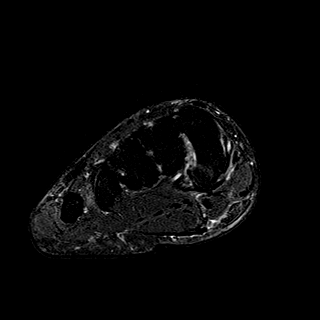
[im 40/45]
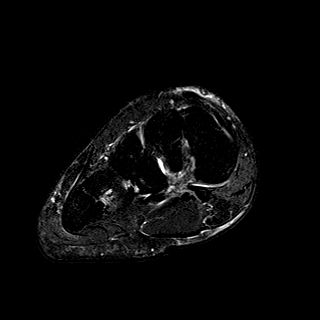
[im 45/45]
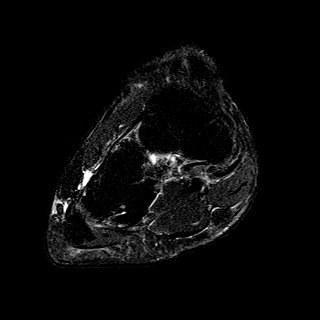

[Series 9: T2 · axial · right · 3.0mm · 0.70mm/px · z∈[-62,+12]mm · 5 of 20 slices shown (2 of 2)]
[im 1/20]
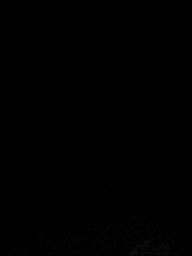
[im 5/20]
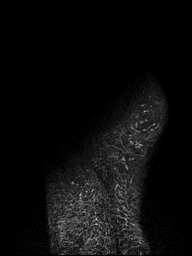
[im 10/20]
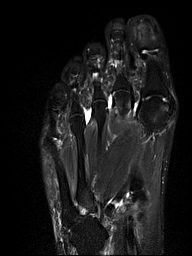
[im 15/20]
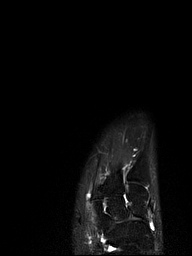
[im 20/20]
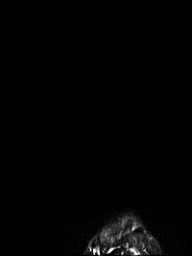

[40 of 40 positions shown; findings below may reference images not displayed]

FINDINGS: Bones/Joint/Cartilage

No fracture or dislocation. Normal alignment. No joint effusion.
Juxta-articular erosion and mild edema of the medial aspect of the
distal first metatarsal.

Ligaments

Collateral ligaments are intact. Lisfranc ligament is intact.
Plantar plates are intact.

Muscles and Tendons

Flexor, peroneal and extensor compartment tendons are intact.
Muscles are normal.

Soft tissue
No fluid collection or hematoma. No soft tissue mass. Mild
subcutaneous soft tissue edema about the medial aspect of the first
digit.
IMPRESSION: 1.  No evidence of fracture or dislocation.

2.  No evidence of plantar plate injury or collateral ligament tear.

3. Mild periarticular erosion on the medial aspect of the first
metatarsophalangeal joint with focal edema of the head of the first
metatarsal, it may be secondary to degenerative and/or inflammatory
arthropathy. Clinical correlation is suggested.

4.  Muscles are tendons are within normal limits.

## 2021-12-22 ENCOUNTER — Telehealth: Payer: 59 | Admitting: Family

## 2021-12-22 DIAGNOSIS — H109 Unspecified conjunctivitis: Secondary | ICD-10-CM

## 2021-12-22 MED ORDER — POLYMYXIN B-TRIMETHOPRIM 10000-0.1 UNIT/ML-% OP SOLN: 1.0000 [drp] | Freq: Four times a day (QID) | OPHTHALMIC | 0 refills | Status: DC

## 2021-12-22 NOTE — Progress Notes (Signed)

## 2021-12-26 ENCOUNTER — Ambulatory Visit
Admission: RE | Admit: 2021-12-26 | Discharge: 2021-12-26 | Disposition: A | Discharge status: Home / Self Care | Payer: 59 | Source: Ambulatory Visit | Attending: Orthopedic Surgery | Admitting: Orthopedic Surgery

## 2021-12-26 DIAGNOSIS — M25531 Pain in right wrist: Secondary | ICD-10-CM | POA: Diagnosis not present

## 2021-12-26 DIAGNOSIS — M25451 Effusion, right hip: Secondary | ICD-10-CM | POA: Diagnosis not present

## 2021-12-26 DIAGNOSIS — M189 Osteoarthritis of first carpometacarpal joint, unspecified: Secondary | ICD-10-CM | POA: Diagnosis not present

## 2021-12-26 DIAGNOSIS — M25551 Pain in right hip: Secondary | ICD-10-CM | POA: Diagnosis not present

## 2021-12-26 DIAGNOSIS — R600 Localized edema: Secondary | ICD-10-CM | POA: Diagnosis not present

## 2021-12-26 DIAGNOSIS — M1611 Unilateral primary osteoarthritis, right hip: Secondary | ICD-10-CM | POA: Diagnosis not present

## 2021-12-27 ENCOUNTER — Other Ambulatory Visit: Payer: Self-pay

## 2022-01-15 ENCOUNTER — Encounter (INDEPENDENT_AMBULATORY_CARE_PROVIDER_SITE_OTHER): Payer: Self-pay

## 2022-01-17 ENCOUNTER — Other Ambulatory Visit: Payer: Self-pay

## 2022-01-23 ENCOUNTER — Encounter (INDEPENDENT_AMBULATORY_CARE_PROVIDER_SITE_OTHER): Payer: Self-pay

## 2022-01-23 ENCOUNTER — Telehealth (INDEPENDENT_AMBULATORY_CARE_PROVIDER_SITE_OTHER): Payer: Self-pay | Admitting: Adult Health

## 2022-01-23 NOTE — Telephone Encounter (Signed)
Mina Marble - Prior authorization approved for [employer]. Effective: 01/23/2022 to 08/14/2022. Patient sent approval message via mychart.

## 2022-01-30 ENCOUNTER — Other Ambulatory Visit (HOSPITAL_COMMUNITY): Payer: Self-pay

## 2022-01-30 ENCOUNTER — Telehealth: Payer: 59 | Admitting: Physician Assistant

## 2022-01-30 DIAGNOSIS — U071 COVID-19: Secondary | ICD-10-CM | POA: Diagnosis not present

## 2022-01-30 MED ORDER — NIRMATRELVIR/RITONAVIR (PAXLOVID)TABLET: 3.0000 | ORAL_TABLET | Freq: Two times a day (BID) | ORAL | 0 refills | Status: AC

## 2022-01-30 NOTE — Patient Instructions (Signed)
Wanda Peters, thank you for joining Leeanne Rio, PA-C for today's virtual visit.  While this provider is not your primary care provider (PCP), if your PCP is located in our provider database this encounter information will be shared with them immediately following your visit.  Consent: (Patient) Wanda Peters provided verbal consent for this virtual visit at the beginning of the encounter.  Current Medications:  Current Outpatient Medications:    CALCIUM PO, Take 1 tablet by mouth daily., Disp: , Rfl:    cholecalciferol (VITAMIN D3) 25 MCG (1000 UNIT) tablet, Take 2,000 Units by mouth daily., Disp: , Rfl:    escitalopram (LEXAPRO) 10 MG tablet, TAKE 1 TABLET (10 MG TOTAL) BY MOUTH DAILY., Disp: 90 tablet, Rfl: 1   Multiple Vitamins-Minerals (OPURITY PO), Take 1 tablet by mouth daily., Disp: , Rfl:    Norethindrone-Ethinyl Estradiol-Fe Biphas (LO LOESTRIN FE) 1 MG-10 MCG / 10 MCG tablet, TAKE 1 TABLET BY MOUTH ONCE DAILY, Disp: 84 tablet, Rfl: 4   Norethindrone-Ethinyl Estradiol-Fe Biphas (LO LOESTRIN FE) 1 MG-10 MCG / 10 MCG tablet, TAKE 1 TABLET BY MOUTH ONCE DAILY, Disp: 84 tablet, Rfl: 4   Semaglutide-Weight Management (WEGOVY) 0.25 MG/0.5ML SOAJ, Inject 0.25 mg into the skin once a week., Disp: 2 mL, Rfl: 0   trimethoprim-polymyxin b (POLYTRIM) ophthalmic solution, Place 1 drop into the left eye every 6 (six) hours., Disp: 10 mL, Rfl: 0   Medications ordered in this encounter:  No orders of the defined types were placed in this encounter.    *If you need refills on other medications prior to your next appointment, please contact your pharmacy*  Follow-Up: Call back or seek an in-person evaluation if the symptoms worsen or if the condition fails to improve as anticipated.  Other Instructions Please keep well-hydrated and get plenty of rest. Start a saline nasal rinse to flush out your nasal passages. You can use plain Mucinex to help thin congestion. If  you have a humidifier, running in the bedroom at night. I want you to start OTC vitamin D3 1000 units daily, vitamin C 1000 mg daily, and a zinc supplement. Please take prescribed medications as directed.  You have been enrolled in a MyChart symptom monitoring program. Please answer these questions daily so we can keep track of how you are doing.  You were to quarantine for 5 days from onset of your symptoms.  After day 5, if you have had no fever and you are feeling better, you can end quarantine but need to mask for an additional 5 days. After day 5 if you have a fever or are having significant symptoms, please quarantine for full 10 days.  If you note any worsening of symptoms, any significant shortness of breath or any chest pain, please seek ER evaluation ASAP.  Please do not delay care!  COVID-19: What to Do if You Are Sick If you test positive and are an older adult or someone who is at high risk of getting very sick from COVID-19, treatment may be available. Contact a healthcare provider right away after a positive test to determine if you are eligible, even if your symptoms are mild right now. You can also visit a Test to Treat location and, if eligible, receive a prescription from a provider. Don't delay: Treatment must be started within the first few days to be effective. If you have a fever, cough, or other symptoms, you might have COVID-19. Most people have mild illness and are able to  recover at home. If you are sick: Keep track of your symptoms. If you have an emergency warning sign (including trouble breathing), call 911. Steps to help prevent the spread of COVID-19 if you are sick If you are sick with COVID-19 or think you might have COVID-19, follow the steps below to care for yourself and to help protect other people in your home and community. Stay home except to get medical care Stay home. Most people with COVID-19 have mild illness and can recover at home without medical  care. Do not leave your home, except to get medical care. Do not visit public areas and do not go to places where you are unable to wear a mask. Take care of yourself. Get rest and stay hydrated. Take over-the-counter medicines, such as acetaminophen, to help you feel better. Stay in touch with your doctor. Call before you get medical care. Be sure to get care if you have trouble breathing, or have any other emergency warning signs, or if you think it is an emergency. Avoid public transportation, ride-sharing, or taxis if possible. Get tested If you have symptoms of COVID-19, get tested. While waiting for test results, stay away from others, including staying apart from those living in your household. Get tested as soon as possible after your symptoms start. Treatments may be available for people with COVID-19 who are at risk for becoming very sick. Don't delay: Treatment must be started early to be effective--some treatments must begin within 5 days of your first symptoms. Contact your healthcare provider right away if your test result is positive to determine if you are eligible. Self-tests are one of several options for testing for the virus that causes COVID-19 and may be more convenient than laboratory-based tests and point-of-care tests. Ask your healthcare provider or your local health department if you need help interpreting your test results. You can visit your state, tribal, local, and territorial health department's website to look for the latest local information on testing sites. Separate yourself from other people As much as possible, stay in a specific room and away from other people and pets in your home. If possible, you should use a separate bathroom. If you need to be around other people or animals in or outside of the home, wear a well-fitting mask. Tell your close contacts that they may have been exposed to COVID-19. An infected person can spread COVID-19 starting 48 hours (or 2 days)  before the person has any symptoms or tests positive. By letting your close contacts know they may have been exposed to COVID-19, you are helping to protect everyone. See COVID-19 and Animals if you have questions about pets. If you are diagnosed with COVID-19, someone from the health department may call you. Answer the call to slow the spread. Monitor your symptoms Symptoms of COVID-19 include fever, cough, or other symptoms. Follow care instructions from your healthcare provider and local health department. Your local health authorities may give instructions on checking your symptoms and reporting information. When to seek emergency medical attention Look for emergency warning signs* for COVID-19. If someone is showing any of these signs, seek emergency medical care immediately: Trouble breathing Persistent pain or pressure in the chest New confusion Inability to wake or stay awake Pale, gray, or blue-colored skin, lips, or nail beds, depending on skin tone *This list is not all possible symptoms. Please call your medical provider for any other symptoms that are severe or concerning to you. Call 911 or call ahead to  your local emergency facility: Notify the operator that you are seeking care for someone who has or may have COVID-19. Call ahead before visiting your doctor Call ahead. Many medical visits for routine care are being postponed or done by phone or telemedicine. If you have a medical appointment that cannot be postponed, call your doctor's office, and tell them you have or may have COVID-19. This will help the office protect themselves and other patients. If you are sick, wear a well-fitting mask You should wear a mask if you must be around other people or animals, including pets (even at home). Wear a mask with the best fit, protection, and comfort for you. You don't need to wear the mask if you are alone. If you can't put on a mask (because of trouble breathing, for example), cover  your coughs and sneezes in some other way. Try to stay at least 6 feet away from other people. This will help protect the people around you. Masks should not be placed on young children under age 73 years, anyone who has trouble breathing, or anyone who is not able to remove the mask without help. Cover your coughs and sneezes Cover your mouth and nose with a tissue when you cough or sneeze. Throw away used tissues in a lined trash can. Immediately wash your hands with soap and water for at least 20 seconds. If soap and water are not available, clean your hands with an alcohol-based hand sanitizer that contains at least 60% alcohol. Clean your hands often Wash your hands often with soap and water for at least 20 seconds. This is especially important after blowing your nose, coughing, or sneezing; going to the bathroom; and before eating or preparing food. Use hand sanitizer if soap and water are not available. Use an alcohol-based hand sanitizer with at least 60% alcohol, covering all surfaces of your hands and rubbing them together until they feel dry. Soap and water are the best option, especially if hands are visibly dirty. Avoid touching your eyes, nose, and mouth with unwashed hands. Handwashing Tips Avoid sharing personal household items Do not share dishes, drinking glasses, cups, eating utensils, towels, or bedding with other people in your home. Wash these items thoroughly after using them with soap and water or put in the dishwasher. Clean surfaces in your home regularly Clean and disinfect high-touch surfaces (for example, doorknobs, tables, handles, light switches, and countertops) in your "sick room" and bathroom. In shared spaces, you should clean and disinfect surfaces and items after each use by the person who is ill. If you are sick and cannot clean, a caregiver or other person should only clean and disinfect the area around you (such as your bedroom and bathroom) on an as needed  basis. Your caregiver/other person should wait as long as possible (at least several hours) and wear a mask before entering, cleaning, and disinfecting shared spaces that you use. Clean and disinfect areas that may have blood, stool, or body fluids on them. Use household cleaners and disinfectants. Clean visible dirty surfaces with household cleaners containing soap or detergent. Then, use a household disinfectant. Use a product from [employer] List N: Disinfectants for Coronavirus (MWNUU-72). Be sure to follow the instructions on the label to ensure safe and effective use of the product. Many products recommend keeping the surface wet with a disinfectant for a certain period of time (look at "contact time" on the product label). You may also need to wear personal protective equipment, such as gloves,  depending on the directions on the product label. Immediately after disinfecting, wash your hands with soap and water for 20 seconds. For completed guidance on cleaning and disinfecting your home, visit Complete Disinfection Guidance. Take steps to improve ventilation at home Improve ventilation (air flow) at home to help prevent from spreading COVID-19 to other people in your household. Clear out COVID-19 virus particles in the air by opening windows, using air filters, and turning on fans in your home. Use this interactive tool to learn how to improve air flow in your home. When you can be around others after being sick with COVID-19 Deciding when you can be around others is different for different situations. Find out when you can safely end home isolation. For any additional questions about your care, contact your healthcare provider or state or local health department. 08/28/2020 Content source: [facility] ([facility]), Division of Viral Diseases This information is not intended to replace advice given to you by your health care provider. Make sure you discuss  any questions you have with your health care provider. Document Revised: 10/11/2020 Document Reviewed: 10/11/2020 Elsevier Patient Education  2022 [employer].   If you have been instructed to have an in-person evaluation today at a local Urgent Care facility, please use the link below. It will take you to a list of all of our available [facility], including address, phone number and hours of operation. Please do not delay care.  [facility]  If you or a family member do not have a primary care provider, use the link below to schedule a visit and establish care. When you choose a [facility] primary care physician or advanced practice provider, you gain a long-term partner in health. Find a Primary Care Provider  Learn more about 's in-office and virtual care options: [facility]

## 2022-01-30 NOTE — Progress Notes (Signed)
Virtual Visit Consent   Wanda Peters, you are scheduled for a virtual visit with a [facility] provider today. Just as with appointments in the office, your consent must be obtained to participate. Your consent will be active for this visit and any virtual visit you may have with one of our providers in the next 365 days. If you have a MyChart account, a copy of this consent can be sent to you electronically.  As this is a virtual visit, video technology does not allow for your provider to perform a traditional examination. This may limit your provider's ability to fully assess your condition. If your provider identifies any concerns that need to be evaluated in person or the need to arrange testing (such as labs, EKG, etc.), we will make arrangements to do so. Although advances in technology are sophisticated, we cannot ensure that it will always work on either your end or our end. If the connection with a video visit is poor, the visit may have to be switched to a telephone visit. With either a video or telephone visit, we are not always able to ensure that we have a secure connection.  By engaging in this virtual visit, you consent to the provision of healthcare and authorize for your insurance to be billed (if applicable) for the services provided during this visit. Depending on your insurance coverage, you may receive a charge related to this service.  I need to obtain your verbal consent now. Are you willing to proceed with your visit today? Wanda Peters has provided verbal consent on 01/30/2022 for a virtual visit (video or telephone). Leeanne Rio, [place]  Date: 01/30/2022 3:16 PM  Virtual Visit via Video Note   I, Leeanne Rio, connected with  Wanda Peters  ([id number], May 30, 1976) on 01/30/22 at  3:15 PM EDT by a video-enabled telemedicine application and verified that I am speaking with the correct person using two identifiers.  Location: Patient:  Virtual Visit Location Patient: Home Provider: Virtual Visit Location Provider: Home Office   I discussed the limitations of evaluation and management by telemedicine and the availability of in person appointments. The patient expressed understanding and agreed to proceed.    History of Present Illness: Wanda Peters is a 46 y.o. who identifies as a female who was assigned female at birth, and is being seen today for COVID-19. Endorses symptoms starting yesterday with congestion, fatigue and diarrhea. Occasional cough. Denies fever, chest pain or SOB. Denies any melena or hematochezia. Is staying hydrated. Denies recent travel or sick contact. Took a home COVID test which came back positive.  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   Class 1 obesity with serious comorbidity and body mass index (BMI) of 33.0 to 33.9 in adult 09/20/2019   Peripheral edema 11/03/2018   Insulin resistance 09/06/2018   Other hyperlipidemia 09/06/2018   Depression 08/12/2018   Class 3 severe obesity with body mass index (BMI) of 40.0 to 44.9 in adult [facility]) 08/12/2018   Shortness of breath on exertion 08/26/2017   Vitamin D deficiency 08/26/2017   Acute bursitis of left shoulder 06/30/2017   Fatigue 12/01/2014   Morbid obesity with BMI of 40.0-44.9, adult ([place]) 05/11/2008    Allergies:  Allergies  Allergen Reactions   Sulfonamide Derivatives Rash   Medications:  Current Outpatient Medications:    CALCIUM PO, Take 1 tablet by mouth daily., Disp: , Rfl:    cholecalciferol (VITAMIN D3) 25 MCG (1000 UNIT) tablet, Take 2,000 Units by mouth daily., Disp: , Rfl:    escitalopram (LEXAPRO) 10 MG tablet, TAKE 1 TABLET (10 MG TOTAL) BY MOUTH DAILY., Disp:  90 tablet, Rfl: 1   Multiple Vitamins-Minerals (OPURITY PO), Take 1 tablet by mouth daily., Disp: , Rfl:    Norethindrone-Ethinyl Estradiol-Fe Biphas (LO LOESTRIN FE) 1 MG-10 MCG / 10 MCG tablet, TAKE 1 TABLET BY MOUTH ONCE DAILY, Disp: 84 tablet, Rfl: 4   Norethindrone-Ethinyl Estradiol-Fe Biphas (LO LOESTRIN FE) 1 MG-10 MCG / 10 MCG tablet, TAKE 1 TABLET BY MOUTH ONCE DAILY, Disp: 84 tablet, Rfl: 4   Semaglutide-Weight Management (WEGOVY) 0.25 MG/0.5ML SOAJ, Inject 0.25 mg into the skin once a week., Disp: 2 mL, Rfl: 0  Observations/Objective: Patient is well-developed, well-nourished in no acute distress.  Resting comfortably at home.  Head is normocephalic, atraumatic.  No labored breathing. Speech is clear and coherent with logical content.  Patient is alert and oriented at baseline.   Assessment and Plan: 1. COVID-19  Discussed risks/benefits of antiviral medications including most common potential ADRs. Patient voiced understanding and would like to proceed with antiviral medication to start only if symptoms began to worsen within 5 day period from onset They are candidate for Paxlovid and molnupiravir. Discussed each medication. Rx for Paxlovid sent to pharmacy. Supportive measures, OTC medications and vitamin regimen reviewed. Patient has been enrolled in a MyChart COVID symptom monitoring program. Samule Dry reviewed in detail. Strict ER precautions discussed with patient.     Follow Up Instructions: I discussed the assessment and treatment plan with the patient. The patient was provided an opportunity to ask questions and all were answered. The patient agreed with the plan and demonstrated an understanding of the instructions.  A copy of instructions were sent to the patient via MyChart unless otherwise noted below.   The patient was advised to call back or seek an in-person evaluation if the symptoms worsen or if the condition fails to improve as anticipated.  Time:  I spent 10  minutes with the patient via telehealth technology discussing the above problems/concerns.    Leeanne Rio, PA-C

## 2022-02-04 DIAGNOSIS — M25531 Pain in right wrist: Secondary | ICD-10-CM | POA: Diagnosis not present

## 2022-02-24 ENCOUNTER — Other Ambulatory Visit: Payer: Self-pay

## 2022-02-27 ENCOUNTER — Other Ambulatory Visit: Payer: Self-pay

## 2022-02-27 ENCOUNTER — Other Ambulatory Visit (HOSPITAL_COMMUNITY): Payer: Self-pay

## 2022-03-04 ENCOUNTER — Other Ambulatory Visit: Payer: Self-pay

## 2022-03-06 DIAGNOSIS — S7001XA Contusion of right hip, initial encounter: Secondary | ICD-10-CM | POA: Diagnosis not present

## 2022-03-06 DIAGNOSIS — M7061 Trochanteric bursitis, right hip: Secondary | ICD-10-CM | POA: Diagnosis not present

## 2022-03-06 DIAGNOSIS — M25551 Pain in right hip: Secondary | ICD-10-CM | POA: Diagnosis not present

## 2022-03-19 ENCOUNTER — Other Ambulatory Visit (HOSPITAL_COMMUNITY): Payer: Self-pay

## 2022-03-24 DIAGNOSIS — Z135 Encounter for screening for eye and ear disorders: Secondary | ICD-10-CM | POA: Diagnosis not present

## 2022-03-24 DIAGNOSIS — H5213 Myopia, bilateral: Secondary | ICD-10-CM | POA: Diagnosis not present

## 2022-03-24 DIAGNOSIS — H524 Presbyopia: Secondary | ICD-10-CM | POA: Diagnosis not present

## 2022-03-24 DIAGNOSIS — H52223 Regular astigmatism, bilateral: Secondary | ICD-10-CM | POA: Diagnosis not present

## 2022-03-25 ENCOUNTER — Telehealth: Payer: 59 | Admitting: Nurse Practitioner

## 2022-03-25 DIAGNOSIS — J0100 Acute maxillary sinusitis, unspecified: Secondary | ICD-10-CM

## 2022-03-26 ENCOUNTER — Other Ambulatory Visit: Payer: Self-pay

## 2022-03-26 ENCOUNTER — Encounter (INDEPENDENT_AMBULATORY_CARE_PROVIDER_SITE_OTHER): Payer: Self-pay | Admitting: Adult Health

## 2022-03-26 ENCOUNTER — Ambulatory Visit (INDEPENDENT_AMBULATORY_CARE_PROVIDER_SITE_OTHER): Payer: 59 | Admitting: Adult Health

## 2022-03-26 VITALS — BP 108/71 | HR 62 | Temp 98.2°F | Ht 66.0 in | Wt 194.0 lb

## 2022-03-26 DIAGNOSIS — E669 Obesity, unspecified: Secondary | ICD-10-CM | POA: Diagnosis not present

## 2022-03-26 DIAGNOSIS — F3289 Other specified depressive episodes: Secondary | ICD-10-CM | POA: Diagnosis not present

## 2022-03-26 DIAGNOSIS — Z6831 Body mass index (BMI) 31.0-31.9, adult: Secondary | ICD-10-CM | POA: Diagnosis not present

## 2022-03-26 DIAGNOSIS — E559 Vitamin D deficiency, unspecified: Secondary | ICD-10-CM

## 2022-03-26 MED ORDER — ESCITALOPRAM OXALATE 10 MG PO TABS
ORAL_TABLET | Freq: Every day | ORAL | 1 refills | Status: DC
  Filled 2022-03-26: qty 90, 90d supply, fill #0

## 2022-03-26 MED ORDER — AMOXICILLIN-POT CLAVULANATE 875-125 MG PO TABS: 1.0000 | ORAL_TABLET | Freq: Two times a day (BID) | ORAL | 0 refills | Status: DC

## 2022-03-26 MED ORDER — WEGOVY 0.25 MG/0.5ML ~~LOC~~ SOAJ
0.2500 mg | SUBCUTANEOUS | 0 refills | Status: DC
  Filled 2022-03-26: qty 2, fill #0

## 2022-03-26 NOTE — Progress Notes (Signed)

## 2022-03-31 ENCOUNTER — Other Ambulatory Visit: Payer: Self-pay

## 2022-04-02 NOTE — Progress Notes (Unsigned)
Chief Complaint:   OBESITY Wanda Peters is here to discuss her progress with her obesity treatment plan along with follow-up of her obesity related diagnoses. Wanda Peters is on practicing portion control and making smarter food choices, such as increasing vegetables and decreasing simple carbohydrates and states she is following her eating plan approximately 90% of the time. Wanda Peters states she is doing high intensity workout 3 hours per week.   Today's visit was #: 33 Starting weight: 281 lbs Starting date: 08/26/2017 Today's weight: 194 lbs Today's date: 03/26/2022 Total lbs lost to date: 87 lbs Total lbs lost since last in-office visit: 2 lbs  Interim History: Due to gastric sleeve surgery, she is unable to consume large portions. 04/2021-10/23, muscle mass +5 lbs, adipose mass -4.8 lbs.  Group fitness, Physiological scientist.  She has never been able to find [facility].  If able to find [facility], start and schedule follow up in 4 weeks after prescription initiation.   Subjective:   1. Vitamin D deficiency She is currently on OTC Vitamin D-3, 2,000 IU daily.  She endorses stable energy levels.    2. Other depression, with emotional eating  She endorses stable mood, she denies suicidal or homicidal ideations. She is currently on daily Lexapro 10 mg.    Assessment/Plan:   1. Vitamin D deficiency Check labs at next office visit.   2. Other depression, with emotional eating  Refill - escitalopram (LEXAPRO) 10 MG tablet; TAKE 1 TABLET (10 MG TOTAL) BY MOUTH DAILY.  Dispense: 90 tablet; Refill: 1  3. Obesity: current BMI 31.3 Refill - Semaglutide-Weight Management (WEGOVY) 0.25 MG/0.5ML SOAJ; Inject 0.25 mg into the skin once a week.  Dispense: 2 mL; Refill: 0  Wanda Peters is currently in the action stage of change. As such, her goal is to continue with weight loss efforts. She has agreed to practicing portion control and making smarter food choices, such as increasing vegetables and decreasing  simple carbohydrates.   Exercise goals:  As is.   Behavioral modification strategies: increasing lean protein intake, decreasing simple carbohydrates, meal planning and cooking strategies, keeping healthy foods in the home, and planning for success.  Wanda Peters has agreed to follow-up with our clinic in 16 weeks. She was informed of the importance of frequent follow-up visits to maximize her success with intensive lifestyle modifications for her multiple health conditions.   Objective:   Blood pressure 108/71, pulse 62, temperature 98.2 F (36.8 C), height '5\' 6"'$  (1.676 m), weight 194 lb (88 kg), SpO2 99 %. Body mass index is 31.31 kg/m.  General: Cooperative, alert, well developed, in no acute distress. HEENT: Conjunctivae and lids unremarkable. Cardiovascular: Regular rhythm.  Lungs: Normal work of breathing. Neurologic: No focal deficits.   Lab Results  Component Value Date   CREATININE 0.78 04/09/2021   BUN 12 04/09/2021   NA 141 04/09/2021   K 5.2 04/09/2021   CL 105 04/09/2021   CO2 24 04/09/2021   Lab Results  Component Value Date   ALT 8 04/09/2021   AST 14 04/09/2021   ALKPHOS 68 04/09/2021   BILITOT 0.4 04/09/2021   Lab Results  Component Value Date   HGBA1C 5.4 04/09/2021   HGBA1C 5.2 04/03/2020   HGBA1C 5.1 05/23/2019   HGBA1C 5.4 09/30/2018   HGBA1C 5.4 04/19/2018   Lab Results  Component Value Date   INSULIN 2.5 (L) 04/09/2021   INSULIN 4.1 04/03/2020   INSULIN 7.6 05/23/2019   INSULIN 7.9 04/19/2018   INSULIN 8.6 12/21/2017  Lab Results  Component Value Date   TSH 4.03 07/14/2017   Lab Results  Component Value Date   CHOL 163 04/09/2021   HDL 57 04/09/2021   LDLCALC 89 04/09/2021   TRIG 89 04/09/2021   CHOLHDL 3.4 09/30/2018   Lab Results  Component Value Date   VD25OH 59.3 04/09/2021   VD25OH 63.7 04/03/2020   VD25OH 36.0 05/23/2019   Lab Results  Component Value Date   WBC 6.5 04/09/2021   HGB 13.2 04/09/2021   HCT 38.2  04/09/2021   MCV 95 04/09/2021   PLT 238 04/09/2021   Lab Results  Component Value Date   IRON 109 04/03/2020   TIBC 268 04/03/2020   FERRITIN 124 07/02/2021    Attestation Statements:   Reviewed by clinician on day of visit: allergies, medications, problem list, medical history, surgical history, family history, social history, and previous encounter notes.  I, Davy Pique, RMA, am acting as Location manager for Mina Marble, NP.  I have reviewed the above documentation for accuracy and completeness, and I agree with the above. -  ***

## 2022-04-21 DIAGNOSIS — M25551 Pain in right hip: Secondary | ICD-10-CM | POA: Diagnosis not present

## 2022-04-21 DIAGNOSIS — M7061 Trochanteric bursitis, right hip: Secondary | ICD-10-CM | POA: Diagnosis not present

## 2022-05-25 ENCOUNTER — Telehealth: Payer: 59 | Admitting: Family

## 2022-05-25 DIAGNOSIS — J019 Acute sinusitis, unspecified: Secondary | ICD-10-CM

## 2022-05-25 MED ORDER — AMOXICILLIN-POT CLAVULANATE 875-125 MG PO TABS: 1.0000 | ORAL_TABLET | Freq: Two times a day (BID) | ORAL | 0 refills | Status: DC

## 2022-05-25 NOTE — Progress Notes (Signed)

## 2022-06-07 ENCOUNTER — Telehealth: Payer: 59 | Admitting: Physician Assistant

## 2022-06-07 DIAGNOSIS — B9689 Other specified bacterial agents as the cause of diseases classified elsewhere: Secondary | ICD-10-CM

## 2022-06-07 NOTE — Progress Notes (Signed)
Because were recently treated with an antibiotic through an evisit, I feel your condition warrants further evaluation and I recommend that you be seen in a face to face visit.   NOTE: There will be NO CHARGE for this eVisit   If you are having a true medical emergency please call 911.      For an urgent face to face visit, [facility] has seven urgent care centers for your convenience:     [facility] at  Get Driving Directions [phone] [address] [place], [place] [address]    [facility] [place] [facility]) Get Driving Directions [phone] [place], [place] [address]  [facility] [place] ([place]) Get Driving Directions [phone] [address] [place],  [place]  [address]  [facility] - at [address] Get Driving Directions  [phone] [address] [place],  [place] [address]   [facility] Get Driving Directions [phone] [address], [place], [place] [address]   [facility] Get Driving Directions  [phone] [address], [place] [address]   [facility] [place] Get Driving Directions [phone] [address], [place], [place] [address]  Your MyChart E-visit questionnaire answers were reviewed by a board certified advanced clinical practitioner to complete your personal care plan based on your specific symptoms.  Thank you for using e-Visits.

## 2022-06-08 ENCOUNTER — Telehealth: Payer: 59 | Admitting: Urgent Care

## 2022-06-08 ENCOUNTER — Telehealth: Payer: 59

## 2022-06-08 DIAGNOSIS — J0141 Acute recurrent pansinusitis: Secondary | ICD-10-CM

## 2022-06-08 MED ORDER — AMOXICILLIN-POT CLAVULANATE 875-125 MG PO TABS: 1.0000 | ORAL_TABLET | Freq: Two times a day (BID) | ORAL | 0 refills | Status: AC

## 2022-06-08 MED ORDER — PREDNISONE 50 MG PO TABS: 50.0000 mg | ORAL_TABLET | Freq: Every day | ORAL | 0 refills | Status: DC

## 2022-06-08 NOTE — Progress Notes (Signed)
Virtual Visit Consent   [name], you are scheduled for a virtual visit with a [facility] provider today. Just as with appointments in the office, your consent must be obtained to participate. Your consent will be active for this visit and any virtual visit you may have with one of our providers in the next 365 days. If you have a MyChart account, a copy of this consent can be sent to you electronically.  As this is a virtual visit, video technology does not allow for your provider to perform a traditional examination. This may limit your provider's ability to fully assess your condition. If your provider identifies any concerns that need to be evaluated in person or the need to arrange testing (such as labs, EKG, etc.), we will make arrangements to do so. Although advances in technology are sophisticated, we cannot ensure that it will always work on either your end or our end. If the connection with a video visit is poor, the visit may have to be switched to a telephone visit. With either a video or telephone visit, we are not always able to ensure that we have a secure connection.  By engaging in this virtual visit, you consent to the provision of healthcare and authorize for your insurance to be billed (if applicable) for the services provided during this visit. Depending on your insurance coverage, you may receive a charge related to this service.  I need to obtain your verbal consent now. Are you willing to proceed with your visit today? LAYLANIE KRUCZEK has provided verbal consent on 06/08/2022 for a virtual visit (video or telephone). Chaney Malling, PA  Date: 06/08/2022 8:38 AM  Virtual Visit via Video Note   I, [place], connected with  Wanda Peters  ([id number], [birth year]) on 06/08/22 at  8:30 AM EST by a video-enabled telemedicine application and verified that I am speaking with the correct person using two identifiers.  Location: Patient: Virtual  Visit Location Patient: Home Provider: Virtual Visit Location Provider: Home Office   I discussed the limitations of evaluation and management by telemedicine and the availability of in person appointments. The patient expressed understanding and agreed to proceed.    History of Present Illness: Wanda Peters is a 46 y.o. who identifies as a female who was assigned female at birth, and is being seen today for recurrent sinusitis.  HPI: Pleasant 46yo female with known hx of allergies and recurrent sinusitis presents today due to concerns of recurrence. Had a 7 day course of Augmentin called in via evisit on 05/25/22. States sx resolved completely, but on 06/03/22 sx returned. Sx feel consistent with all prior sinus infections. Denies any cough or chest congestion. No known fever. Continues her allegra and flonase without sx relief. 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   Class 1 obesity with serious comorbidity and body mass index (BMI) of 33.0 to 33.9 in adult 09/20/2019   Peripheral edema 11/03/2018   Insulin resistance 09/06/2018   Other hyperlipidemia 09/06/2018   Depression 08/12/2018   Class 3 severe obesity with body mass index (BMI) of 40.0 to 44.9 in adult [facility]) 08/12/2018   Shortness of breath on exertion 08/26/2017  Vitamin D deficiency 08/26/2017   Acute bursitis of left shoulder 06/30/2017   Fatigue 12/01/2014   Morbid obesity with BMI of 40.0-44.9, adult ([place]) 05/11/2008    Allergies:  Allergies  Allergen Reactions   Sulfonamide Derivatives Rash   Medications:  Current Outpatient Medications:    amoxicillin-clavulanate (AUGMENTIN) 875-125 MG  tablet, Take 1 tablet by mouth 2 (two) times daily for 7 days., Disp: 14 tablet, Rfl: 0   predniSONE (DELTASONE) 50 MG tablet, Take 1 tablet (50 mg total) by mouth daily with breakfast., Disp: 5 tablet, Rfl: 0   CALCIUM PO, Take 1 tablet by mouth daily., Disp: , Rfl:    cholecalciferol (VITAMIN D3) 25 MCG (1000 UNIT) tablet, Take 2,000 Units by mouth daily., Disp: , Rfl:    escitalopram (LEXAPRO) 10 MG tablet, TAKE 1 TABLET (10 MG TOTAL) BY MOUTH DAILY., Disp: 90 tablet, Rfl: 1   Multiple Vitamins-Minerals (OPURITY PO), Take 1 tablet by mouth daily., Disp: , Rfl:    Norethindrone-Ethinyl Estradiol-Fe Biphas (LO LOESTRIN FE) 1 MG-10 MCG / 10 MCG tablet, TAKE 1 TABLET BY MOUTH ONCE DAILY, Disp: 84 tablet, Rfl: 4   Semaglutide-Weight Management (WEGOVY) 0.25 MG/0.5ML SOAJ, Inject 0.25 mg into the skin once a week., Disp: 2 mL, Rfl: 0  Observations/Objective: Patient is well-developed, well-nourished in no acute distress.  Resting comfortably at home.  Head is normocephalic, atraumatic.  No labored breathing. No coughing Speech is clear and coherent with logical content.  Patient is alert and oriented at baseline.  Nasal voice quality, sinus congestion  Assessment and Plan: 1. Acute recurrent pansinusitis  Will re-start an additional 7 days which would total a complete 14 day tx for sinusitis. Pt to continue her antihistamines, flonase, saline washes. Will also do 5 days of pred to cover for possible allergic/ viral causes.  Follow Up Instructions: I discussed the assessment and treatment plan with the patient. The patient was provided an opportunity to ask questions and all were answered. The patient agreed with the plan and demonstrated an understanding of the instructions.  A copy of instructions were sent to the patient via MyChart unless otherwise noted below.    The patient was advised to call back or seek an in-person evaluation if the symptoms worsen or if the condition fails to  improve as anticipated.  Time:  I spent 7 minutes with the patient via telehealth technology discussing the above problems/concerns.    [place], PA

## 2022-06-08 NOTE — Patient Instructions (Signed)
Wanda Peters, thank you for joining Chaney Malling, PA for today's virtual visit.  While this provider is not your primary care provider (PCP), if your PCP is located in our provider database this encounter information will be shared with them immediately following your visit.   [facility] account gives you access to today's visit and all your visits, tests, and labs performed at [facility] " click here if you don't have a [facility] account or go to [email or web address]  Consent: (Patient) Wanda Peters provided verbal consent for this virtual visit at the beginning of the encounter.  Current Medications:  Current Outpatient Medications:    amoxicillin-clavulanate (AUGMENTIN) 875-125 MG tablet, Take 1 tablet by mouth 2 (two) times daily for 7 days., Disp: 14 tablet, Rfl: 0   predniSONE (DELTASONE) 50 MG tablet, Take 1 tablet (50 mg total) by mouth daily with breakfast., Disp: 5 tablet, Rfl: 0   CALCIUM PO, Take 1 tablet by mouth daily., Disp: , Rfl:    cholecalciferol (VITAMIN D3) 25 MCG (1000 UNIT) tablet, Take 2,000 Units by mouth daily., Disp: , Rfl:    escitalopram (LEXAPRO) 10 MG tablet, TAKE 1 TABLET (10 MG TOTAL) BY MOUTH DAILY., Disp: 90 tablet, Rfl: 1   Multiple Vitamins-Minerals (OPURITY PO), Take 1 tablet by mouth daily., Disp: , Rfl:    Norethindrone-Ethinyl Estradiol-Fe Biphas (LO LOESTRIN FE) 1 MG-10 MCG / 10 MCG tablet, TAKE 1 TABLET BY MOUTH ONCE DAILY, Disp: 84 tablet, Rfl: 4   Semaglutide-Weight Management (WEGOVY) 0.25 MG/0.5ML SOAJ, Inject 0.25 mg into the skin once a week., Disp: 2 mL, Rfl: 0   Medications ordered in this encounter:  Meds ordered this encounter  Medications   amoxicillin-clavulanate (AUGMENTIN) 875-125 MG tablet    Sig: Take 1 tablet by mouth 2 (two) times daily for 7 days.    Dispense:  14 tablet    Refill:  0    Order Specific Question:   Supervising Provider    Answer:   Chase Picket  [[id number]]   predniSONE (DELTASONE) 50 MG tablet    Sig: Take 1 tablet (50 mg total) by mouth daily with breakfast.    Dispense:  5 tablet    Refill:  0    Order Specific Question:   Supervising Provider    Answer:   Chase Picket [[id number]]     *If you need refills on other medications prior to your next appointment, please contact your pharmacy*  Follow-Up: Call back or seek an in-person evaluation if the symptoms worsen or if the condition fails to improve as anticipated.  [facility] [phone]  Other Instructions You have a sinus infection. Causes can include bacteria, allergies and viruses.  Please start taking the antibiotic, Augmentin, twice daily with food. Take it for all 7 days, do not stop early just because you feel better. Take an over the counter probiotic or yogurt daily to help prevent diarrhea/ yeast infection.  Continue your antihistamine once daily to help clear up the mucous. Continue Flonase daily to help with inflammation of the nasal passage.  I have called in prednisone for you to take. Take one tablet daily with breakfast.  It is also recommended that you use nasal saline/ sinus washes to cleans the sinus passages. Hot steam from a shower or vaporizer may also be beneficial to help open up the upper airway. Eucalyptus can be helpful.  If any worsening symptoms such as headache, fever, or  shortness of breath, please go to an in person evaluation/ urgent care.    If you have been instructed to have an in-person evaluation today at a local Urgent Care facility, please use the link below. It will take you to a list of all of our available [facility], including address, phone number and hours of operation. Please do not delay care.  [facility]  If you or a family member do not have a primary care provider, use the link below to schedule a visit and establish care. When you choose a [facility] primary care  physician or advanced practice provider, you gain a long-term partner in health. Find a Primary Care Provider  Learn more about [facility] in-office and virtual care options: [facility]

## 2022-06-16 ENCOUNTER — Other Ambulatory Visit: Payer: Self-pay

## 2022-07-14 ENCOUNTER — Encounter (INDEPENDENT_AMBULATORY_CARE_PROVIDER_SITE_OTHER): Payer: Self-pay | Admitting: Adult Health

## 2022-07-15 ENCOUNTER — Encounter (INDEPENDENT_AMBULATORY_CARE_PROVIDER_SITE_OTHER): Payer: Self-pay | Admitting: Adult Health

## 2022-07-15 ENCOUNTER — Other Ambulatory Visit (INDEPENDENT_AMBULATORY_CARE_PROVIDER_SITE_OTHER): Payer: Self-pay | Admitting: Adult Health

## 2022-07-15 ENCOUNTER — Other Ambulatory Visit (HOSPITAL_COMMUNITY): Payer: Self-pay

## 2022-07-15 DIAGNOSIS — F3289 Other specified depressive episodes: Secondary | ICD-10-CM

## 2022-07-15 MED ORDER — ESCITALOPRAM OXALATE 10 MG PO TABS
10.0000 mg | ORAL_TABLET | Freq: Every day | ORAL | 0 refills | Status: DC
  Filled 2022-07-15 – 2022-07-23 (×2): qty 90, 90d supply, fill #0

## 2022-07-16 ENCOUNTER — Ambulatory Visit (INDEPENDENT_AMBULATORY_CARE_PROVIDER_SITE_OTHER): Payer: 59 | Admitting: Adult Health

## 2022-07-23 ENCOUNTER — Other Ambulatory Visit (HOSPITAL_COMMUNITY): Payer: Self-pay

## 2022-07-23 ENCOUNTER — Other Ambulatory Visit: Payer: Self-pay

## 2022-07-29 ENCOUNTER — Telehealth: Payer: Commercial Managed Care - PPO | Admitting: Family Medicine

## 2022-07-29 DIAGNOSIS — B9689 Other specified bacterial agents as the cause of diseases classified elsewhere: Secondary | ICD-10-CM | POA: Diagnosis not present

## 2022-07-29 DIAGNOSIS — J019 Acute sinusitis, unspecified: Secondary | ICD-10-CM

## 2022-07-29 MED ORDER — DOXYCYCLINE HYCLATE 100 MG PO TABS: 100.0000 mg | ORAL_TABLET | Freq: Two times a day (BID) | ORAL | 0 refills | Status: AC

## 2022-07-29 NOTE — Progress Notes (Signed)

## 2022-08-11 ENCOUNTER — Other Ambulatory Visit (HOSPITAL_BASED_OUTPATIENT_CLINIC_OR_DEPARTMENT_OTHER): Payer: Self-pay

## 2022-08-18 ENCOUNTER — Ambulatory Visit (INDEPENDENT_AMBULATORY_CARE_PROVIDER_SITE_OTHER): Payer: Commercial Managed Care - PPO | Admitting: Adult Health

## 2022-08-18 ENCOUNTER — Other Ambulatory Visit: Payer: Self-pay

## 2022-08-18 ENCOUNTER — Encounter (INDEPENDENT_AMBULATORY_CARE_PROVIDER_SITE_OTHER): Payer: Self-pay | Admitting: Adult Health

## 2022-08-18 VITALS — BP 104/69 | HR 60 | Temp 98.2°F | Ht 66.0 in | Wt 190.0 lb

## 2022-08-18 DIAGNOSIS — E88819 Insulin resistance, unspecified: Secondary | ICD-10-CM | POA: Diagnosis not present

## 2022-08-18 DIAGNOSIS — Z6830 Body mass index (BMI) 30.0-30.9, adult: Secondary | ICD-10-CM | POA: Diagnosis not present

## 2022-08-18 DIAGNOSIS — E559 Vitamin D deficiency, unspecified: Secondary | ICD-10-CM

## 2022-08-18 DIAGNOSIS — Z9884 Bariatric surgery status: Secondary | ICD-10-CM

## 2022-08-18 DIAGNOSIS — E669 Obesity, unspecified: Secondary | ICD-10-CM

## 2022-08-18 DIAGNOSIS — E7800 Pure hypercholesterolemia, unspecified: Secondary | ICD-10-CM | POA: Diagnosis not present

## 2022-08-18 DIAGNOSIS — F3289 Other specified depressive episodes: Secondary | ICD-10-CM

## 2022-08-18 DIAGNOSIS — Z6841 Body Mass Index (BMI) 40.0 and over, adult: Secondary | ICD-10-CM

## 2022-08-18 MED ORDER — ESCITALOPRAM OXALATE 10 MG PO TABS
10.0000 mg | ORAL_TABLET | Freq: Every day | ORAL | 0 refills | Status: DC
  Filled 2022-08-18 – 2022-10-29 (×2): qty 90, 90d supply, fill #0

## 2022-08-18 NOTE — Progress Notes (Signed)
WEIGHT SUMMARY AND BIOMETRICS  Vitals Temp: 98.2 F (36.8 C) BP: 104/69 Pulse Rate: 60 SpO2: 100 %   Anthropometric Measurements Height: '5\' 6"'$  (1.676 m) Weight: 190 lb (86.2 kg) BMI (Calculated): 30.68 Weight at Last Visit: 194lb Weight Lost Since Last Visit: 4lb Starting Weight: 281lb Total Weight Loss (lbs): 91 lb (41.3 kg)   Body Composition  Body Fat %: 41.1 % Fat Mass (lbs): 78.4 lbs Muscle Mass (lbs): 106.8 lbs Total Body Water (lbs): 79.8 lbs Visceral Fat Rating : 9   Other Clinical Data Fasting: Yes Labs: no Today's Visit #: 39 Starting Date: 08/26/17    Chief Complaint:   OBESITY Wanda Peters is here to discuss her progress with her obesity treatment plan. She is on the practicing portion control and making smarter food choices, such as increasing vegetables and decreasing simple carbohydrates and states she is following her eating plan approximately 100 % of the time.  She states she is exercising HITT class 60 minutes 3 times per week.   Interim History:  Wanda Peters is currently on Mx Phase. Started Program 11/2017- Adipose Mass 133 lbs         Visceral Adipose Rating 14  Current- Adipose Mass 78 lbs Visceral Adipose Rating 9  She attends small HITT class 3 times a week, sessions are 60 mins each- GREAT!  Subjective:   1. Insulin resistance  Latest Reference Range & Units 04/09/21 09:56  INSULIN 2.6 - 24.9 uIU/mL 2.5 (L)  (L): Data is abnormally low  2. Vitamin D deficiency 04/09/2021 Vit D Level- 59.3- stable She is on OTC Vit D 1000 IU BID and daily Bariatric multivitamin.  3. Elevated LDL cholesterol level Lipid Panel     Component Value Date/Time   CHOL 163 04/09/2021 0956   TRIG 89 04/09/2021 0956   HDL 57 04/09/2021 0956   CHOLHDL 3.4 09/30/2018 0852   VLDL 11 09/30/2018 0852   LDLCALC 89 04/09/2021 0956   LABVLDL 17 04/09/2021 0956   She estimates to consume red meat 2-3 times per week. She denies tobacco/vape  use.  4. Status post laparoscopic sleeve gastrectomy/Intestinal malabsorption, unspecified type May 2022- Sleeve Gastrectomy  Latest Reference Range & Units 04/09/21 09:56  Thiamine 66.5 - 200.0 nmol/L 144.4  Vitamin D, 25-Hydroxy 30.0 - 100.0 ng/mL 59.3  Vitamin B12 232 - 1,245 pg/mL 536  She is on daily Bariatric multivitamin.  5. Other depression, with emotional eating  She reports stable mood, denies SI/HI She is currently on daily Lexapro '10mg'$    Assessment/Plan:   1. Insulin resistance Check Labs - Hemoglobin A1c - Insulin, random  2. Vitamin D deficiency Check Labs - VITAMIN D 25 Hydroxy (Vit-D Deficiency, Fractures)  3. Elevated LDL cholesterol level Check Labs - Comprehensive metabolic panel - Lipid panel  4. Status post laparoscopic sleeve gastrectomy/Intestinal malabsorption, unspecified type Check Labs - Vitamin B1 - Vitamin B12  5. Other depression, with emotional eating  Refill - escitalopram (LEXAPRO) 10 MG tablet; Take 1 tablet (10 mg total) by mouth daily  Dispense: 90 tablet; Refill: 0  6. Obesity: current BMI 30.68  Wanda Peters is currently in the action stage of change. As such, her goal is to maintain weight for now. She has agreed to practicing portion control and making smarter food choices, such as increasing vegetables and decreasing simple carbohydrates.   Exercise goals: For substantial health benefits, adults should do at least 150 minutes (2 hours and 30 minutes) a week of moderate-intensity, or 75  minutes (1 hour and 15 minutes) a week of vigorous-intensity aerobic physical activity, or an equivalent combination of moderate- and vigorous-intensity aerobic activity. Aerobic activity should be performed in episodes of at least 10 minutes, and preferably, it should be spread throughout the week.  Behavioral modification strategies: increasing lean protein intake, decreasing simple carbohydrates, increasing vegetables, meal planning and cooking  strategies, and planning for success.  Wanda Peters has agreed to follow-up with our clinic in 16 weeks. She was informed of the importance of frequent follow-up visits to maximize her success with intensive lifestyle modifications for her multiple health conditions.   Wanda Peters was informed we would discuss her lab results at her next visit unless there is a critical issue that needs to be addressed sooner. Wanda Peters agreed to keep her next visit at the agreed upon time to discuss these results.  Objective:   Blood pressure 104/69, pulse 60, temperature 98.2 F (36.8 C), height '5\' 6"'$  (1.676 m), weight 190 lb (86.2 kg), SpO2 100 %. Body mass index is 30.67 kg/m.  General: Cooperative, alert, well developed, in no acute distress. HEENT: Conjunctivae and lids unremarkable. Cardiovascular: Regular rhythm.  Lungs: Normal work of breathing. Neurologic: No focal deficits.   Lab Results  Component Value Date   CREATININE 0.78 04/09/2021   BUN 12 04/09/2021   NA 141 04/09/2021   K 5.2 04/09/2021   CL 105 04/09/2021   CO2 24 04/09/2021   Lab Results  Component Value Date   ALT 8 04/09/2021   AST 14 04/09/2021   ALKPHOS 68 04/09/2021   BILITOT 0.4 04/09/2021   Lab Results  Component Value Date   HGBA1C 5.4 04/09/2021   HGBA1C 5.2 04/03/2020   HGBA1C 5.1 05/23/2019   HGBA1C 5.4 09/30/2018   HGBA1C 5.4 04/19/2018   Lab Results  Component Value Date   INSULIN 2.5 (L) 04/09/2021   INSULIN 4.1 04/03/2020   INSULIN 7.6 05/23/2019   INSULIN 7.9 04/19/2018   INSULIN 8.6 12/21/2017   Lab Results  Component Value Date   TSH 4.03 07/14/2017   Lab Results  Component Value Date   CHOL 163 04/09/2021   HDL 57 04/09/2021   LDLCALC 89 04/09/2021   TRIG 89 04/09/2021   CHOLHDL 3.4 09/30/2018   Lab Results  Component Value Date   VD25OH 59.3 04/09/2021   VD25OH 63.7 04/03/2020   VD25OH 36.0 05/23/2019   Lab Results  Component Value Date   WBC 6.5 04/09/2021   HGB 13.2  04/09/2021   HCT 38.2 04/09/2021   MCV 95 04/09/2021   PLT 238 04/09/2021   Lab Results  Component Value Date   IRON 109 04/03/2020   TIBC 268 04/03/2020   FERRITIN 124 07/02/2021   Attestation Statements:   Reviewed by clinician on day of visit: allergies, medications, problem list, medical history, surgical history, family history, social history, and previous encounter notes.  I have reviewed the above documentation for accuracy and completeness, and I agree with the above. -  Vessie Olmsted d. Shaquill Iseman, NP-C

## 2022-08-21 LAB — VITAMIN B12: Vitamin B-12: 411 pg/mL (ref 232–1245)

## 2022-08-21 LAB — COMPREHENSIVE METABOLIC PANEL
ALT: 8 IU/L (ref 0–32)
AST: 18 IU/L (ref 0–40)
Albumin/Globulin Ratio: 1.7 (ref 1.2–2.2)
Albumin: 4.2 g/dL (ref 3.9–4.9)
Alkaline Phosphatase: 63 IU/L (ref 44–121)
BUN/Creatinine Ratio: 14 (ref 9–23)
BUN: 12 mg/dL (ref 6–24)
Bilirubin Total: 0.3 mg/dL (ref 0.0–1.2)
CO2: 22 mmol/L (ref 20–29)
Calcium: 9.1 mg/dL (ref 8.7–10.2)
Chloride: 104 mmol/L (ref 96–106)
Creatinine, Ser: 0.85 mg/dL (ref 0.57–1.00)
Globulin, Total: 2.5 g/dL (ref 1.5–4.5)
Glucose: 75 mg/dL (ref 70–99)
Potassium: 4.3 mmol/L (ref 3.5–5.2)
Sodium: 140 mmol/L (ref 134–144)
Total Protein: 6.7 g/dL (ref 6.0–8.5)
eGFR: 84 mL/min/{1.73_m2} (ref >59)

## 2022-08-21 LAB — LIPID PANEL
Chol/HDL Ratio: 2.6 ratio (ref 0.0–4.4)
Cholesterol, Total: 169 mg/dL (ref 100–199)
HDL: 65 mg/dL (ref >39)
LDL Chol Calc (NIH): 87 mg/dL (ref 0–99)
Triglycerides: 96 mg/dL (ref 0–149)
VLDL Cholesterol Cal: 17 mg/dL (ref 5–40)

## 2022-08-21 LAB — VITAMIN B1: Thiamine: 112.3 nmol/L (ref 66.5–200.0)

## 2022-08-21 LAB — INSULIN, RANDOM: INSULIN: 3.4 u[IU]/mL (ref 2.6–24.9)

## 2022-08-21 LAB — VITAMIN D 25 HYDROXY (VIT D DEFICIENCY, FRACTURES): Vit D, 25-Hydroxy: 57.2 ng/mL (ref 30.0–100.0)

## 2022-08-21 LAB — HEMOGLOBIN A1C
Est. average glucose Bld gHb Est-mCnc: 108 mg/dL
Hgb A1c MFr Bld: 5.4 % (ref 4.8–5.6)

## 2022-09-08 ENCOUNTER — Other Ambulatory Visit: Payer: Self-pay

## 2022-10-16 DIAGNOSIS — H16201 Unspecified keratoconjunctivitis, right eye: Secondary | ICD-10-CM | POA: Diagnosis not present

## 2022-10-30 ENCOUNTER — Other Ambulatory Visit: Payer: Self-pay

## 2022-10-31 ENCOUNTER — Other Ambulatory Visit: Payer: Self-pay

## 2022-11-07 ENCOUNTER — Telehealth: Payer: Commercial Managed Care - PPO | Admitting: Family Medicine

## 2022-11-07 DIAGNOSIS — J0141 Acute recurrent pansinusitis: Secondary | ICD-10-CM

## 2022-11-07 NOTE — Progress Notes (Signed)
Good morning,   Looking back at your chart- you have had a lot of trouble with sinus infections. We last treated you in Feb and in Dec and several others in the last year.   We need to have you seen in person by your PCP and or at an Urgent Care. I would also recommend asking if your PCP would be ok to start allergy medication and nasal spray daily for you. And if those do not help- an ENT referral.   The use of antibiotics and or prednisone often places you are risk for other complications.    You will not be charged for this EV.    We hope you feel better soon. Dahlia Client    I feel your condition warrants further evaluation and I recommend that you be seen for a face to face visit.  Please contact your primary care physician practice to be seen. You can also go to a local Urgent Care.   NOTE: You will NOT be charged for this eVisit.

## 2022-11-10 ENCOUNTER — Other Ambulatory Visit: Payer: Self-pay

## 2022-11-10 ENCOUNTER — Telehealth: Payer: Commercial Managed Care - PPO | Admitting: Emergency Medicine

## 2022-11-10 DIAGNOSIS — J019 Acute sinusitis, unspecified: Secondary | ICD-10-CM

## 2022-11-10 MED ORDER — AMOXICILLIN-POT CLAVULANATE 875-125 MG PO TABS
1.0000 | ORAL_TABLET | Freq: Two times a day (BID) | ORAL | 0 refills | Status: AC
  Filled 2022-11-10: qty 20, 10d supply, fill #0

## 2022-11-10 NOTE — Patient Instructions (Signed)
  Wanda Peters, thank you for joining Wanda Parsons, NP for today's virtual visit.  While this provider is not your primary care provider (PCP), if your PCP is located in our provider database this encounter information will be shared with them immediately following your visit.   A [facility] MyChart account gives you access to today's visit and all your visits, tests, and labs performed at [facility] " click here if you don't have a [facility] MyChart account or go to [email or web address]  Consent: (Patient) Wanda Peters provided verbal consent for this virtual visit at the beginning of the encounter.  Current Medications:  Current Outpatient Medications:    amoxicillin-clavulanate (AUGMENTIN) 875-125 MG tablet, Take 1 tablet by mouth 2 (two) times daily for 10 days., Disp: 20 tablet, Rfl: 0   CALCIUM PO, Take 1 tablet by mouth daily., Disp: , Rfl:    cholecalciferol (VITAMIN D3) 25 MCG (1000 UNIT) tablet, Take 2,000 Units by mouth daily., Disp: , Rfl:    escitalopram (LEXAPRO) 10 MG tablet, Take 1 tablet (10 mg total) by mouth daily, Disp: 90 tablet, Rfl: 0   Multiple Vitamins-Minerals (OPURITY PO), Take 1 tablet by mouth daily., Disp: , Rfl:    Norethindrone-Ethinyl Estradiol-Fe Biphas (LO LOESTRIN FE) 1 MG-10 MCG / 10 MCG tablet, TAKE 1 TABLET BY MOUTH ONCE DAILY, Disp: 84 tablet, Rfl: 4   Medications ordered in this encounter:  Meds ordered this encounter  Medications   amoxicillin-clavulanate (AUGMENTIN) 875-125 MG tablet    Sig: Take 1 tablet by mouth 2 (two) times daily for 10 days.    Dispense:  20 tablet    Refill:  0     *If you need refills on other medications prior to your next appointment, please contact your pharmacy*  Follow-Up: Call back or seek an in-person evaluation if the symptoms worsen or if the condition fails to improve as anticipated.  [facility] [phone]  Other Instructions  Keep using your  allergy medicine, Flonase, Mucinex, and motrin.   Try using saline irrigation, such as with a neti pot, several times a day while you are sick. Many neti pots come with salt packets premeasured to use to make saline. If you use your own salt, make sure it is kosher salt or sea salt (don't use table salt as it has iodine in it and you don't need that in your nose). Use distilled water to make saline. If you mix your own saline using your own salt, the recipe is 1/4 teaspoon salt in 1 cup warm water. Using saline irrigation can help prevent and treat sinus infections.    If you have been instructed to have an in-person evaluation today at a local Urgent Care facility, please use the link below. It will take you to a list of all of our available [facility], including address, phone number and hours of operation. Please do not delay care.  [facility]  If you or a family member do not have a primary care provider, use the link below to schedule a visit and establish care. When you choose a [facility] primary care physician or advanced practice provider, you gain a long-term partner in health. Find a Primary Care Provider  Learn more about [facility]'s in-office and virtual care options:  - Get Care Now

## 2022-11-10 NOTE — Progress Notes (Signed)
Virtual Visit Consent   Mannie Stabile, you are scheduled for a virtual visit with a [facility] provider today. Just as with appointments in the office, your consent must be obtained to participate. Your consent will be active for this visit and any virtual visit you may have with one of our providers in the next 365 days. If you have a MyChart account, a copy of this consent can be sent to you electronically.  As this is a virtual visit, video technology does not allow for your provider to perform a traditional examination. This may limit your provider's ability to fully assess your condition. If your provider identifies any concerns that need to be evaluated in person or the need to arrange testing (such as labs, EKG, etc.), we will make arrangements to do so. Although advances in technology are sophisticated, we cannot ensure that it will always work on either your end or our end. If the connection with a video visit is poor, the visit may have to be switched to a telephone visit. With either a video or telephone visit, we are not always able to ensure that we have a secure connection.  By engaging in this virtual visit, you consent to the provision of healthcare and authorize for your insurance to be billed (if applicable) for the services provided during this visit. Depending on your insurance coverage, you may receive a charge related to this service.  I need to obtain your verbal consent now. Are you willing to proceed with your visit today? Wanda Peters has provided verbal consent on 11/10/2022 for a virtual visit (video or telephone). Cathlyn Parsons, NP  Date: 11/10/2022 8:40 AM  Virtual Visit via Video Note   I, Cathlyn Parsons, connected with  Wanda Peters  ([id number], 18-Jan-1976) on 11/10/22 at  8:30 AM EDT by a video-enabled telemedicine application and verified that I am speaking with the correct person using two identifiers.  Location: Patient: Virtual Visit  Location Patient: Other: work Provider: Pharmacist, community: Home Office   I discussed the limitations of evaluation and management by telemedicine and the availability of in person appointments. The patient expressed understanding and agreed to proceed.    History of Present Illness: Wanda Peters is a 47 y.o. who identifies as a female who was assigned female at birth, and is being seen today for recurrent sinus infection. This is 4th one this year. Generally gets 1-2 per year, this year has been bad. Did see ENT around 7-8 years ago for similar problem and no significant problems were found. Was instructed to use Allegra and Flonase fro seaasonal allergies which she has done. Has been using them since February. Also was instructed to add mucinex and motrin during times of probable sinus infection which she added 2 weeks ago when she developed sinus congestion, headaches, sinus pressure. For last week, little drainage from nose but what is coming out is yellow-green. Has used saline spray sometimes but has never used a neti pot.   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   Class 1 obesity with serious comorbidity and body mass index (BMI) of 33.0 to 33.9 in adult 09/20/2019   Peripheral edema 11/03/2018   Insulin resistance 09/06/2018  Other hyperlipidemia 09/06/2018   Depression 08/12/2018   Class 3 severe obesity with body mass index (BMI) of 40.0 to 44.9 in adult [facility]) 08/12/2018   Shortness of breath on exertion 08/26/2017   Vitamin D deficiency 08/26/2017   Acute bursitis of left shoulder 06/30/2017   Fatigue 12/01/2014   Morbid obesity with BMI of 40.0-44.9, adult (HCC) 05/11/2008    Allergies:  Allergies   Allergen Reactions   Sulfonamide Derivatives Rash   Medications:  Current Outpatient Medications:    amoxicillin-clavulanate (AUGMENTIN) 875-125 MG tablet, Take 1 tablet by mouth 2 (two) times daily for 10 days., Disp: 20 tablet, Rfl: 0   CALCIUM PO, Take 1 tablet by mouth daily., Disp: , Rfl:    cholecalciferol (VITAMIN D3) 25 MCG (1000 UNIT) tablet, Take 2,000 Units by mouth daily., Disp: , Rfl:    escitalopram (LEXAPRO) 10 MG tablet, Take 1 tablet (10 mg total) by mouth daily, Disp: 90 tablet, Rfl: 0   Multiple Vitamins-Minerals (OPURITY PO), Take 1 tablet by mouth daily., Disp: , Rfl:    Norethindrone-Ethinyl Estradiol-Fe Biphas (LO LOESTRIN FE) 1 MG-10 MCG / 10 MCG tablet, TAKE 1 TABLET BY MOUTH ONCE DAILY, Disp: 84 tablet, Rfl: 4  Observations/Objective: Patient is well-developed, well-nourished in no acute distress.  Resting comfortably  at work.  Head is normocephalic, atraumatic.  No labored breathing. Does sound congested on video Speech is clear and coherent with logical content.  Patient is alert and oriented at baseline.    Assessment and Plan: 1. Acute sinusitis, recurrence not specified, unspecified location  Discussed supportive care. Rx augmentin. Rec neti pot use.   Follow Up Instructions: I discussed the assessment and treatment plan with the patient. The patient was provided an opportunity to ask questions and all were answered. The patient agreed with the plan and demonstrated an understanding of the instructions.  A copy of instructions were sent to the patient via MyChart unless otherwise noted below.   The patient was advised to call back or seek an in-person evaluation if the symptoms worsen or if the condition fails to improve as anticipated.  Time:  I spent 15 minutes with the patient via telehealth technology discussing the above problems/concerns.    Cathlyn Parsons, NP

## 2022-12-16 ENCOUNTER — Encounter (INDEPENDENT_AMBULATORY_CARE_PROVIDER_SITE_OTHER): Payer: Self-pay | Admitting: Adult Health

## 2022-12-16 ENCOUNTER — Other Ambulatory Visit: Payer: Self-pay

## 2022-12-16 ENCOUNTER — Ambulatory Visit (INDEPENDENT_AMBULATORY_CARE_PROVIDER_SITE_OTHER): Payer: Commercial Managed Care - PPO | Admitting: Adult Health

## 2022-12-16 VITALS — BP 99/65 | HR 59 | Temp 98.0°F | Ht 66.0 in | Wt 192.0 lb

## 2022-12-16 DIAGNOSIS — F3289 Other specified depressive episodes: Secondary | ICD-10-CM | POA: Diagnosis not present

## 2022-12-16 DIAGNOSIS — Z6830 Body mass index (BMI) 30.0-30.9, adult: Secondary | ICD-10-CM

## 2022-12-16 DIAGNOSIS — E559 Vitamin D deficiency, unspecified: Secondary | ICD-10-CM | POA: Diagnosis not present

## 2022-12-16 DIAGNOSIS — Z9884 Bariatric surgery status: Secondary | ICD-10-CM

## 2022-12-16 DIAGNOSIS — E7800 Pure hypercholesterolemia, unspecified: Secondary | ICD-10-CM

## 2022-12-16 DIAGNOSIS — E88819 Insulin resistance, unspecified: Secondary | ICD-10-CM | POA: Diagnosis not present

## 2022-12-16 DIAGNOSIS — E669 Obesity, unspecified: Secondary | ICD-10-CM | POA: Diagnosis not present

## 2022-12-16 MED ORDER — ESCITALOPRAM OXALATE 10 MG PO TABS
10.0000 mg | ORAL_TABLET | Freq: Every day | ORAL | 0 refills | Status: DC
  Filled 2022-12-16: qty 180, 180d supply, fill #0
  Filled 2023-01-29: qty 90, 90d supply, fill #0

## 2022-12-16 NOTE — Progress Notes (Signed)
WEIGHT SUMMARY AND BIOMETRICS  Vitals Temp: 98 F (36.7 C) BP: 99/65 Pulse Rate: (!) 59 SpO2: 100 %   Anthropometric Measurements Height: 5\' 6"  (1.676 m) Weight: 192 lb (87.1 kg) BMI (Calculated): 31 Weight at Last Visit: 190lb Weight Lost Since Last Visit: 0 Weight Gained Since Last Visit: 2lb Starting Weight: 281lb Total Weight Loss (lbs): 89 lb (40.4 kg)   Body Composition  Body Fat %: 40.2 % Fat Mass (lbs): 77.4 lbs Muscle Mass (lbs): 109.4 lbs Total Body Water (lbs): 81.2 lbs Visceral Fat Rating : 9   Other Clinical Data Fasting: No Labs: No Today's Visit #: 66 Starting Date: 08/26/17    Chief Complaint:   OBESITY Wanda Peters is here to discuss her progress with her obesity treatment plan. She is on the practicing portion control and making smarter food choices, such as increasing vegetables and decreasing simple carbohydrates and states she is following her eating plan approximately 80 % of the time. She states she is exercising HITT 60 minutes 3 times per week.   Interim History:  Wanda Peters has been in Mx Phase since Winter 2024 Started Program 11/2017- Adipose Mass 133 lbs                                          Visceral Adipose Rating 14   Current-  Muscle Mass 109 lbs Adipose Mass 77 lbs Visceral Adipose Rating 9  Sleep- 7 hrs sleep nightly Exercise-60 mins HITT class 3 x week Hydration-at least 80 oz water/day  Subjective:   1. Status post laparoscopic sleeve gastrectomy/Intestinal malabsorption, unspecified type Discussed Labs  Latest Reference Range & Units 08/18/22 09:36  Thiamine 66.5 - 200.0 nmol/L 112.3  Vitamin D, 25-Hydroxy 30.0 - 100.0 ng/mL 57.2  Vitamin B12 232 - 1,245 pg/mL 411  Globulin, Total 1.5 - 4.5 g/dL 2.5   She estimates that her annual f/u with Bariatric surgeon was to have been on/about May 2024  2. Elevated LDL cholesterol level Discussed Labs Lipid Panel     Component Value Date/Time   CHOL 169  08/18/2022 0936   TRIG 96 08/18/2022 0936   HDL 65 08/18/2022 0936   CHOLHDL 2.6 08/18/2022 0936   CHOLHDL 3.4 09/30/2018 0852   VLDL 11 09/30/2018 0852   LDLCALC 87 08/18/2022 0936   LABVLDL 17 08/18/2022 0936   The 10-year ASCVD risk score (Arnett DK, et al., 2019) is: 0.4%   Values used to calculate the score:     Age: 47 years     Sex: Female     Is Non-Hispanic African American: No     Diabetic: No     Tobacco smoker: No     Systolic Blood Pressure: 99 mmHg     Is BP treated: No     HDL Cholesterol: 65 mg/dL     Total Cholesterol: 169 mg/dL  ALL LEVELS AT GOAL! Not on statin therapy!  3. Vitamin D deficiency Discussed Labs  Latest Reference Range & Units 08/18/22 09:36  Vitamin D, 25-Hydroxy 30.0 - 100.0 ng/mL 57.2  AT GOAL!  4. Insulin resistance Discussed Labs  Latest Reference Range & Units 08/18/22 09:36  Glucose 70 - 99 mg/dL 75  Hemoglobin Z6X 4.8 - 5.6 % 5.4  Est. average glucose Bld gHb Est-mCnc mg/dL 096  INSULIN 2.6 - 04.5 uIU/mL 3.4  AT LEVELS AT GOAL!!!  5. Other  depression, with emotional eating  She endorses stable mood, denies SI/HI She is on daily Lexapro 10mg  every day- takes at bedtime  Assessment/Plan:   1. Status post laparoscopic sleeve gastrectomy/Intestinal malabsorption, unspecified type F/u with Bariatric Surgeon.  2. Elevated LDL cholesterol level Continue healthy eating and regular exercise  3. Vitamin D deficiency Continue Bariatric Supplement  4. Insulin resistance Continue healthy eating and regular exercise  5. Other depression, with emotional eating  Refill - escitalopram (LEXAPRO) 10 MG tablet; Take 1 tablet (10 mg total) by mouth daily  Dispense: 180 tablet; Refill: 0  6. Obesity: current BMI 31  Wanda Peters is currently in the action stage of change. As such, her goal is to maintain weight for now. She has agreed to practicing portion control and making smarter food choices, such as increasing vegetables and decreasing  simple carbohydrates.   Exercise goals: For substantial health benefits, adults should do at least 150 minutes (2 hours and 30 minutes) a week of moderate-intensity, or 75 minutes (1 hour and 15 minutes) a week of vigorous-intensity aerobic physical activity, or an equivalent combination of moderate- and vigorous-intensity aerobic activity. Aerobic activity should be performed in episodes of at least 10 minutes, and preferably, it should be spread throughout the week.  Behavioral modification strategies: increasing lean protein intake, decreasing simple carbohydrates, increasing vegetables, increasing water intake, and planning for success.  Wanda Peters has agreed to follow-up with our clinic in 16 weeks. She was informed of the importance of frequent follow-up visits to maximize her success with intensive lifestyle modifications for her multiple health conditions.   Objective:   Blood pressure 99/65, pulse (!) 59, temperature 98 F (36.7 C), height 5\' 6"  (1.676 m), weight 192 lb (87.1 kg), SpO2 100 %. Body mass index is 30.99 kg/m.  General: Cooperative, alert, well developed, in no acute distress. HEENT: Conjunctivae and lids unremarkable. Cardiovascular: Regular rhythm.  Lungs: Normal work of breathing. Neurologic: No focal deficits.   Lab Results  Component Value Date   CREATININE 0.85 08/18/2022   BUN 12 08/18/2022   NA 140 08/18/2022   K 4.3 08/18/2022   CL 104 08/18/2022   CO2 22 08/18/2022   Lab Results  Component Value Date   ALT 8 08/18/2022   AST 18 08/18/2022   ALKPHOS 63 08/18/2022   BILITOT 0.3 08/18/2022   Lab Results  Component Value Date   HGBA1C 5.4 08/18/2022   HGBA1C 5.4 04/09/2021   HGBA1C 5.2 04/03/2020   HGBA1C 5.1 05/23/2019   HGBA1C 5.4 09/30/2018   Lab Results  Component Value Date   INSULIN 3.4 08/18/2022   INSULIN 2.5 (L) 04/09/2021   INSULIN 4.1 04/03/2020   INSULIN 7.6 05/23/2019   INSULIN 7.9 04/19/2018   Lab Results  Component Value  Date   TSH 4.03 07/14/2017   Lab Results  Component Value Date   CHOL 169 08/18/2022   HDL 65 08/18/2022   LDLCALC 87 08/18/2022   TRIG 96 08/18/2022   CHOLHDL 2.6 08/18/2022   Lab Results  Component Value Date   VD25OH 57.2 08/18/2022   VD25OH 59.3 04/09/2021   VD25OH 63.7 04/03/2020   Lab Results  Component Value Date   WBC 6.5 04/09/2021   HGB 13.2 04/09/2021   HCT 38.2 04/09/2021   MCV 95 04/09/2021   PLT 238 04/09/2021   Lab Results  Component Value Date   IRON 109 04/03/2020   TIBC 268 04/03/2020   FERRITIN 124 07/02/2021   Attestation Statements:   Reviewed  by clinician on day of visit: allergies, medications, problem list, medical history, surgical history, family history, social history, and previous encounter notes.  I have reviewed the above documentation for accuracy and completeness, and I agree with the above. -  Koryn Charlot d. Kenyen Candy, NP-C

## 2022-12-29 ENCOUNTER — Other Ambulatory Visit: Payer: Self-pay

## 2022-12-30 ENCOUNTER — Other Ambulatory Visit: Payer: Self-pay

## 2022-12-30 MED ORDER — LO LOESTRIN FE 1 MG-10 MCG / 10 MCG PO TABS
1.0000 | ORAL_TABLET | Freq: Every day | ORAL | 0 refills | Status: DC
  Filled 2022-12-30: qty 84, 84d supply, fill #0

## 2023-01-14 ENCOUNTER — Encounter (INDEPENDENT_AMBULATORY_CARE_PROVIDER_SITE_OTHER): Payer: Self-pay | Admitting: Adult Health

## 2023-01-14 ENCOUNTER — Telehealth (INDEPENDENT_AMBULATORY_CARE_PROVIDER_SITE_OTHER): Payer: Self-pay | Admitting: Family Medicine

## 2023-01-14 NOTE — Telephone Encounter (Signed)
On 8/7, a call was made to the patient, and a voicemail was left requesting a return call to reschedule an appointment with Dr. Cathey Endow.

## 2023-01-29 ENCOUNTER — Other Ambulatory Visit: Payer: Self-pay

## 2023-03-12 NOTE — Progress Notes (Signed)
Bethanie Dicker, NP-C Phone: [phone]  Wanda Peters is a 47 y.o. female who presents today to establish care and for annual exam.   She is interested in increasing her Lexapro dose. She has been on 10 mg daily for several years. She has found that it helps with her anxiety, depression and trouble sleeping. She has recently had an increase in symptoms and intermittent difficulty sleeping.   Diet: Well balanced- increased protein, Hx gastric sleeve, working with [facility] Exercise: HIIT strength training with personal trainer 3 days per week Pap smear: 2023- abnormal, colpo- normal, seeing Ob-Gyn at the end of the month for f/u Colonoscopy: Never Mammogram: UTD Family history-  Colon cancer: No  Breast cancer: Yes, mother. Patient has had negative genetic testing  Ovarian cancer: No Menses: Amenorrheic with OCP Sexually active: Yes Vaccines-   Flu: Declined today, will get at work  Tetanus: 06/09/2002- Due! 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   Dizziness 10/17/2019   Elevated LDL cholesterol level 07/02/2021   Preventative health care 03/13/2023   Resolved Ambulatory Problems    Diagnosis Date Noted   HYPOGLYCEMIA 05/11/2008   Morbid obesity with BMI of 40.0-44.9, adult (HCC) 05/11/2008   Other acute sinusitis 04/17/2009   Acute upper respiratory infection 09/06/2008   URINARY TRACT INFECTION, RECURRENT 05/11/2008   Dyspnea  12/01/2014   Dysuria 05/31/2015   Shortness of breath on exertion 08/26/2017   Class 3 severe obesity with body mass index (BMI) of 40.0 to 44.9 in adult (HCC) 08/12/2018   Other hyperlipidemia 09/06/2018   Peripheral edema 11/03/2018   Class 1 obesity with serious comorbidity and body mass index (BMI) of 33.0 to 33.9 in adult 09/20/2019   Past Medical History:  Diagnosis Date   Anemia    Dry skin    Rupture of ovarian cyst    Seasonal allergies    Swelling of extremity    Trouble in sleeping     Family History  Problem Relation Age of Onset   Early death Mother    Cancer Mother    Obesity Father    Learning disabilities Son    Alcohol abuse Paternal Grandfather     Social History   Socioeconomic History   Marital status: Married    Spouse name: Deija Buhrman   Number of children: 2   Years of education: Not on file   Highest education level: Not on file  Occupational History   Occupation: Engineering geologist: [employer]  Tobacco Use   Smoking status: Never   Smokeless tobacco: Never  Vaping Use   Vaping status: Never Used  Substance and Sexual Activity   Alcohol use: Not Currently    Comment: socially   Drug use: No   Sexual activity: Not on file  Other Topics Concern   Not on file  Social History Narrative   Not on file   Social Determinants of Health   Financial Resource Strain: Not on  file  Food Insecurity: Not on file  Transportation Needs: Not on file  Physical Activity: Not on file  Stress: Not on file  Social Connections: Not on file  Intimate Partner Violence: Not on file    ROS  General:  Negative for unexplained weight loss, fever Skin: Negative for new or changing mole, sore that won't heal HEENT: Negative for trouble hearing, trouble seeing, ringing in ears, mouth sores, hoarseness, change in voice, dysphagia. CV:  Negative for chest pain, dyspnea, edema, palpitations Resp: Negative for cough, dyspnea, hemoptysis GI: Negative for  nausea, vomiting, diarrhea, constipation, abdominal pain, melena, hematochezia. GU: Negative for dysuria, incontinence, urinary hesitance, hematuria, vaginal or penile discharge, polyuria, sexual difficulty, lumps in testicle or breasts MSK: Negative for muscle cramps or aches, joint pain or swelling Neuro: Negative for headaches, weakness, numbness, dizziness, passing out/fainting Psych: Negative for depression, anxiety, memory problems  Objective  Physical Exam Vitals:   03/13/23 0844  BP: 120/76  Pulse: 69  Temp: 98.6 F (37 C)  SpO2: 98%    BP Readings from Last 3 Encounters:  03/13/23 120/76  12/16/22 99/65  08/18/22 104/69   Wt Readings from Last 3 Encounters:  03/13/23 198 lb 3.2 oz (89.9 kg)  12/16/22 192 lb (87.1 kg)  08/18/22 190 lb (86.2 kg)    Physical Exam Constitutional:      General: She is not in acute distress.    Appearance: Normal appearance.  HENT:     Head: Normocephalic.     Right Ear: Tympanic membrane normal.     Left Ear: Tympanic membrane normal.     Nose: Nose normal.     Mouth/Throat:     Mouth: Mucous membranes are moist.     Pharynx: Oropharynx is clear.  Eyes:     Conjunctiva/sclera: Conjunctivae normal.     Pupils: Pupils are equal, round, and reactive to light.  Neck:     Thyroid: No thyromegaly.  Cardiovascular:     Rate and Rhythm: Normal rate and regular rhythm.     Heart sounds: Normal heart sounds.  Pulmonary:     Effort: Pulmonary effort is normal.     Breath sounds: Normal breath sounds.  Abdominal:     General: Abdomen is flat. Bowel sounds are normal.     Palpations: Abdomen is soft. There is no mass.     Tenderness: There is no abdominal tenderness.  Musculoskeletal:        General: Normal range of motion.  Lymphadenopathy:     Cervical: No cervical adenopathy.  Skin:    General: Skin is warm and dry.     Findings: No rash.  Neurological:     General: No focal deficit present.     Mental Status: She is alert.   Psychiatric:        Mood and Affect: Mood normal.        Behavior: Behavior normal.    Assessment/Plan:   Preventative health care Assessment & Plan: Physical exam complete. Patient declined lab work today, she completes annually with [facility]. Labs from March reviewed. Pap- UTD, she will follow up with Ob-Gyn this month. Mammogram- UTD. Colonoscopy- due, patient agreeable to Cologuard. Order placed, encouraged to complete as soon as it arrives. Flu vaccine- will get at work. Tetanus vaccine- given today in office. Declined additional COVID vaccines. HIV and Hep C screenings deferred. Recommended follow ups with Dentist and Ophthalmology for annual exams. Encouraged to continue healthy diet and exercise. Return to  care in one year, sooner PRN.    Other depression, with emotional eating  Assessment & Plan: Chronic issue. Has been on Lexapro 10 mg daily for several years. Will increase to 15 mg daily (1.5 tablets). Counseled patient on common side effects. Encouraged to contact if worsening symptoms, unusual behavior changes or suicidal thoughts occur. She will contact if this is not helping and she would like to increase to 20 mg daily. Will continue to monitor.    Need for Tdap vaccination -     Tdap vaccine greater than or equal to 7yo IM  Screen for colon cancer -     Cologuard    Return in about 1 year (around 03/12/2024) for Annual Exam, sooner PRN.   Bethanie Dicker, NP-C [facility] [employer]

## 2023-03-13 ENCOUNTER — Encounter: Payer: Self-pay | Admitting: Nurse Practitioner

## 2023-03-13 ENCOUNTER — Ambulatory Visit: Payer: Commercial Managed Care - PPO | Admitting: Nurse Practitioner

## 2023-03-13 VITALS — BP 120/76 | HR 69 | Temp 98.6°F | Ht 66.0 in | Wt 198.2 lb

## 2023-03-13 DIAGNOSIS — F3289 Other specified depressive episodes: Secondary | ICD-10-CM

## 2023-03-13 DIAGNOSIS — Z0000 Encounter for general adult medical examination without abnormal findings: Secondary | ICD-10-CM | POA: Insufficient documentation

## 2023-03-13 DIAGNOSIS — Z1211 Encounter for screening for malignant neoplasm of colon: Secondary | ICD-10-CM

## 2023-03-13 DIAGNOSIS — Z23 Encounter for immunization: Secondary | ICD-10-CM

## 2023-03-13 NOTE — Assessment & Plan Note (Signed)
Chronic issue. Has been on Lexapro 10 mg daily for several years. Will increase to 15 mg daily (1.5 tablets). Counseled patient on common side effects. Encouraged to contact if worsening symptoms, unusual behavior changes or suicidal thoughts occur. She will contact if this is not helping and she would like to increase to 20 mg daily. Will continue to monitor.

## 2023-03-13 NOTE — Assessment & Plan Note (Signed)
Physical exam complete. Patient declined lab work today, she completes annually with [facility]. Labs from March reviewed. Pap- UTD, she will follow up with Ob-Gyn this month. Mammogram- UTD. Colonoscopy- due, patient agreeable to Cologuard. Order placed, encouraged to complete as soon as it arrives. Flu vaccine- will get at work. Tetanus vaccine- given today in office. Declined additional COVID vaccines. HIV and Hep C screenings deferred. Recommended follow ups with Dentist and Ophthalmology for annual exams. Encouraged to continue healthy diet and exercise. Return to care in one year, sooner PRN.

## 2023-03-17 ENCOUNTER — Encounter: Payer: Self-pay | Admitting: Nurse Practitioner

## 2023-03-17 ENCOUNTER — Other Ambulatory Visit (HOSPITAL_COMMUNITY): Payer: Self-pay

## 2023-03-18 ENCOUNTER — Other Ambulatory Visit (HOSPITAL_COMMUNITY): Payer: Self-pay

## 2023-03-18 MED ORDER — LO LOESTRIN FE 1 MG-10 MCG / 10 MCG PO TABS
1.0000 | ORAL_TABLET | Freq: Every day | ORAL | 0 refills | Status: DC
  Filled 2023-03-18 – 2023-03-20 (×2): qty 84, 84d supply, fill #0

## 2023-03-20 ENCOUNTER — Other Ambulatory Visit: Payer: Self-pay

## 2023-03-20 ENCOUNTER — Other Ambulatory Visit (HOSPITAL_COMMUNITY): Payer: Self-pay

## 2023-03-24 ENCOUNTER — Other Ambulatory Visit: Payer: Self-pay | Admitting: Medical Genetics

## 2023-03-24 DIAGNOSIS — Z006 Encounter for examination for normal comparison and control in clinical research program: Secondary | ICD-10-CM

## 2023-03-27 ENCOUNTER — Other Ambulatory Visit (HOSPITAL_COMMUNITY): Payer: Self-pay

## 2023-03-27 ENCOUNTER — Other Ambulatory Visit: Payer: Self-pay | Admitting: Nurse Practitioner

## 2023-03-27 ENCOUNTER — Telehealth: Payer: Self-pay

## 2023-03-27 DIAGNOSIS — L309 Dermatitis, unspecified: Secondary | ICD-10-CM

## 2023-03-27 LAB — COLOGUARD

## 2023-03-27 MED ORDER — LIDOCAINE 4 % EX SOLN
CUTANEOUS | 1 refills | Status: DC
  Filled 2023-03-27: qty 100, fill #0

## 2023-03-27 MED ORDER — CLOBETASOL PROPIONATE 0.05 % EX SOLN
1.0000 | Freq: Two times a day (BID) | CUTANEOUS | 1 refills | Status: DC
  Filled 2023-03-27 – 2023-03-30 (×3): qty 50, 25d supply, fill #0

## 2023-03-27 NOTE — Telephone Encounter (Signed)
Detailed VM left informing pt:   Bethanie Dicker, NP  [facility] Her Cologuard sample could not be processed. They should be contacting her to provide a new one.

## 2023-03-30 ENCOUNTER — Other Ambulatory Visit: Payer: Self-pay

## 2023-03-30 ENCOUNTER — Other Ambulatory Visit (HOSPITAL_COMMUNITY): Payer: Self-pay

## 2023-03-31 ENCOUNTER — Other Ambulatory Visit: Payer: Self-pay

## 2023-03-31 DIAGNOSIS — Z1231 Encounter for screening mammogram for malignant neoplasm of breast: Secondary | ICD-10-CM | POA: Diagnosis not present

## 2023-03-31 DIAGNOSIS — Z124 Encounter for screening for malignant neoplasm of cervix: Secondary | ICD-10-CM | POA: Diagnosis not present

## 2023-03-31 DIAGNOSIS — L282 Other prurigo: Secondary | ICD-10-CM | POA: Diagnosis not present

## 2023-03-31 DIAGNOSIS — Z6831 Body mass index (BMI) 31.0-31.9, adult: Secondary | ICD-10-CM | POA: Diagnosis not present

## 2023-03-31 DIAGNOSIS — Z01419 Encounter for gynecological examination (general) (routine) without abnormal findings: Secondary | ICD-10-CM | POA: Diagnosis not present

## 2023-03-31 DIAGNOSIS — N83209 Unspecified ovarian cyst, unspecified side: Secondary | ICD-10-CM | POA: Diagnosis not present

## 2023-03-31 DIAGNOSIS — Z1151 Encounter for screening for human papillomavirus (HPV): Secondary | ICD-10-CM | POA: Diagnosis not present

## 2023-03-31 MED ORDER — NYSTATIN-TRIAMCINOLONE 100000-0.1 UNIT/GM-% EX OINT
1.0000 | TOPICAL_OINTMENT | Freq: Two times a day (BID) | CUTANEOUS | 0 refills | Status: DC | PRN
  Filled 2023-03-31: qty 30, 30d supply, fill #0

## 2023-03-31 MED ORDER — LO LOESTRIN FE 1 MG-10 MCG / 10 MCG PO TABS
1.0000 | ORAL_TABLET | Freq: Every day | ORAL | 3 refills | Status: DC
  Filled 2023-03-31 – 2023-06-14 (×2): qty 84, 84d supply, fill #0
  Filled 2023-09-03: qty 84, 84d supply, fill #1
  Filled 2023-11-26: qty 84, 84d supply, fill #2
  Filled 2024-02-19: qty 84, 84d supply, fill #3

## 2023-04-08 ENCOUNTER — Other Ambulatory Visit (HOSPITAL_COMMUNITY): Payer: Self-pay

## 2023-04-09 ENCOUNTER — Ambulatory Visit (INDEPENDENT_AMBULATORY_CARE_PROVIDER_SITE_OTHER): Payer: Commercial Managed Care - PPO | Admitting: Adult Health

## 2023-04-09 ENCOUNTER — Other Ambulatory Visit: Payer: Self-pay

## 2023-04-09 ENCOUNTER — Encounter (INDEPENDENT_AMBULATORY_CARE_PROVIDER_SITE_OTHER): Payer: Self-pay | Admitting: Adult Health

## 2023-04-09 ENCOUNTER — Ambulatory Visit (INDEPENDENT_AMBULATORY_CARE_PROVIDER_SITE_OTHER): Payer: Commercial Managed Care - PPO | Admitting: Family Medicine

## 2023-04-09 ENCOUNTER — Other Ambulatory Visit (HOSPITAL_COMMUNITY): Payer: Self-pay

## 2023-04-09 VITALS — BP 99/65 | HR 61 | Temp 98.2°F | Ht 66.0 in | Wt 195.0 lb

## 2023-04-09 DIAGNOSIS — E669 Obesity, unspecified: Secondary | ICD-10-CM | POA: Diagnosis not present

## 2023-04-09 DIAGNOSIS — Z6831 Body mass index (BMI) 31.0-31.9, adult: Secondary | ICD-10-CM

## 2023-04-09 DIAGNOSIS — F3289 Other specified depressive episodes: Secondary | ICD-10-CM

## 2023-04-09 DIAGNOSIS — E559 Vitamin D deficiency, unspecified: Secondary | ICD-10-CM

## 2023-04-09 DIAGNOSIS — E66813 Obesity, class 3: Secondary | ICD-10-CM

## 2023-04-09 DIAGNOSIS — E88819 Insulin resistance, unspecified: Secondary | ICD-10-CM

## 2023-04-09 MED ORDER — ESCITALOPRAM OXALATE 20 MG PO TABS
20.0000 mg | ORAL_TABLET | Freq: Every day | ORAL | 0 refills | Status: DC
  Filled 2023-04-09: qty 90, 90d supply, fill #0

## 2023-04-09 NOTE — Progress Notes (Signed)
WEIGHT SUMMARY AND BIOMETRICS  Vitals Temp: 98.2 F (36.8 C) BP: 99/65 Pulse Rate: 61 SpO2: 100 %   Anthropometric Measurements Height: 5\' 6"  (1.676 m) Weight: 195 lb (88.5 kg) BMI (Calculated): 31.49 Weight at Last Visit: 192lb Weight Lost Since Last Visit: 0 Weight Gained Since Last Visit: 3lb Starting Weight: 281lb Total Weight Loss (lbs): 86 lb (39 kg)   Body Composition  Body Fat %: 41.5 % Fat Mass (lbs): 81.2 lbs Muscle Mass (lbs): 108.8 lbs Total Body Water (lbs): 81 lbs Visceral Fat Rating : 10   Other Clinical Data Fasting: no Labs: no Today's Visit #: 48 Starting Date: 08/26/17    Chief Complaint:   OBESITY Wanda Peters is here to discuss her progress with her obesity treatment plan. She is on the practicing portion control and making smarter food choices, such as increasing vegetables and decreasing simple carbohydrates and states she is following her eating plan approximately 100 % of the time. She states she is exercising HITT 60 minutes 3 times per week.   Interim History:  She is followed by [facility] Q 12 weeks  Sleep- She reports recent breakthrough anxiety and disruption of sleep. She has been on Lexapro 10mg  since 04/2019 She has increased Lexapro to 15mg  every day after a week increased to 20mg  She reports resolution of anxiety and insomnia.  Exercise-HITT 60 mins- Mon/Wed evening and Sat morning  Hydration-she estimates to drink 70-80 oz water/day  TWL 83 lbs! Current weight 195 lbs, goal weight 180s  Subjective:   1. Vitamin D deficiency  Latest Reference Range & Units 08/18/22 09:36  Vitamin D, 25-Hydroxy 30.0 - 100.0 ng/mL 57.2   She endorses stable energy levels She is on daily OTC Vit D 3 1000 international units   2. Insulin resistance  Latest Reference Range & Units 04/03/20 08:47 04/09/21 09:56 08/18/22 09:36  INSULIN 2.6 - 24.9 uIU/mL 4.1 2.5 (L) 3.4  (L): Data is abnormally low  She denies family hx of MEN 2 or  MTC She denies personal hx of pancreatitis  She is 100% compliant with daily Lo Loestrin Fe  She would like to try GLP-1 therapy to assist with   3. Other depression, with emotional eating  She reports recent breakthrough anxiety and disruption of sleep. She has been on Lexapro 10mg  since 04/2019 She has increased Lexapro to 15mg  every day after a week increased to 20mg  She reports resolution of anxiety and insomnia.  She denies SI/HI  Of Note- Previously on Wellbutrin 200mg - weaned off early 2021  Assessment/Plan:   1. Vitamin D deficiency Continue  2. Insulin resistance If insurance will cover- start AOMs Reginal Lutes) Jan 2025  3. Other depression, with emotional eating  Refill and INCREASE escitalopram (LEXAPRO) 20 MG tablet Take 1 tablet (20 mg total) by mouth daily. Dispense: 90 tablet, Refills: 0 of 0 remaining   4. Obesity: current BMI 31.49  [name] is currently in the action stage of change. As such, her goal is to continue with weight loss efforts. She has agreed to practicing portion control and making smarter food choices, such as increasing vegetables and decreasing simple carbohydrates.   Exercise goals: For substantial health benefits, adults should do at least 150 minutes (2 hours and 30 minutes) a week of moderate-intensity, or 75 minutes (1 hour and 15 minutes) a week of vigorous-intensity aerobic physical activity, or an equivalent combination of moderate- and vigorous-intensity aerobic activity. Aerobic activity should be performed in episodes of at least 10  minutes, and preferably, it should be spread throughout the week.  Behavioral modification strategies: increasing lean protein intake, decreasing simple carbohydrates, increasing vegetables, increasing water intake, decreasing liquid calories, keeping healthy foods in the home, ways to avoid boredom eating, ways to avoid night time snacking, and planning for success.  [name] has agreed to follow-up with  our clinic in 12 weeks. She was informed of the importance of frequent follow-up visits to maximize her success with intensive lifestyle modifications for her multiple health conditions.   Objective:   Blood pressure 99/65, pulse 61, temperature 98.2 F (36.8 C), height 5\' 6"  (1.676 m), weight 195 lb (88.5 kg), last menstrual period 03/10/2023, SpO2 100%. Body mass index is 31.47 kg/m.  General: Cooperative, alert, well developed, in no acute distress. HEENT: Conjunctivae and lids unremarkable. Cardiovascular: Regular rhythm.  Lungs: Normal work of breathing. Neurologic: No focal deficits.   Lab Results  Component Value Date   CREATININE 0.85 08/18/2022   BUN 12 08/18/2022   NA 140 08/18/2022   K 4.3 08/18/2022   CL 104 08/18/2022   CO2 22 08/18/2022   Lab Results  Component Value Date   ALT 8 08/18/2022   AST 18 08/18/2022   ALKPHOS 63 08/18/2022   BILITOT 0.3 08/18/2022   Lab Results  Component Value Date   HGBA1C 5.4 08/18/2022   HGBA1C 5.4 04/09/2021   HGBA1C 5.2 04/03/2020   HGBA1C 5.1 05/23/2019   HGBA1C 5.4 09/30/2018   Lab Results  Component Value Date   INSULIN 3.4 08/18/2022   INSULIN 2.5 (L) 04/09/2021   INSULIN 4.1 04/03/2020   INSULIN 7.6 05/23/2019   INSULIN 7.9 04/19/2018   Lab Results  Component Value Date   TSH 4.03 07/14/2017   Lab Results  Component Value Date   CHOL 169 08/18/2022   HDL 65 08/18/2022   LDLCALC 87 08/18/2022   TRIG 96 08/18/2022   CHOLHDL 2.6 08/18/2022   Lab Results  Component Value Date   VD25OH 57.2 08/18/2022   VD25OH 59.3 04/09/2021   VD25OH 63.7 04/03/2020   Lab Results  Component Value Date   WBC 6.5 04/09/2021   HGB 13.2 04/09/2021   HCT 38.2 04/09/2021   MCV 95 04/09/2021   PLT 238 04/09/2021   Lab Results  Component Value Date   IRON 109 04/03/2020   TIBC 268 04/03/2020   FERRITIN 124 07/02/2021   Attestation Statements:   Reviewed by clinician on day of visit: allergies, medications,  problem list, medical history, surgical history, family history, social history, and previous encounter notes.  I have reviewed the above documentation for accuracy and completeness, and I agree with the above. -  Stuart Guillen d. Kashara Blocher, NP-C

## 2023-04-14 ENCOUNTER — Other Ambulatory Visit (HOSPITAL_COMMUNITY): Payer: Self-pay

## 2023-04-14 ENCOUNTER — Other Ambulatory Visit: Payer: Commercial Managed Care - PPO

## 2023-04-16 ENCOUNTER — Other Ambulatory Visit
Admission: RE | Admit: 2023-04-16 | Discharge: 2023-04-16 | Disposition: A | Discharge status: Home / Self Care | Payer: Commercial Managed Care - PPO | Source: Ambulatory Visit | Attending: Medical Genetics | Admitting: Medical Genetics

## 2023-04-16 DIAGNOSIS — Z006 Encounter for examination for normal comparison and control in clinical research program: Secondary | ICD-10-CM | POA: Insufficient documentation

## 2023-04-19 DIAGNOSIS — Z1211 Encounter for screening for malignant neoplasm of colon: Secondary | ICD-10-CM | POA: Diagnosis not present

## 2023-04-26 LAB — COLOGUARD: COLOGUARD: NEGATIVE

## 2023-04-28 LAB — GENECONNECT MOLECULAR SCREEN

## 2023-04-28 LAB — HELIX MOLECULAR SCREEN: Genetic Analysis Overall Interpretation: NEGATIVE

## 2023-05-20 ENCOUNTER — Encounter (HOSPITAL_COMMUNITY): Payer: Self-pay | Admitting: *Deleted

## 2023-06-11 ENCOUNTER — Ambulatory Visit (INDEPENDENT_AMBULATORY_CARE_PROVIDER_SITE_OTHER): Payer: Commercial Managed Care - PPO | Admitting: Adult Health

## 2023-06-11 ENCOUNTER — Encounter (INDEPENDENT_AMBULATORY_CARE_PROVIDER_SITE_OTHER): Payer: Self-pay | Admitting: Adult Health

## 2023-06-11 ENCOUNTER — Other Ambulatory Visit: Payer: Self-pay

## 2023-06-11 VITALS — BP 103/68 | HR 59 | Temp 98.3°F | Ht 66.0 in | Wt 194.0 lb

## 2023-06-11 DIAGNOSIS — E88819 Insulin resistance, unspecified: Secondary | ICD-10-CM | POA: Diagnosis not present

## 2023-06-11 DIAGNOSIS — F5089 Other specified eating disorder: Secondary | ICD-10-CM | POA: Diagnosis not present

## 2023-06-11 DIAGNOSIS — E559 Vitamin D deficiency, unspecified: Secondary | ICD-10-CM | POA: Diagnosis not present

## 2023-06-11 DIAGNOSIS — F3289 Other specified depressive episodes: Secondary | ICD-10-CM | POA: Diagnosis not present

## 2023-06-11 DIAGNOSIS — E66813 Obesity, class 3: Secondary | ICD-10-CM

## 2023-06-11 DIAGNOSIS — Z6831 Body mass index (BMI) 31.0-31.9, adult: Secondary | ICD-10-CM | POA: Diagnosis not present

## 2023-06-11 DIAGNOSIS — E669 Obesity, unspecified: Secondary | ICD-10-CM | POA: Diagnosis not present

## 2023-06-11 DIAGNOSIS — E7800 Pure hypercholesterolemia, unspecified: Secondary | ICD-10-CM | POA: Diagnosis not present

## 2023-06-11 MED ORDER — ESCITALOPRAM OXALATE 20 MG PO TABS
20.0000 mg | ORAL_TABLET | Freq: Every day | ORAL | 0 refills | Status: DC
  Filled 2023-06-11 – 2023-07-09 (×2): qty 90, 90d supply, fill #0

## 2023-06-11 MED ORDER — WEGOVY 0.25 MG/0.5ML ~~LOC~~ SOAJ
0.2500 mg | SUBCUTANEOUS | 0 refills | Status: DC
  Filled 2023-06-11: qty 2, 28d supply, fill #0

## 2023-06-11 NOTE — Progress Notes (Addendum)
 WEIGHT SUMMARY AND BIOMETRICS  Vitals Temp: 98.3 F (36.8 C) BP: 103/68 Pulse Rate: (!) 59 SpO2: 98 %   Anthropometric Measurements Height: 5\' 6"  (1.676 m) Weight: 194 lb (88 kg) BMI (Calculated): 31.33 Weight at Last Visit: 195 lb Weight Lost Since Last Visit: 1 lb Weight Gained Since Last Visit: 0 Starting Weight: 281 lb Total Weight Loss (lbs): 87 lb (39.5 kg)   Body Composition  Body Fat %: 41.2 % Fat Mass (lbs): 80.2 lbs Muscle Mass (lbs): 108.8 lbs Total Body Water (lbs): 80.2 lbs Visceral Fat Rating : 9   Other Clinical Data Fasting: no Labs: no Today's Visit #: 58 Starting Date: 08/26/17    Chief Complaint:   OBESITY Wanda Peters is here to discuss her progress with her obesity treatment plan. She is on the practicing portion control and making smarter food choices, such as increasing vegetables and decreasing simple carbohydrates and states she is following her eating plan approximately 100 % of the time. She states she is exercising Strength Training 60 minutes 3 times per week.   Interim History:  She is followed by [facility] Q 12 weeks   04/09/2023- Lexapro to 15mg  every day after a week increased to 20mg  She reports resolution of anxiety and insomnia. She endorses stable mood  Reviewed Bioimpedance results with pt: Muscle Mass: No Change Adipose Mass: -1 lb  TWL 88 lbs  2025 Health Goals: 1) Loss another 20-25 lbs, current weight 194 lbs  She lives with her husband and 51 year old son. Her 38 year old daughter is a Consulting civil engineer at [employer]  Subjective:   1. Elevated LDL cholesterol level Lipid Panel     Component Value Date/Time   CHOL 169 08/18/2022 0936   TRIG 96 08/18/2022 0936   HDL 65 08/18/2022 0936   CHOLHDL 2.6 08/18/2022 0936   CHOLHDL 3.4 09/30/2018 0852   VLDL 11 09/30/2018 0852   LDLCALC 87 08/18/2022 0936   LABVLDL 17 08/18/2022 0936    She is not on any statin therapy at present  2. Insulin resistance She denies family  hx of MENS 2 or MTC She denies personal hx of pancreatitis She is on daily Lo Loestrin Fe Discussed risks/benefits of GLP-1 or GLP-1/GIP therapy  3. Vitamin D deficiency She endorses stable energy levels  4. Other depression, with emotional eating  04/09/2023- Lexapro to 15mg  every day after a week increased to 20mg  She reports resolution of anxiety and insomnia. She endorses stable mood She denies SI/HI  Assessment/Plan:   1. Elevated LDL cholesterol level Check Labs at next OV  2. Insulin resistance Check Labs at next OV  3. Vitamin D deficiency (Primary) Check Labs at next OV  4. Other depression, with emotional eating  Refill escitalopram (LEXAPRO) 20 MG tablet Take 1 tablet (20 mg total) by mouth daily. Dispense: 90 tablet, Refills: 0 ordered   5. Obesity: current BMI 31.33 Start Semaglutide-Weight Management (WEGOVY) 0.25 MG/0.5ML SOAJ Inject 0.25 mg into the skin once a week. Dispense: 2 mL, Refills: 0 of 0 remaining   If able to obtain 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 As such, her goal is to continue with weight loss efforts. She has agreed to practicing portion control and making smarter food choices, such as increasing vegetables and decreasing simple carbohydrates.   Exercise  goals: For substantial health benefits, adults should do at least 150 minutes (2 hours and 30 minutes) a week of moderate-intensity, or 75 minutes (1 hour and 15 minutes) a week of vigorous-intensity aerobic physical activity, or an equivalent combination of moderate- and vigorous-intensity aerobic activity. Aerobic activity should be performed in episodes of at least 10 minutes, and preferably, it should be spread throughout the week.  Behavioral modification strategies: increasing lean protein intake, decreasing simple  carbohydrates, increasing vegetables, increasing water intake, meal planning and cooking strategies, keeping healthy foods in the home, ways to avoid boredom eating, ways to avoid night time snacking, and planning for success.  Wanda Peters has agreed to follow-up with our clinic in 12 weeks. She was informed of the importance of frequent follow-up visits to maximize her success with intensive lifestyle modifications for her multiple health conditions.   Check Fasting Labs and IC at next OV- pt aware to arrive 30 mins prior to OV and to be fasting  Objective:   Blood pressure 103/68, pulse (!) 59, temperature 98.3 F (36.8 C), height 5\' 6"  (1.676 m), weight 194 lb (88 kg), SpO2 98%. Body mass index is 31.31 kg/m.  General: Cooperative, alert, well developed, in no acute distress. HEENT: Conjunctivae and lids unremarkable. Cardiovascular: Regular rhythm.  Lungs: Normal work of breathing. Neurologic: No focal deficits.   Lab Results  Component Value Date   CREATININE 0.85 08/18/2022   BUN 12 08/18/2022   NA 140 08/18/2022   K 4.3 08/18/2022   CL 104 08/18/2022   CO2 22 08/18/2022   Lab Results  Component Value Date   ALT 8 08/18/2022   AST 18 08/18/2022   ALKPHOS 63 08/18/2022   BILITOT 0.3 08/18/2022   Lab Results  Component Value Date   HGBA1C 5.4 08/18/2022   HGBA1C 5.4 04/09/2021   HGBA1C 5.2 04/03/2020   HGBA1C 5.1 05/23/2019   HGBA1C 5.4 09/30/2018   Lab Results  Component Value Date   INSULIN 3.4 08/18/2022   INSULIN 2.5 (L) 04/09/2021   INSULIN 4.1 04/03/2020   INSULIN 7.6 05/23/2019   INSULIN 7.9 04/19/2018   Lab Results  Component Value Date   TSH 4.03 07/14/2017   Lab Results  Component Value Date   CHOL 169 08/18/2022   HDL 65 08/18/2022   LDLCALC 87 08/18/2022   TRIG 96 08/18/2022   CHOLHDL 2.6 08/18/2022   Lab Results  Component Value Date   VD25OH 57.2 08/18/2022   VD25OH 59.3 04/09/2021   VD25OH 63.7 04/03/2020   Lab Results  Component  Value Date   WBC 6.5 04/09/2021   HGB 13.2 04/09/2021   HCT 38.2 04/09/2021   MCV 95 04/09/2021   PLT 238 04/09/2021   Lab Results  Component Value Date   IRON 109 04/03/2020   TIBC 268 04/03/2020   FERRITIN 124 07/02/2021   Attestation Statements:   Reviewed by clinician on day of visit: allergies, medications, problem list, medical history, surgical history, family history, social history, and previous encounter notes.  I have reviewed the above documentation for accuracy and completeness, and I agree with the above. -  Breckyn Troyer d. Azuri Bozard, NP-C

## 2023-06-11 NOTE — Telephone Encounter (Signed)
**Note De-identified  [place] Obfuscation** Please advise 

## 2023-06-14 ENCOUNTER — Other Ambulatory Visit: Payer: Self-pay

## 2023-07-04 ENCOUNTER — Telehealth: Payer: Commercial Managed Care - PPO | Admitting: Nurse Practitioner

## 2023-07-04 DIAGNOSIS — J019 Acute sinusitis, unspecified: Secondary | ICD-10-CM | POA: Diagnosis not present

## 2023-07-04 DIAGNOSIS — B9689 Other specified bacterial agents as the cause of diseases classified elsewhere: Secondary | ICD-10-CM | POA: Diagnosis not present

## 2023-07-04 MED ORDER — AMOXICILLIN-POT CLAVULANATE 875-125 MG PO TABS: 1.0000 | ORAL_TABLET | Freq: Two times a day (BID) | ORAL | 0 refills | Status: AC

## 2023-07-04 MED ORDER — IPRATROPIUM BROMIDE 0.03 % NA SOLN: 2.0000 | Freq: Two times a day (BID) | NASAL | 12 refills | Status: DC

## 2023-07-04 NOTE — Progress Notes (Signed)
E-Visit for Sinus Problems  We are sorry that you are not feeling well.  Here is how we plan to help!  Based on what you have shared with me it looks like you have sinusitis.  Sinusitis is inflammation and infection in the sinus cavities of the head.  Based on your presentation I believe you most likely have Acute Bacterial Sinusitis.  This is an infection caused by bacteria and is treated with antibiotics. I have prescribed Augmentin 875mg /125mg  one tablet twice daily with food, for 7 days. We also sent in a steroid nose spray for inflammation. Prednisone would not be warranted at this time. You may use an oral decongestant such as Mucinex D or if you have glaucoma or high blood pressure use plain Mucinex. Saline nasal spray help and can safely be used as often as needed for congestion.  If you develop worsening sinus pain, fever or notice severe headache and vision changes, or if symptoms are not better after completion of antibiotic, please schedule an appointment with a health care provider.    Sinus infections are not as easily transmitted as other respiratory infection, however we still recommend that you avoid close contact with loved ones, especially the very young and elderly.  Remember to wash your hands thoroughly throughout the day as this is the number one way to prevent the spread of infection!  Home Care: Only take medications as instructed by your medical team. Complete the entire course of an antibiotic. Do not take these medications with alcohol. A steam or ultrasonic humidifier can help congestion.  You can place a towel over your head and breathe in the steam from hot water coming from a faucet. Avoid close contacts especially the very young and the elderly. Cover your mouth when you cough or sneeze. Always remember to wash your hands.  Get Help Right Away If: You develop worsening fever or sinus pain. You develop a severe head ache or visual changes. Your symptoms persist  after you have completed your treatment plan.  Make sure you Understand these instructions. Will watch your condition. Will get help right away if you are not doing well or get worse.  Thank you for choosing an e-visit.  Your e-visit answers were reviewed by a board certified advanced clinical practitioner to complete your personal care plan. Depending upon the condition, your plan could have included both over the counter or prescription medications.  Please review your pharmacy choice. Make sure the pharmacy is open so you can pick up prescription now. If there is a problem, you may contact your provider through [employer] and have the prescription routed to another pharmacy.  Your safety is important to [name]. If you have drug allergies check your prescription carefully.   For the next 24 hours you can use MyChart to ask questions about today's visit, request a non-urgent call back, or ask for a work or school excuse. You will get an email in the next two days asking about your experience. I hope that your e-visit has been valuable and will speed your recovery.

## 2023-07-04 NOTE — Progress Notes (Signed)
I have spent 5 minutes in review of e-visit questionnaire, review and updating patient chart, medical decision making and response to patient.   Claiborne Rigg, NP

## 2023-07-10 ENCOUNTER — Other Ambulatory Visit: Payer: Self-pay

## 2023-07-29 ENCOUNTER — Telehealth: Payer: Commercial Managed Care - PPO | Admitting: Physician Assistant

## 2023-07-29 DIAGNOSIS — R3989 Other symptoms and signs involving the genitourinary system: Secondary | ICD-10-CM | POA: Diagnosis not present

## 2023-07-29 MED ORDER — CEPHALEXIN 500 MG PO CAPS: 500.0000 mg | ORAL_CAPSULE | Freq: Two times a day (BID) | ORAL | 0 refills | Status: AC

## 2023-07-29 NOTE — Progress Notes (Signed)
 I have spent 5 minutes in review of e-visit questionnaire, review and updating patient chart, medical decision making and response to patient.   Piedad Climes, PA-C

## 2023-07-29 NOTE — Progress Notes (Signed)

## 2023-09-03 ENCOUNTER — Ambulatory Visit (INDEPENDENT_AMBULATORY_CARE_PROVIDER_SITE_OTHER): Payer: Commercial Managed Care - PPO | Admitting: Adult Health

## 2023-09-03 ENCOUNTER — Encounter (INDEPENDENT_AMBULATORY_CARE_PROVIDER_SITE_OTHER): Payer: Self-pay | Admitting: Adult Health

## 2023-09-03 VITALS — BP 114/66 | HR 62 | Temp 98.3°F | Ht 66.0 in | Wt 195.0 lb

## 2023-09-03 DIAGNOSIS — E7800 Pure hypercholesterolemia, unspecified: Secondary | ICD-10-CM

## 2023-09-03 DIAGNOSIS — E88819 Insulin resistance, unspecified: Secondary | ICD-10-CM | POA: Diagnosis not present

## 2023-09-03 DIAGNOSIS — E669 Obesity, unspecified: Secondary | ICD-10-CM | POA: Diagnosis not present

## 2023-09-03 DIAGNOSIS — F3289 Other specified depressive episodes: Secondary | ICD-10-CM | POA: Diagnosis not present

## 2023-09-03 DIAGNOSIS — E559 Vitamin D deficiency, unspecified: Secondary | ICD-10-CM

## 2023-09-03 DIAGNOSIS — F5089 Other specified eating disorder: Secondary | ICD-10-CM

## 2023-09-03 DIAGNOSIS — Z6831 Body mass index (BMI) 31.0-31.9, adult: Secondary | ICD-10-CM

## 2023-09-03 DIAGNOSIS — E66813 Obesity, class 3: Secondary | ICD-10-CM

## 2023-09-03 DIAGNOSIS — Z0000 Encounter for general adult medical examination without abnormal findings: Secondary | ICD-10-CM | POA: Diagnosis not present

## 2023-09-03 MED ORDER — ESCITALOPRAM OXALATE 20 MG PO TABS: 20.0000 mg | ORAL_TABLET | Freq: Every day | ORAL | 0 refills | Status: DC

## 2023-09-03 MED ORDER — TIRZEPATIDE-WEIGHT MANAGEMENT 2.5 MG/0.5ML ~~LOC~~ SOLN: 2.5000 mg | SUBCUTANEOUS | 0 refills | Status: DC

## 2023-09-03 NOTE — Progress Notes (Signed)
 WEIGHT SUMMARY AND BIOMETRICS  Vitals Temp: 98.3 F (36.8 C) BP: 114/66 Pulse Rate: 62 SpO2: 94 %   Anthropometric Measurements Height: 5\' 6"  (1.676 m) Weight: 195 lb (88.5 kg) BMI (Calculated): 31.49 Weight at Last Visit: 194 lb Weight Lost Since Last Visit: 0 Weight Gained Since Last Visit: 1 lb Starting Weight: 281 lb Total Weight Loss (lbs): 86 lb (39 kg)   Body Composition  Body Fat %: 41.7 % Fat Mass (lbs): 81.6 lbs Muscle Mass (lbs): 108.4 lbs Total Body Water (lbs): 81.4 lbs Visceral Fat Rating : 10   Other Clinical Data Fasting: yes Labs: yes Today's Visit #: 50 Starting Date: 08/26/17 Comments: fasting labs    Chief Complaint:   OBESITY Wanda Peters is here to discuss her progress with her obesity treatment plan.  She is on the practicing portion control and making smarter food choices, such as increasing vegetables and decreasing simple carbohydrates and states she is following her eating plan approximately 80 % of the time.  She states she is exercising Group Strength Training 60 minutes 3 times per week.   Interim History:  She has been followed by [facility] Last Rx of [facility] sent in Jan 2025- denied by insurance. Discussed pharmacological options to assist with weight loss.  She is on daily oral birth control- 100% compliant She denies family hx of MENS 2 or MTC She denies personal hx of pancreatitis  Hunger/appetite-she endorses significantly increased "food noise" the last several months.  Reviewed Bioimpedance results with pt: Muscle Mass: -0.4 lb Adipose Mass: +1.4 lbs  Subjective:   1. Healthcare maintenance She endorses stable energy levels. She continues to exercise at high intensity at least 3 times weekly.  2. Elevated LDL cholesterol level Lipid Panel     Component Value Date/Time   CHOL 169 08/18/2022 0936   TRIG 96 08/18/2022 0936   HDL 65 08/18/2022 0936   CHOLHDL 2.6 08/18/2022 0936   CHOLHDL 3.4 09/30/2018  0852   VLDL 11 09/30/2018 0852   LDLCALC 87 08/18/2022 0936   LABVLDL 17 08/18/2022 0936    She denies tobacco/vape use She denies CP with exertion  3. Vitamin D deficiency  Latest Reference Range & Units 08/18/22 09:36  Vitamin D, 25-Hydroxy 30.0 - 100.0 ng/mL 57.2   She is on daily OTC Vit D3 2,000 international units   4. Insulin resistance  Latest Reference Range & Units 08/18/22 09:36  Glucose 70 - 99 mg/dL 75  Hemoglobin W0J 4.8 - 5.6 % 5.4  Est. average glucose Bld gHb Est-mCnc mg/dL 811  INSULIN 2.6 - 91.4 uIU/mL 3.4   Last Rx of [facility] sent in Jan 2025- denied by insurance. Discussed pharmacoligcal options to assist with weight loss.  She is on daily oral birth control- 100% compliant She denies family hx of MENS 2 or MTC She denies personal hx of pancreatitis  Hunger/appetite-she endorses significantly increased "food noise" the last several months.  She is agreeable to starting Zepbound vial cash option via [employer]  5. Other depression, with emotional eating  BP stable at OV She reports mood stable, denies SI/HI She is on daily Lexapro 20mg   Assessment/Plan:   1. Healthcare maintenance Check Labs - Comprehensive metabolic panel with GFR - Vitamin B12  2. Elevated LDL cholesterol level (Primary) Check Labs - Lipid panel  3. Vitamin D deficiency Check Labs - VITAMIN D 25 Hydroxy (Vit-D Deficiency, Fractures)  4. Insulin resistance Check Labs - Hemoglobin A1c - Insulin, random  5. Other depression, with emotional eating  Refill escitalopram (LEXAPRO) 20 MG tablet Take 1 tablet (20 mg total) by mouth daily. Dispense: 90 tablet, Refills: 0 ordered   6. Obesity: current BMI 31.6 Start tirzepatide (ZEPBOUND) 2.5 MG/0.5ML injection vial Inject 2.5 mg into the skin once a week. Dispense: 2 mL, Refills: 0 ordered   [name] is currently in the action stage of change. As such, her goal is to continue with weight loss efforts. She has agreed to  practicing portion control and making smarter food choices, such as increasing vegetables and decreasing simple carbohydrates.   Exercise goals: For substantial health benefits, adults should do at least 150 minutes (2 hours and 30 minutes) a week of moderate-intensity, or 75 minutes (1 hour and 15 minutes) a week of vigorous-intensity aerobic physical activity, or an equivalent combination of moderate- and vigorous-intensity aerobic activity. Aerobic activity should be performed in episodes of at least 10 minutes, and preferably, it should be spread throughout the week.  Behavioral modification strategies: increasing lean protein intake, decreasing simple carbohydrates, increasing vegetables, increasing water intake, decreasing eating out, no skipping meals, meal planning and cooking strategies, keeping healthy foods in the home, and planning for success.  Summer has agreed to follow-up with our clinic in 4 weeks. She was informed of the importance of frequent follow-up visits to maximize her success with intensive lifestyle modifications for her multiple health conditions.   [name] was informed we would discuss her lab results at her next visit unless there is a critical issue that needs to be addressed sooner. [name] agreed to keep her next visit at the agreed upon time to discuss these results.  Objective:   Blood pressure 114/66, pulse 62, temperature 98.3 F (36.8 C), height 5\' 6"  (1.676 m), weight 195 lb (88.5 kg), SpO2 94%. Body mass index is 31.47 kg/m.  General: Cooperative, alert, well developed, in no acute distress. HEENT: Conjunctivae and lids unremarkable. Cardiovascular: Regular rhythm.  Lungs: Normal work of breathing. Neurologic: No focal deficits.   Lab Results  Component Value Date   CREATININE 0.85 08/18/2022   BUN 12 08/18/2022   NA 140 08/18/2022   K 4.3 08/18/2022   CL 104 08/18/2022   CO2 22 08/18/2022   Lab Results  Component Value Date   ALT 8  08/18/2022   AST 18 08/18/2022   ALKPHOS 63 08/18/2022   BILITOT 0.3 08/18/2022   Lab Results  Component Value Date   HGBA1C 5.4 08/18/2022   HGBA1C 5.4 04/09/2021   HGBA1C 5.2 04/03/2020   HGBA1C 5.1 05/23/2019   HGBA1C 5.4 09/30/2018   Lab Results  Component Value Date   INSULIN 3.4 08/18/2022   INSULIN 2.5 (L) 04/09/2021   INSULIN 4.1 04/03/2020   INSULIN 7.6 05/23/2019   INSULIN 7.9 04/19/2018   Lab Results  Component Value Date   TSH 4.03 07/14/2017   Lab Results  Component Value Date   CHOL 169 08/18/2022   HDL 65 08/18/2022   LDLCALC 87 08/18/2022   TRIG 96 08/18/2022   CHOLHDL 2.6 08/18/2022   Lab Results  Component Value Date   VD25OH 57.2 08/18/2022   VD25OH 59.3 04/09/2021   VD25OH 63.7 04/03/2020   Lab Results  Component Value Date   WBC 6.5 04/09/2021   HGB 13.2 04/09/2021   HCT 38.2 04/09/2021   MCV 95 04/09/2021   PLT 238 04/09/2021   Lab Results  Component Value Date   IRON 109 04/03/2020   TIBC 268 04/03/2020  FERRITIN 124 07/02/2021   Attestation Statements:   Reviewed by clinician on day of visit: allergies, medications, problem list, medical history, surgical history, family history, social history, and previous encounter notes.  I have reviewed the above documentation for accuracy and completeness, and I agree with the above. -  Lucas Winograd d. Zaedyn Covin, NP-C

## 2023-09-05 LAB — COMPREHENSIVE METABOLIC PANEL WITH GFR
ALT: 11 IU/L (ref 0–32)
AST: 16 IU/L (ref 0–40)
Albumin: 4.1 g/dL (ref 3.9–4.9)
Alkaline Phosphatase: 58 IU/L (ref 44–121)
BUN/Creatinine Ratio: 24 — ABNORMAL HIGH (ref 9–23)
BUN: 19 mg/dL (ref 6–24)
Bilirubin Total: 0.4 mg/dL (ref 0.0–1.2)
CO2: 21 mmol/L (ref 20–29)
Calcium: 9 mg/dL (ref 8.7–10.2)
Chloride: 100 mmol/L (ref 96–106)
Creatinine, Ser: 0.79 mg/dL (ref 0.57–1.00)
Globulin, Total: 2.2 g/dL (ref 1.5–4.5)
Glucose: 75 mg/dL (ref 70–99)
Potassium: 4.3 mmol/L (ref 3.5–5.2)
Sodium: 137 mmol/L (ref 134–144)
Total Protein: 6.3 g/dL (ref 6.0–8.5)
eGFR: 92 mL/min/{1.73_m2} (ref >59)

## 2023-09-05 LAB — LIPID PANEL
Chol/HDL Ratio: 2.8 ratio (ref 0.0–4.4)
Cholesterol, Total: 184 mg/dL (ref 100–199)
HDL: 66 mg/dL (ref >39)
LDL Chol Calc (NIH): 104 mg/dL — ABNORMAL HIGH (ref 0–99)
Triglycerides: 76 mg/dL (ref 0–149)
VLDL Cholesterol Cal: 14 mg/dL (ref 5–40)

## 2023-09-05 LAB — VITAMIN B12: Vitamin B-12: 510 pg/mL (ref 232–1245)

## 2023-09-05 LAB — HEMOGLOBIN A1C
Est. average glucose Bld gHb Est-mCnc: 100 mg/dL
Hgb A1c MFr Bld: 5.1 % (ref 4.8–5.6)

## 2023-09-05 LAB — VITAMIN D 25 HYDROXY (VIT D DEFICIENCY, FRACTURES): Vit D, 25-Hydroxy: 67.2 ng/mL (ref 30.0–100.0)

## 2023-09-05 LAB — INSULIN, RANDOM: INSULIN: 1.8 u[IU]/mL — ABNORMAL LOW (ref 2.6–24.9)

## 2023-09-07 ENCOUNTER — Other Ambulatory Visit: Payer: Self-pay

## 2023-09-07 ENCOUNTER — Other Ambulatory Visit (INDEPENDENT_AMBULATORY_CARE_PROVIDER_SITE_OTHER): Payer: Self-pay | Admitting: Adult Health

## 2023-09-07 ENCOUNTER — Encounter (INDEPENDENT_AMBULATORY_CARE_PROVIDER_SITE_OTHER): Payer: Self-pay | Admitting: Adult Health

## 2023-09-07 ENCOUNTER — Other Ambulatory Visit (INDEPENDENT_AMBULATORY_CARE_PROVIDER_SITE_OTHER): Payer: Self-pay

## 2023-09-07 DIAGNOSIS — F3289 Other specified depressive episodes: Secondary | ICD-10-CM

## 2023-09-07 MED ORDER — ESCITALOPRAM OXALATE 20 MG PO TABS
20.0000 mg | ORAL_TABLET | Freq: Every day | ORAL | 0 refills | Status: DC
Start: 2023-09-07 — End: 2023-12-23
  Filled 2023-09-07 – 2023-10-15 (×3): qty 90, 90d supply, fill #0

## 2023-09-07 NOTE — Telephone Encounter (Signed)
Ok to send to [facility]? 

## 2023-09-09 ENCOUNTER — Telehealth (INDEPENDENT_AMBULATORY_CARE_PROVIDER_SITE_OTHER): Payer: Self-pay | Admitting: *Deleted

## 2023-09-09 NOTE — Telephone Encounter (Signed)
 Called patient, no answer, left a message to call us back if she did not receive her Zepbound. I had called the pharmacy, could not reach anyone. I did leave a message for patient and at pharmacy to call back if she has not receive yet. The pharmacy and they have not received the script, said [facility] is not were it should be sent but to [facility]. Resent the prescription to [facility] Self pay as requested by the pharmacy, updated the patient as well.

## 2023-09-10 ENCOUNTER — Other Ambulatory Visit (INDEPENDENT_AMBULATORY_CARE_PROVIDER_SITE_OTHER): Payer: Self-pay | Admitting: *Deleted

## 2023-09-10 MED ORDER — TIRZEPATIDE-WEIGHT MANAGEMENT 2.5 MG/0.5ML ~~LOC~~ SOLN
2.5000 mg | SUBCUTANEOUS | 0 refills | Status: DC
Start: 2023-09-10 — End: 2023-09-21

## 2023-09-13 ENCOUNTER — Encounter (INDEPENDENT_AMBULATORY_CARE_PROVIDER_SITE_OTHER): Payer: Self-pay | Admitting: Adult Health

## 2023-09-21 ENCOUNTER — Ambulatory Visit (INDEPENDENT_AMBULATORY_CARE_PROVIDER_SITE_OTHER): Admitting: Adult Health

## 2023-09-21 ENCOUNTER — Encounter (INDEPENDENT_AMBULATORY_CARE_PROVIDER_SITE_OTHER): Payer: Self-pay | Admitting: Adult Health

## 2023-09-21 VITALS — BP 110/70 | HR 61 | Temp 98.4°F | Ht 66.0 in | Wt 192.0 lb

## 2023-09-21 DIAGNOSIS — E7800 Pure hypercholesterolemia, unspecified: Secondary | ICD-10-CM

## 2023-09-21 DIAGNOSIS — E669 Obesity, unspecified: Secondary | ICD-10-CM

## 2023-09-21 DIAGNOSIS — E88819 Insulin resistance, unspecified: Secondary | ICD-10-CM

## 2023-09-21 DIAGNOSIS — E559 Vitamin D deficiency, unspecified: Secondary | ICD-10-CM

## 2023-09-21 DIAGNOSIS — Z6831 Body mass index (BMI) 31.0-31.9, adult: Secondary | ICD-10-CM | POA: Diagnosis not present

## 2023-09-21 DIAGNOSIS — Z0000 Encounter for general adult medical examination without abnormal findings: Secondary | ICD-10-CM

## 2023-09-21 MED ORDER — TIRZEPATIDE-WEIGHT MANAGEMENT 2.5 MG/0.5ML ~~LOC~~ SOLN: 2.5000 mg | SUBCUTANEOUS | 0 refills | Status: DC

## 2023-09-21 NOTE — Progress Notes (Signed)
 WEIGHT SUMMARY AND BIOMETRICS  Vitals Temp: 98.4 F (36.9 C) BP: 110/70 Pulse Rate: 61 SpO2: 99 %   Anthropometric Measurements Height: 5\' 6"  (1.676 m) Weight: 192 lb (87.1 kg) BMI (Calculated): 31 Weight at Last Visit: 195 lb Weight Lost Since Last Visit: 3 lb Weight Gained Since Last Visit: 0 Starting Weight: 281 lb Total Weight Loss (lbs): 89 lb (40.4 kg)   Body Composition  Body Fat %: 40 % Fat Mass (lbs): 77 lbs Muscle Mass (lbs): 109.8 lbs Total Body Water (lbs): 78 lbs Visceral Fat Rating : 9   Other Clinical Data Fasting: no Labs: no Today's Visit #: 51 Starting Date: 08/26/17    Chief Complaint:   OBESITY Wanda Peters is here to discuss her progress with her obesity treatment plan.  She is on the practicing portion control and making smarter food choices, such as increasing vegetables and decreasing simple carbohydrates and states she is following her eating plan approximately 100 % of the time.  She states she is exercising Strength Training 45 minutes 3 times per week.   Interim History:  Recently started on weekly Zepbound 2.5 mg (Vials per [employer] Pay Option) She has had 2 injections of the loading dose, injects on Saturdays Denies mass in neck, dysphagia, dyspepsia, persistent hoarseness, abdominal pain, or N/V/C   Calorie range 640 543 1407/day, at least 120g protein/day   Exercise-Strength Training at leat 3 x week, 45 mins in duration  Reviewed Bioimpedance results with pt: Muscle Mass: +1.4 lbs Adipose Mass: -4.6 lbs  Current weight 192 lbs, goal weight 170-175 lbs  Of note- Daily LoLoestrin Fe She reports 100% compliance with oral BCP  Subjective:   1. Elevated LDL cholesterol level Discussed Labs Lipid Panel     Component Value Date/Time   CHOL 184 09/03/2023 0927   TRIG 76 09/03/2023 0927   HDL 66 09/03/2023 0927   CHOLHDL 2.8 09/03/2023 0927   CHOLHDL 3.4 09/30/2018 0852   VLDL 11 09/30/2018 0852   LDLCALC 104 (H)  09/03/2023 0927   LABVLDL 14 09/03/2023 0927    The 10-year ASCVD risk score (Arnett DK, et al., 2019) is: 0.6%   Values used to calculate the score:     Age: 5 years     Sex: Female     Is Non-Hispanic African American: No     Diabetic: No     Tobacco smoker: No     Systolic Blood Pressure: 110 mmHg     Is BP treated: No     HDL Cholesterol: 66 mg/dL     Total Cholesterol: 184 mg/dL   LDL only slightly above goal of 100 All other values- normal She is not on statin therapy  2. Vitamin D deficiency Discussed Labs  Latest Reference Range & Units 09/03/23 09:27  Vitamin D, 25-Hydroxy 30.0 - 100.0 ng/mL 67.2       Vit D level stable and at goal She is on daily OTC Bariatric MVI and daily OTC Vit D3 1,000 international units  She endorses stable energy levels  3. Insulin resistance Discussed Labs  Latest Reference Range & Units 09/03/23 09:27  Glucose 70 - 99 mg/dL 75  Hemoglobin Z6X 4.8 - 5.6 % 5.1  Est. average glucose Bld gHb Est-mCnc mg/dL 096  INSULIN 2.6 - 04.5 uIU/mL 1.8 (L)  (L): Data is abnormally low  CBG, A1c- normal and at goal Insulin level below normal She reports long standing sx's of hypoglycemia- secondary to not consuming calories for long  periods She has had 2 doses of Zepbound 2.32m Denies mass in neck, dysphagia, dyspepsia, persistent hoarseness, abdominal pain, or N/V/C    4. Healthcare maintenance Discussed Labs Vitamin B12 232 - 1,245 pg/mL 510  CMP- electrolytes, kidney fx, and liver enzymes- normal  Assessment/Plan:   1. Elevated LDL cholesterol level Continue healthy eating and regular exercise Continue weekly low dose Zepbound  2. Vitamin D deficiency (Primary) Continue OTC Supplementation  3. Insulin resistance Continue healthy eating and regular exercise Continue weekly low dose Zepbound  4. Healthcare maintenance Continue healthy eating and regular exercise Continue weekly low dose Zepbound  5. Obesity: current BMI  31.1 Refill tirzepatide (ZEPBOUND) 2.5 MG/0.5ML injection vial Inject 2.5 mg into the skin once a week. Dispense: 2 mL, Refills: 0 ordered   [name] is currently in the action stage of change. As such, her goal is to continue with weight loss efforts. She has agreed to practicing portion control and making smarter food choices, such as increasing vegetables and decreasing simple carbohydrates.   Exercise goals: For substantial health benefits, adults should do at least 150 minutes (2 hours and 30 minutes) a week of moderate-intensity, or 75 minutes (1 hour and 15 minutes) a week of vigorous-intensity aerobic physical activity, or an equivalent combination of moderate- and vigorous-intensity aerobic activity. Aerobic activity should be performed in episodes of at least 10 minutes, and preferably, it should be spread throughout the week.  Behavioral modification strategies: increasing lean protein intake, decreasing simple carbohydrates, increasing vegetables, increasing water intake, meal planning and cooking strategies, keeping healthy foods in the home, ways to avoid boredom eating, and planning for success.  [name] has agreed to follow-up with our clinic in 4 weeks. She was informed of the importance of frequent follow-up visits to maximize her success with intensive lifestyle modifications for her multiple health conditions.   Objective:   Blood pressure 110/70, pulse 61, temperature 98.4 F (36.9 C), height 5\' 6"  (1.676 m), weight 192 lb (87.1 kg), SpO2 99%. Body mass index is 30.99 kg/m.  General: Cooperative, alert, well developed, in no acute distress. HEENT: Conjunctivae and lids unremarkable. Cardiovascular: Regular rhythm.  Lungs: Normal work of breathing. Neurologic: No focal deficits.   Lab Results  Component Value Date   CREATININE 0.79 09/03/2023   BUN 19 09/03/2023   NA 137 09/03/2023   K 4.3 09/03/2023   CL 100 09/03/2023   CO2 21 09/03/2023   Lab Results   Component Value Date   ALT 11 09/03/2023   AST 16 09/03/2023   ALKPHOS 58 09/03/2023   BILITOT 0.4 09/03/2023   Lab Results  Component Value Date   HGBA1C 5.1 09/03/2023   HGBA1C 5.4 08/18/2022   HGBA1C 5.4 04/09/2021   HGBA1C 5.2 04/03/2020   HGBA1C 5.1 05/23/2019   Lab Results  Component Value Date   INSULIN 1.8 (L) 09/03/2023   INSULIN 3.4 08/18/2022   INSULIN 2.5 (L) 04/09/2021   INSULIN 4.1 04/03/2020   INSULIN 7.6 05/23/2019   Lab Results  Component Value Date   TSH 4.03 07/14/2017   Lab Results  Component Value Date   CHOL 184 09/03/2023   HDL 66 09/03/2023   LDLCALC 104 (H) 09/03/2023   TRIG 76 09/03/2023   CHOLHDL 2.8 09/03/2023   Lab Results  Component Value Date   VD25OH 67.2 09/03/2023   VD25OH 57.2 08/18/2022   VD25OH 59.3 04/09/2021   Lab Results  Component Value Date   WBC 6.5 04/09/2021   HGB 13.2 04/09/2021  HCT 38.2 04/09/2021   MCV 95 04/09/2021   PLT 238 04/09/2021   Lab Results  Component Value Date   IRON 109 04/03/2020   TIBC 268 04/03/2020   FERRITIN 124 07/02/2021   Attestation Statements:   Reviewed by clinician on day of visit: allergies, medications, problem list, medical history, surgical history, family history, social history, and previous encounter notes.  I have reviewed the above documentation for accuracy and completeness, and I agree with the above. -  Orlie Cundari d. Cornelious Diven, NP-C

## 2023-10-05 ENCOUNTER — Other Ambulatory Visit (HOSPITAL_COMMUNITY): Payer: Self-pay

## 2023-10-06 ENCOUNTER — Encounter: Payer: Self-pay | Admitting: Pharmacist

## 2023-10-06 ENCOUNTER — Other Ambulatory Visit: Payer: Self-pay

## 2023-10-06 DIAGNOSIS — H04123 Dry eye syndrome of bilateral lacrimal glands: Secondary | ICD-10-CM | POA: Diagnosis not present

## 2023-10-09 ENCOUNTER — Other Ambulatory Visit: Payer: Self-pay

## 2023-10-12 ENCOUNTER — Other Ambulatory Visit (HOSPITAL_COMMUNITY): Payer: Self-pay

## 2023-10-13 ENCOUNTER — Other Ambulatory Visit (HOSPITAL_COMMUNITY): Payer: Self-pay

## 2023-10-15 ENCOUNTER — Other Ambulatory Visit: Payer: Self-pay

## 2023-10-15 ENCOUNTER — Other Ambulatory Visit (INDEPENDENT_AMBULATORY_CARE_PROVIDER_SITE_OTHER): Payer: Self-pay | Admitting: Adult Health

## 2023-10-16 ENCOUNTER — Other Ambulatory Visit (INDEPENDENT_AMBULATORY_CARE_PROVIDER_SITE_OTHER): Payer: Self-pay | Admitting: Adult Health

## 2023-11-03 ENCOUNTER — Encounter (INDEPENDENT_AMBULATORY_CARE_PROVIDER_SITE_OTHER): Payer: Self-pay | Admitting: Adult Health

## 2023-11-03 ENCOUNTER — Ambulatory Visit (INDEPENDENT_AMBULATORY_CARE_PROVIDER_SITE_OTHER): Admitting: Adult Health

## 2023-11-03 VITALS — BP 114/69 | HR 61 | Temp 97.8°F | Ht 66.0 in | Wt 186.0 lb

## 2023-11-03 DIAGNOSIS — E669 Obesity, unspecified: Secondary | ICD-10-CM | POA: Diagnosis not present

## 2023-11-03 DIAGNOSIS — E88819 Insulin resistance, unspecified: Secondary | ICD-10-CM

## 2023-11-03 DIAGNOSIS — E66813 Obesity, class 3: Secondary | ICD-10-CM

## 2023-11-03 DIAGNOSIS — E7800 Pure hypercholesterolemia, unspecified: Secondary | ICD-10-CM

## 2023-11-03 DIAGNOSIS — Z6830 Body mass index (BMI) 30.0-30.9, adult: Secondary | ICD-10-CM

## 2023-11-03 DIAGNOSIS — E559 Vitamin D deficiency, unspecified: Secondary | ICD-10-CM

## 2023-11-03 MED ORDER — TIRZEPATIDE-WEIGHT MANAGEMENT 2.5 MG/0.5ML ~~LOC~~ SOLN: 2.5000 mg | SUBCUTANEOUS | 1 refills | Status: DC

## 2023-11-03 MED ORDER — TIRZEPATIDE-WEIGHT MANAGEMENT 2.5 MG/0.5ML ~~LOC~~ SOLN: 2.5000 mg | SUBCUTANEOUS | 0 refills | Status: DC

## 2023-11-03 NOTE — Progress Notes (Signed)
 WEIGHT SUMMARY AND BIOMETRICS  Vitals Temp: 97.8 F (36.6 C) BP: 114/69 Pulse Rate: 61 SpO2: 100 %   Anthropometric Measurements Height: 5\' 6"  (1.676 m) Weight: 186 lb (84.4 kg) BMI (Calculated): 30.04 Weight at Last Visit: 192 lb Weight Lost Since Last Visit: 6 lb Weight Gained Since Last Visit: 0 Starting Weight: 281 lb Total Weight Loss (lbs): 95 lb (43.1 kg)   Body Composition  Body Fat %: 39.3 % Fat Mass (lbs): 73 lbs Muscle Mass (lbs): 107.2 lbs Total Body Water  (lbs): 78.2 lbs Visceral Fat Rating : 9   Other Clinical Data Fasting: no Labs: no Today's Visit #: 52 Starting Date: 08/26/17    Chief Complaint:   OBESITY Wanda Peters is here to discuss her progress with her obesity treatment plan.  She is on the practicing portion control and making smarter food choices, such as increasing vegetables and decreasing simple carbohydrates and states she is following her eating plan approximately 100 % of the time.  She states she is exercising [employer] (with personal trainer) 60 minutes 3 times per week.  Interim History:  Current weight 186 lbs with corresponding BMI 30.0 Goal weight 175 lbs with corresponding BMI 28.2  Reviewed Bioimpedance results with pt: Muscle Mass: -2.6 lbs Adipose Mass: -4 lbs  Visceral Adipose Rating 9  She started loading dose Zepbound 2.5mg  on.about 09/03/2023 She is utilizing the [employer] Option to secure Rx Denies mass in neck, dysphagia, dyspepsia, persistent hoarseness, abdominal pain, or N/V/C   Subjective:   1. Elevated LDL cholesterol level Lipid Panel     Component Value Date/Time   CHOL 184 09/03/2023 0927   TRIG 76 09/03/2023 0927   HDL 66 09/03/2023 0927   CHOLHDL 2.8 09/03/2023 0927   CHOLHDL 3.4 09/30/2018 0852   VLDL 11 09/30/2018 0852   LDLCALC 104 (H) 09/03/2023 0927   LABVLDL 14 09/03/2023 0927    She continues to exercise at high intensity with a trainer 3 x week  2. Vitamin D   deficiency  Latest Reference Range & Units 04/09/21 09:56 08/18/22 09:36 09/03/23 09:27  Vitamin D , 25-Hydroxy 30.0 - 100.0 ng/mL 59.3 57.2 67.2   She endorses stable energy levels  3. Insulin  resistance  Latest Reference Range & Units 04/09/21 09:56 08/18/22 09:36 09/03/23 09:27  INSULIN  2.6 - 24.9 uIU/mL 2.5 (L) 3.4 1.8 (L)  (L): Data is abnormally low Lab Results  Component Value Date   HGBA1C 5.1 09/03/2023   HGBA1C 5.4 08/18/2022   HGBA1C 5.4 04/09/2021    She started loading dose Zepbound 2.5mg  on.about 09/03/2023 She is utilizing the [employer] Option to secure Rx Denies mass in neck, dysphagia, dyspepsia, persistent hoarseness, abdominal pain, or N/V/C   Assessment/Plan:   1. Elevated LDL cholesterol level (Primary) Continue regular exercise Monitor Labs  2. Vitamin D  deficiency Monitor Labs  3. Insulin  resistance Refill tirzepatide (ZEPBOUND) 2.5 MG/0.5ML injection vial Inject 2.5 mg into the skin once a week. Dispense: 2 mL, Refills: 1 ordered   4. Obesity: current BMI 30.0 Refill tirzepatide (ZEPBOUND) 2.5 MG/0.5ML injection vial Inject 2.5 mg into the skin once a week. Dispense: 2 mL, Refills: 1 ordered   Iceis is currently in the action stage of change. As such, her goal is to continue with weight loss efforts. She has agreed to practicing portion control and making smarter food choices, such as increasing vegetables and decreasing simple carbohydrates.   Exercise goals: For substantial health benefits, adults should do at  least 150 minutes (2 hours and 30 minutes) a week of moderate-intensity, or 75 minutes (1 hour and 15 minutes) a week of vigorous-intensity aerobic physical activity, or an equivalent combination of moderate- and vigorous-intensity aerobic activity. Aerobic activity should be performed in episodes of at least 10 minutes, and preferably, it should be spread throughout the week.  Behavioral modification strategies: increasing lean protein  intake, decreasing simple carbohydrates, increasing vegetables, no skipping meals, meal planning and cooking strategies, keeping healthy foods in the home, and planning for success.  Levie has agreed to follow-up with our clinic in 6 weeks. She was informed of the importance of frequent follow-up visits to maximize her success with intensive lifestyle modifications for her multiple health conditions.   Consider Mx Phase this Summer 2025  Objective:   Blood pressure 114/69, pulse 61, temperature 97.8 F (36.6 C), height 5\' 6"  (1.676 m), weight 186 lb (84.4 kg), SpO2 100%. Body mass index is 30.02 kg/m.  General: Cooperative, alert, well developed, in no acute distress. HEENT: Conjunctivae and lids unremarkable. Cardiovascular: Regular rhythm.  Lungs: Normal work of breathing. Neurologic: No focal deficits.   Lab Results  Component Value Date   CREATININE 0.79 09/03/2023   BUN 19 09/03/2023   NA 137 09/03/2023   K 4.3 09/03/2023   CL 100 09/03/2023   CO2 21 09/03/2023   Lab Results  Component Value Date   ALT 11 09/03/2023   AST 16 09/03/2023   ALKPHOS 58 09/03/2023   BILITOT 0.4 09/03/2023   Lab Results  Component Value Date   HGBA1C 5.1 09/03/2023   HGBA1C 5.4 08/18/2022   HGBA1C 5.4 04/09/2021   HGBA1C 5.2 04/03/2020   HGBA1C 5.1 05/23/2019   Lab Results  Component Value Date   INSULIN  1.8 (L) 09/03/2023   INSULIN  3.4 08/18/2022   INSULIN  2.5 (L) 04/09/2021   INSULIN  4.1 04/03/2020   INSULIN  7.6 05/23/2019   Lab Results  Component Value Date   TSH 4.03 07/14/2017   Lab Results  Component Value Date   CHOL 184 09/03/2023   HDL 66 09/03/2023   LDLCALC 104 (H) 09/03/2023   TRIG 76 09/03/2023   CHOLHDL 2.8 09/03/2023   Lab Results  Component Value Date   VD25OH 67.2 09/03/2023   VD25OH 57.2 08/18/2022   VD25OH 59.3 04/09/2021   Lab Results  Component Value Date   WBC 6.5 04/09/2021   HGB 13.2 04/09/2021   HCT 38.2 04/09/2021   MCV 95  04/09/2021   PLT 238 04/09/2021   Lab Results  Component Value Date   IRON 109 04/03/2020   TIBC 268 04/03/2020   FERRITIN 124 07/02/2021   Attestation Statements:   Reviewed by clinician on day of visit: allergies, medications, problem list, medical history, surgical history, family history, social history, and previous encounter notes.  I have reviewed the above documentation for accuracy and completeness, and I agree with the above. -  Aleiyah Halpin d. Latonja Bobeck, NP-C

## 2023-12-23 ENCOUNTER — Encounter (INDEPENDENT_AMBULATORY_CARE_PROVIDER_SITE_OTHER): Payer: Self-pay | Admitting: Adult Health

## 2023-12-23 ENCOUNTER — Other Ambulatory Visit: Payer: Self-pay

## 2023-12-23 ENCOUNTER — Ambulatory Visit (INDEPENDENT_AMBULATORY_CARE_PROVIDER_SITE_OTHER): Admitting: Adult Health

## 2023-12-23 VITALS — BP 96/62 | HR 63 | Temp 98.5°F | Ht 66.0 in | Wt 183.0 lb

## 2023-12-23 DIAGNOSIS — Z6829 Body mass index (BMI) 29.0-29.9, adult: Secondary | ICD-10-CM | POA: Diagnosis not present

## 2023-12-23 DIAGNOSIS — F3289 Other specified depressive episodes: Secondary | ICD-10-CM

## 2023-12-23 DIAGNOSIS — E559 Vitamin D deficiency, unspecified: Secondary | ICD-10-CM

## 2023-12-23 DIAGNOSIS — E7800 Pure hypercholesterolemia, unspecified: Secondary | ICD-10-CM

## 2023-12-23 DIAGNOSIS — E669 Obesity, unspecified: Secondary | ICD-10-CM | POA: Diagnosis not present

## 2023-12-23 DIAGNOSIS — E88819 Insulin resistance, unspecified: Secondary | ICD-10-CM

## 2023-12-23 DIAGNOSIS — E66813 Obesity, class 3: Secondary | ICD-10-CM

## 2023-12-23 MED ORDER — TIRZEPATIDE-WEIGHT MANAGEMENT 2.5 MG/0.5ML ~~LOC~~ SOLN: 2.5000 mg | SUBCUTANEOUS | 3 refills | Status: DC

## 2023-12-23 MED ORDER — TIRZEPATIDE-WEIGHT MANAGEMENT 2.5 MG/0.5ML ~~LOC~~ SOLN: 2.5000 mg | SUBCUTANEOUS | 1 refills | Status: DC

## 2023-12-23 MED ORDER — ESCITALOPRAM OXALATE 20 MG PO TABS
20.0000 mg | ORAL_TABLET | Freq: Every day | ORAL | 0 refills | Status: DC
  Filled 2023-12-23 – 2024-01-12 (×2): qty 90, 90d supply, fill #0

## 2023-12-23 NOTE — Progress Notes (Signed)
 WEIGHT SUMMARY AND BIOMETRICS  Vitals Temp: 98.5 F (36.9 C) BP: 96/62 Pulse Rate: 63 SpO2: 100 %   Anthropometric Measurements Height: 5' 6 (1.676 m) Weight: 183 lb (83 kg) BMI (Calculated): 29.55 Weight at Last Visit: 186 lb Weight Lost Since Last Visit: 3 lb Weight Gained Since Last Visit: 0 Starting Weight: 281 lb Total Weight Loss (lbs): 98 lb (44.5 kg)   Body Composition  Body Fat %: 39.5 % Fat Mass (lbs): 72.6 lbs Muscle Mass (lbs): 105.6 lbs Total Body Water  (lbs): 81 lbs Visceral Fat Rating : 9   Other Clinical Data Fasting: no Labs: no Today's Visit #: 66 Starting Date: 08/26/17    Chief Complaint:   OBESITY Wanda Peters is here to discuss her progress with her obesity treatment plan.  She is on the practicing portion control and making smarter food choices, such as increasing vegetables and decreasing simple carbohydrates and states she is following her eating plan approximately 100 % of the time.  She states she is exercising Personal Trainer 60 minutes 3 times per week.  Interim History:  She started loading dose Zepbound  2.5mg  on.about 09/03/2023 She has been spacing out injections every 10 days She is utilizing the [employer] Option to secure Rx Denies mass in neck, dysphagia, dyspepsia, persistent hoarseness, abdominal pain, or N/V/C   Exercise-Personal Trainer 60 minutes 3 times per week Hydration-She estimates to drinks 90-120 oz water /day  Subjective:   1. Elevated LDL cholesterol level Lipid Panel     Component Value Date/Time   CHOL 184 09/03/2023 0927   TRIG 76 09/03/2023 0927   HDL 66 09/03/2023 0927   CHOLHDL 2.8 09/03/2023 0927   CHOLHDL 3.4 09/30/2018 0852   VLDL 11 09/30/2018 0852   LDLCALC 104 (H) 09/03/2023 0927   LABVLDL 14 09/03/2023 0927    She is not on statin therapy She is on GIP/GLP-1 therapy She continues to exercise at high intensity  2. Vitamin D  deficiency She endorses stable energy levels   Latest Reference Range & Units 04/09/21 09:56 08/18/22 09:36 09/03/23 09:27  Vitamin D , 25-Hydroxy 30.0 - 100.0 ng/mL 59.3 57.2 67.2   3. Insulin  resistance She started loading dose Zepbound  2.5mg  on.about 09/03/2023 She has been spacing out injections every 10 days She is utilizing the [employer] Option to secure Rx Denies mass in neck, dysphagia, dyspepsia, persistent hoarseness, abdominal pain, or N/V/C   4. Other depression She endorses stable mood, denies SI/HI She continues to exercise at high intensity She is on daily Lexapro  20mg    Assessment/Plan:   1. Elevated LDL cholesterol level (Primary) Continue healthy eating and regular exercise Check Fasting Labs at next OV  2. Vitamin D  deficiency Check Fasting Labs at next OV  3. Insulin  resistance Continue healthy eating and regular exercise Check Fasting Labs at next OV  4. Other depression Refill  escitalopram  (LEXAPRO ) 20 MG tablet Take 1 tablet (20 mg total) by mouth daily. Dispense: 90 tablet, Refills: 0 of 0 remaining   5. Obesity: current BMI 29.7 Refill  tirzepatide  (ZEPBOUND ) 2.5 MG/0.5ML injection vial Inject 2.5 mg into the skin once a week. Dispense: 2 mL, Refills: 3 ordered   Wanda Peters is currently in the action stage of change. As such, her goal is to continue with weight loss efforts. She has agreed to practicing portion control and making smarter food choices, such as increasing vegetables and decreasing simple carbohydrates.   Exercise goals: For substantial health benefits, adults should do at  least 150 minutes (2 hours and 30 minutes) a week of moderate-intensity, or 75 minutes (1 hour and 15 minutes) a week of vigorous-intensity aerobic physical activity, or an equivalent combination of moderate- and vigorous-intensity aerobic activity. Aerobic activity should be performed in episodes of at least 10 minutes, and preferably, it should be spread throughout the week.  Behavioral modification strategies:  increasing lean protein intake, decreasing simple carbohydrates, increasing vegetables, increasing water  intake, meal planning and cooking strategies, keeping healthy foods in the home, and planning for success.  Wanda Peters has agreed to follow-up with our clinic in 12 weeks. She was informed of the importance of frequent follow-up visits to maximize her success with intensive lifestyle modifications for her multiple health conditions.   Check Fasting Labs at next OV  Objective:   Blood pressure 96/62, pulse 63, temperature 98.5 F (36.9 C), height 5' 6 (1.676 m), weight 183 lb (83 kg), last menstrual period 12/06/2023, SpO2 100%. Body mass index is 29.54 kg/m.  General: Cooperative, alert, well developed, in no acute distress. HEENT: Conjunctivae and lids unremarkable. Cardiovascular: Regular rhythm.  Lungs: Normal work of breathing. Neurologic: No focal deficits.   Lab Results  Component Value Date   CREATININE 0.79 09/03/2023   BUN 19 09/03/2023   NA 137 09/03/2023   K 4.3 09/03/2023   CL 100 09/03/2023   CO2 21 09/03/2023   Lab Results  Component Value Date   ALT 11 09/03/2023   AST 16 09/03/2023   ALKPHOS 58 09/03/2023   BILITOT 0.4 09/03/2023   Lab Results  Component Value Date   HGBA1C 5.1 09/03/2023   HGBA1C 5.4 08/18/2022   HGBA1C 5.4 04/09/2021   HGBA1C 5.2 04/03/2020   HGBA1C 5.1 05/23/2019   Lab Results  Component Value Date   INSULIN  1.8 (L) 09/03/2023   INSULIN  3.4 08/18/2022   INSULIN  2.5 (L) 04/09/2021   INSULIN  4.1 04/03/2020   INSULIN  7.6 05/23/2019   Lab Results  Component Value Date   TSH 4.03 07/14/2017   Lab Results  Component Value Date   CHOL 184 09/03/2023   HDL 66 09/03/2023   LDLCALC 104 (H) 09/03/2023   TRIG 76 09/03/2023   CHOLHDL 2.8 09/03/2023   Lab Results  Component Value Date   VD25OH 67.2 09/03/2023   VD25OH 57.2 08/18/2022   VD25OH 59.3 04/09/2021   Lab Results  Component Value Date   WBC 6.5 04/09/2021   HGB  13.2 04/09/2021   HCT 38.2 04/09/2021   MCV 95 04/09/2021   PLT 238 04/09/2021   Lab Results  Component Value Date   IRON 109 04/03/2020   TIBC 268 04/03/2020   FERRITIN 124 07/02/2021   Attestation Statements:   Reviewed by clinician on day of visit: allergies, medications, problem list, medical history, surgical history, family history, social history, and previous encounter notes.  I have reviewed the above documentation for accuracy and completeness, and I agree with the above. -  Jossilyn Benda d. Marciano Mundt, NP-C

## 2024-01-12 ENCOUNTER — Other Ambulatory Visit: Payer: Self-pay

## 2024-01-13 ENCOUNTER — Other Ambulatory Visit (INDEPENDENT_AMBULATORY_CARE_PROVIDER_SITE_OTHER): Payer: Self-pay | Admitting: Adult Health

## 2024-01-25 ENCOUNTER — Other Ambulatory Visit: Payer: Self-pay

## 2024-01-25 ENCOUNTER — Telehealth: Admitting: Physician Assistant

## 2024-01-25 DIAGNOSIS — S39012A Strain of muscle, fascia and tendon of lower back, initial encounter: Secondary | ICD-10-CM | POA: Diagnosis not present

## 2024-01-25 DIAGNOSIS — M545 Low back pain, unspecified: Secondary | ICD-10-CM

## 2024-01-25 MED ORDER — METHYLPREDNISOLONE 4 MG PO TBPK
ORAL_TABLET | ORAL | 0 refills | Status: DC
  Filled 2024-01-25: qty 21, 6d supply, fill #0

## 2024-01-25 MED ORDER — CYCLOBENZAPRINE HCL 10 MG PO TABS
5.0000 mg | ORAL_TABLET | Freq: Three times a day (TID) | ORAL | 0 refills | Status: DC | PRN
  Filled 2024-01-25: qty 30, 10d supply, fill #0

## 2024-01-25 NOTE — Progress Notes (Signed)
 E-Visit for Back Pain   We are sorry that you are not feeling well.  Here is how we plan to help!  Based on what you have shared with me it looks like you mostly have acute back pain.  Acute back pain is defined as musculoskeletal pain that can resolve in 1-3 weeks with conservative treatment.  I have prescribed Medrol dose pack, a steroid anti-inflammatory, as well as Flexeril 10 mg every eight hours as needed which is a muscle relaxer  Some patients experience stomach irritation or in increased heartburn with anti-inflammatory drugs.  Please keep in mind that muscle relaxer's can cause fatigue and should not be taken while at work or driving.  Back pain is very common.  The pain often gets better over time.  The cause of back pain is usually not dangerous.  Most people can learn to manage their back pain on their own.  Home Care Stay active.  Start with short walks on flat ground if you can.  Try to walk farther each day. Do not sit, drive or stand in one place for more than 30 minutes.  Do not stay in bed. Do not avoid exercise or work.  Activity can help your back heal faster. Be careful when you bend or lift an object.  Bend at your knees, keep the object close to you, and do not twist. Sleep on a firm mattress.  Lie on your side, and bend your knees.  If you lie on your back, put a pillow under your knees. Only take medicines as told by your doctor. Put ice on the injured area. Put ice in a plastic bag Place a towel between your skin and the bag Leave the ice on for 15-20 minutes, 3-4 times a day for the first 2-3 days. 210 After that, you can switch between ice and heat packs. Ask your doctor about back exercises or massage. Avoid feeling anxious or stressed.  Find good ways to deal with stress, such as exercise.  Get Help Right Way If: Your pain does not go away with rest or medicine. Your pain does not go away in 1 week. You have new problems. You do not feel well. The pain  spreads into your legs. You cannot control when you poop (bowel movement) or pee (urinate) You feel sick to your stomach (nauseous) or throw up (vomit) You have belly (abdominal) pain. You feel like you may pass out (faint). If you develop a fever.  Make Sure you: Understand these instructions. Will watch your condition Will get help right away if you are not doing well or get worse.  Your e-visit answers were reviewed by a board certified advanced clinical practitioner to complete your personal care plan.  Depending on the condition, your plan could have included both over the counter or prescription medications.  If there is a problem please reply  once you have received a response from your provider.  Your safety is important to [name].  If you have drug allergies check your prescription carefully.    You can use MyChart to ask questions about today's visit, request a non-urgent call back, or ask for a work or school excuse for 24 hours related to this e-Visit. If it has been greater than 24 hours you will need to follow up with your provider, or enter a new e-Visit to address those concerns.  You will get an e-mail in the next two days asking about your experience.  I hope that your  e-visit has been valuable and will speed your recovery. Thank you for using e-visits.  I have spent 5 minutes in review of e-visit questionnaire, review and updating patient chart, medical decision making and response to patient.   Margaretann Loveless, PA-C

## 2024-03-15 ENCOUNTER — Encounter: Admitting: Nurse Practitioner

## 2024-03-16 ENCOUNTER — Other Ambulatory Visit: Payer: Self-pay

## 2024-03-16 ENCOUNTER — Ambulatory Visit (INDEPENDENT_AMBULATORY_CARE_PROVIDER_SITE_OTHER): Admitting: Adult Health

## 2024-03-16 ENCOUNTER — Encounter (INDEPENDENT_AMBULATORY_CARE_PROVIDER_SITE_OTHER): Payer: Self-pay | Admitting: Adult Health

## 2024-03-16 VITALS — BP 103/68 | HR 65 | Temp 98.9°F | Ht 66.0 in | Wt 182.0 lb

## 2024-03-16 DIAGNOSIS — E559 Vitamin D deficiency, unspecified: Secondary | ICD-10-CM | POA: Diagnosis not present

## 2024-03-16 DIAGNOSIS — F3289 Other specified depressive episodes: Secondary | ICD-10-CM

## 2024-03-16 DIAGNOSIS — Z0000 Encounter for general adult medical examination without abnormal findings: Secondary | ICD-10-CM

## 2024-03-16 DIAGNOSIS — E88819 Insulin resistance, unspecified: Secondary | ICD-10-CM | POA: Diagnosis not present

## 2024-03-16 DIAGNOSIS — Z6829 Body mass index (BMI) 29.0-29.9, adult: Secondary | ICD-10-CM

## 2024-03-16 DIAGNOSIS — E66813 Obesity, class 3: Secondary | ICD-10-CM

## 2024-03-16 DIAGNOSIS — E669 Obesity, unspecified: Secondary | ICD-10-CM | POA: Diagnosis not present

## 2024-03-16 DIAGNOSIS — E7800 Pure hypercholesterolemia, unspecified: Secondary | ICD-10-CM

## 2024-03-16 MED ORDER — ESCITALOPRAM OXALATE 20 MG PO TABS
20.0000 mg | ORAL_TABLET | Freq: Every day | ORAL | 0 refills | Status: DC
  Filled 2024-03-16 – 2024-04-13 (×2): qty 90, 90d supply, fill #0

## 2024-03-16 MED ORDER — TIRZEPATIDE-WEIGHT MANAGEMENT 2.5 MG/0.5ML ~~LOC~~ SOLN: 2.5000 mg | SUBCUTANEOUS | 3 refills | Status: DC

## 2024-03-16 MED ORDER — ESCITALOPRAM OXALATE 20 MG PO TABS
20.0000 mg | ORAL_TABLET | Freq: Every day | ORAL | 0 refills | Status: DC
  Filled 2024-03-16: qty 90, 90d supply, fill #0

## 2024-03-16 NOTE — Progress Notes (Signed)
 WEIGHT SUMMARY AND BIOMETRICS  Vitals Temp: 98.9 F (37.2 C) BP: 103/68 Pulse Rate: 65 SpO2: 100 %   Anthropometric Measurements Height: 5' 6 (1.676 m) Weight: 182 lb (82.6 kg) BMI (Calculated): 29.39 Weight at Last Visit: 183lb Weight Lost Since Last Visit: 1lb Weight Gained Since Last Visit: 0lb Starting Weight: 281lb Total Weight Loss (lbs): 99 lb (44.9 kg)   Body Composition  Body Fat %: 38.8 % Fat Mass (lbs): 70.8 lbs Muscle Mass (lbs): 106 lbs Total Body Water  (lbs): 80.6 lbs Visceral Fat Rating : 9   Other Clinical Data Fasting: no Labs: Yes Today's Visit #: 59 Starting Date: 08/26/17    Chief Complaint:   OBESITY Wanda Peters is here to discuss her progress with her obesity treatment plan.  She is on the practicing portion control and making smarter food choices, such as increasing vegetables and decreasing simple carbohydrates and states she is following her eating plan approximately 90 % of the time.  She states she is exercising HITT/Cario 60 minutes 3 times per week.  Interim History:  She continues high intensity exercise 3 times weekly, led by a very supportive trainer.  She started loading dose Zepbound  2.5mg  on.about 09/03/2023 She has been spacing out injections every 10 days When she stretches out administration to every 14 days- she will experience cravings.  She is utilizing the [employer] Option to secure Rx Denies mass in neck, dysphagia, dyspepsia, persistent hoarseness, abdominal pain, or N/V/C   She is followed by [facility]  Unable to complete fasting labs today, will complete at next f/u  Reviewed [employer] Results with pt: Muscle Mass: +0.4 lb Adipose Mass: -1.8 lbs  Subjective:   1. Elevated LDL cholesterol level Lipid Panel     Component Value Date/Time   CHOL 184 09/03/2023 0927   TRIG 76 09/03/2023 0927   HDL 66 09/03/2023 0927   CHOLHDL 2.8 09/03/2023 0927   CHOLHDL 3.4 09/30/2018 0852   VLDL 11  09/30/2018 0852   LDLCALC 104 (H) 09/03/2023 0927   LABVLDL 14 09/03/2023 0927    The 10-year ASCVD risk score (Arnett DK, et al., 2019) is: 0.6%   Values used to calculate the score:     Age: 48 years     Clincally relevant sex: Female     Is Non-Hispanic African American: No     Diabetic: No     Tobacco smoker: No     Systolic Blood Pressure: 103 mmHg     Is BP treated: No     HDL Cholesterol: 66 mg/dL     Total Cholesterol: 184 mg/dL   Lipid panel stable She denies CP with exertion  She is not on statin therpay 2. Insulin  resistance  Latest Reference Range & Units 09/03/23 09:27  Glucose 70 - 99 mg/dL 75  Hemoglobin J8R 4.8 - 5.6 % 5.1  Est. average glucose Bld gHb Est-mCnc mg/dL 899  INSULIN  2.6 - 24.9 uIU/mL 1.8 (L)  (L): Data is abnormally low  She started loading dose Zepbound  2.5mg  on.about 09/03/2023 She has been spacing out injections every 10 days When she stretches out administration to every 14 days- she will experience cravings.  She is utilizing the [employer] Option to secure Rx Denies mass in neck, dysphagia, dyspepsia, persistent hoarseness, abdominal pain, or N/V/C   3. Vitamin D  deficiency  Latest Reference Range & Units 09/03/23 09:27  Vitamin D , 25-Hydroxy 30.0 - 100.0 ng/mL 67.2   Vit D Level stable  4. Healthcare maintenance She is followed by [facility]  She started loading dose Zepbound  2.5mg  on.about 09/03/2023 She has been spacing out injections every 10 days When she stretches out administration to every 14 days- she will experience cravings.  She is utilizing the [employer] Option to secure Rx Denies mass in neck, dysphagia, dyspepsia, persistent hoarseness, abdominal pain, or N/V/C   5. Other Depression She reports stable mood, denies SI/HI  Assessment/Plan:   1. Elevated LDL cholesterol level (Primary) Continue healthy eating and regular exercise Check Labs at next OV  2. Insulin  resistance Refill  tirzepatide   (ZEPBOUND ) 2.5 MG/0.5ML injection vial Inject 2.5 mg into the skin once a week. Dispense: 2 mL, Refills: 3 ordered   3. Vitamin D  deficiency Check Labs at next OV  4. Healthcare maintenance Continue Portion Control/Smart Choices Continue regular high intensity   5. Other Depression Refill escitalopram  (LEXAPRO ) 20 MG tablet Take 1 tablet (20 mg total) by mouth daily. Dispense: 90 tablet, Refills: 0 of 0 remaining   6 Obesity: current BMI 29.4 Continue healthy eating and regular exercise Refill  tirzepatide  (ZEPBOUND ) 2.5 MG/0.5ML injection vial Inject 2.5 mg into the skin once a week. Dispense: 2 mL, Refills: 3 ordered   Wanda Peters is currently in the action stage of change. As such, her goal is to maintain weight for now. She has agreed to practicing portion control and making smarter food choices, such as increasing vegetables and decreasing simple carbohydrates.   Exercise goals: For substantial health benefits, adults should do at least 150 minutes (2 hours and 30 minutes) a week of moderate-intensity, or 75 minutes (1 hour and 15 minutes) a week of vigorous-intensity aerobic physical activity, or an equivalent combination of moderate- and vigorous-intensity aerobic activity. Aerobic activity should be performed in episodes of at least 10 minutes, and preferably, it should be spread throughout the week.  Behavioral modification strategies: increasing lean protein intake, decreasing simple carbohydrates, increasing vegetables, increasing water  intake, no skipping meals, meal planning and cooking strategies, keeping healthy foods in the home, ways to avoid boredom eating, and planning for success.  Wanda Peters has agreed to follow-up with our clinic in 12 weeks. She was informed of the importance of frequent follow-up visits to maximize her success with intensive lifestyle modifications for her multiple health conditions.   Check Fasting Labs at next OV  Objective:   Blood pressure  103/68, pulse 65, temperature 98.9 F (37.2 C), height 5' 6 (1.676 m), weight 182 lb (82.6 kg), SpO2 100%. Body mass index is 29.38 kg/m.  General: Cooperative, alert, well developed, in no acute distress. HEENT: Conjunctivae and lids unremarkable. Cardiovascular: Regular rhythm.  Lungs: Normal work of breathing. Neurologic: No focal deficits.   Lab Results  Component Value Date   CREATININE 0.79 09/03/2023   BUN 19 09/03/2023   NA 137 09/03/2023   K 4.3 09/03/2023   CL 100 09/03/2023   CO2 21 09/03/2023   Lab Results  Component Value Date   ALT 11 09/03/2023   AST 16 09/03/2023   ALKPHOS 58 09/03/2023   BILITOT 0.4 09/03/2023   Lab Results  Component Value Date   HGBA1C 5.1 09/03/2023   HGBA1C 5.4 08/18/2022   HGBA1C 5.4 04/09/2021   HGBA1C 5.2 04/03/2020   HGBA1C 5.1 05/23/2019   Lab Results  Component Value Date   INSULIN  1.8 (L) 09/03/2023   INSULIN  3.4 08/18/2022   INSULIN  2.5 (L) 04/09/2021   INSULIN  4.1 04/03/2020   INSULIN  7.6 05/23/2019  Lab Results  Component Value Date   TSH 4.03 07/14/2017   Lab Results  Component Value Date   CHOL 184 09/03/2023   HDL 66 09/03/2023   LDLCALC 104 (H) 09/03/2023   TRIG 76 09/03/2023   CHOLHDL 2.8 09/03/2023   Lab Results  Component Value Date   VD25OH 67.2 09/03/2023   VD25OH 57.2 08/18/2022   VD25OH 59.3 04/09/2021   Lab Results  Component Value Date   WBC 6.5 04/09/2021   HGB 13.2 04/09/2021   HCT 38.2 04/09/2021   MCV 95 04/09/2021   PLT 238 04/09/2021   Lab Results  Component Value Date   IRON 109 04/03/2020   TIBC 268 04/03/2020   FERRITIN 124 07/02/2021   Attestation Statements:   Reviewed by clinician on day of visit: allergies, medications, problem list, medical history, surgical history, family history, social history, and previous encounter notes.  I have reviewed the above documentation for accuracy and completeness, and I agree with the above. - Oluwademilade Mckiver d. Meriah Shands, NP-C

## 2024-03-21 ENCOUNTER — Ambulatory Visit: Admitting: Podiatry

## 2024-04-13 ENCOUNTER — Other Ambulatory Visit: Payer: Self-pay

## 2024-04-16 ENCOUNTER — Encounter (INDEPENDENT_AMBULATORY_CARE_PROVIDER_SITE_OTHER): Payer: Self-pay | Admitting: Adult Health

## 2024-04-18 ENCOUNTER — Encounter (INDEPENDENT_AMBULATORY_CARE_PROVIDER_SITE_OTHER): Payer: Self-pay | Admitting: Adult Health

## 2024-04-19 ENCOUNTER — Other Ambulatory Visit: Payer: Self-pay

## 2024-04-19 DIAGNOSIS — Z6828 Body mass index (BMI) 28.0-28.9, adult: Secondary | ICD-10-CM | POA: Diagnosis not present

## 2024-04-19 DIAGNOSIS — Z1231 Encounter for screening mammogram for malignant neoplasm of breast: Secondary | ICD-10-CM | POA: Diagnosis not present

## 2024-04-19 DIAGNOSIS — Z1151 Encounter for screening for human papillomavirus (HPV): Secondary | ICD-10-CM | POA: Diagnosis not present

## 2024-04-19 DIAGNOSIS — N83209 Unspecified ovarian cyst, unspecified side: Secondary | ICD-10-CM | POA: Diagnosis not present

## 2024-04-19 DIAGNOSIS — Z124 Encounter for screening for malignant neoplasm of cervix: Secondary | ICD-10-CM | POA: Diagnosis not present

## 2024-04-19 DIAGNOSIS — Z01419 Encounter for gynecological examination (general) (routine) without abnormal findings: Secondary | ICD-10-CM | POA: Diagnosis not present

## 2024-04-19 MED ORDER — LO LOESTRIN FE 1 MG-10 MCG / 10 MCG PO TABS
1.0000 | ORAL_TABLET | Freq: Every day | ORAL | 3 refills | Status: AC
Start: 2024-04-19 — End: ?
  Filled 2024-04-19 – 2024-04-28 (×2): qty 84, 84d supply, fill #0

## 2024-04-28 ENCOUNTER — Other Ambulatory Visit: Payer: Self-pay

## 2024-05-17 ENCOUNTER — Encounter (HOSPITAL_COMMUNITY): Payer: Self-pay | Admitting: *Deleted

## 2024-05-31 ENCOUNTER — Encounter: Payer: Self-pay | Admitting: Nurse Practitioner

## 2024-05-31 ENCOUNTER — Other Ambulatory Visit: Payer: Self-pay

## 2024-05-31 ENCOUNTER — Ambulatory Visit: Admitting: Nurse Practitioner

## 2024-05-31 VITALS — BP 118/76 | HR 62 | Temp 98.1°F | Ht 66.0 in | Wt 179.2 lb

## 2024-05-31 DIAGNOSIS — F32A Depression, unspecified: Secondary | ICD-10-CM | POA: Diagnosis not present

## 2024-05-31 DIAGNOSIS — F419 Anxiety disorder, unspecified: Secondary | ICD-10-CM | POA: Diagnosis not present

## 2024-05-31 DIAGNOSIS — Z0001 Encounter for general adult medical examination with abnormal findings: Secondary | ICD-10-CM | POA: Diagnosis not present

## 2024-05-31 DIAGNOSIS — L989 Disorder of the skin and subcutaneous tissue, unspecified: Secondary | ICD-10-CM

## 2024-05-31 MED ORDER — HYDROXYZINE PAMOATE 25 MG PO CAPS
25.0000 mg | ORAL_CAPSULE | Freq: Every evening | ORAL | 5 refills | Status: AC | PRN
  Filled 2024-05-31: qty 30, 15d supply, fill #0

## 2024-05-31 NOTE — Assessment & Plan Note (Addendum)
 Chronic depression and anxiety are managed with Lexapro  20 mg. She reports worsening insomnia with nocturnal awakenings. Hydroxyzine  is prescribed as needed for anxiety and insomnia, 1-2 tablets at bedtime. Continue lexapro  20 mg daily. Counseled patient on common side effects. Encouraged to contact if worsening symptoms, unusual behavior changes or suicidal thoughts occur.

## 2024-05-31 NOTE — Assessment & Plan Note (Signed)
 A scabbed skin lesion has been present for four weeks, initially resembling a freckle then scabbing over and falling off. Apply a topical antibiotic ointment such as Neosporin or triple antibiotic to the lesion. If there is no improvement or if it worsens, consider a referral to dermatology.

## 2024-05-31 NOTE — Assessment & Plan Note (Signed)
 Physical exam complete. Pap smear and mammogram are up to date, follows with Ob-Gyn. Cologuard negative last year. The shingles vaccine is recommended but deferred. Flu and tetanus vaccines are up to date. Declines additional COVID vaccines. Labs are scheduled for January. She continues regular exercise and a low-carb, high-protein diet. Schedule the shingles vaccine after the holidays, either at a pharmacy or during a nurse visit. Continue with scheduled labs in January and maintain the current exercise and dietary regimen. Continue routine dental and eye exams. Return to care in one year, sooner as needed.

## 2024-05-31 NOTE — Progress Notes (Signed)
 " Leron Glance, NP-C Phone: [phone]  Wanda Peters is a 48 y.o. female who presents today for annual exam.   Discussed the use of AI scribe software for clinical note transcription with the patient, who gave verbal consent to proceed.  History of Present Illness   Wanda Peters is a 48 year old female who presents for an annual physical exam.  She has no new problems or concerns since her last visit over a year ago. She continues to follow up with [facility] and is scheduled for labs in January. She had a Pap smear in November following a previous abnormal result and colposcopy. Her mammogram is up to date, and she had a negative Cologuard test for colon cancer screening last year.  She has a family history of breast cancer in her mother but reports negative genetic testing results. There are no changes in her family history regarding ovarian or colon cancers.  She is on continuous birth control and does not have periods. She received a flu shot in October and is up to date on her tetanus booster and COVID vaccinations.  She does not smoke, consume alcohol, or use drugs. She maintains regular dental and eye check-ups. Her diet is low carb, high protein, and she drinks 80 to 100 ounces of water  daily. She exercises three days a week with a systems analyst.  She has been taking Lexapro  for three to five years for anxiety and depression. Recently, she has been waking up in the middle of the night, wide awake, for the past six months. She inquired about increasing her Lexapro  dosage but was informed that 20 mg is the maximum dose.  No chest pain, shortness of breath, gastrointestinal issues, urinary symptoms, headaches, dizziness, or trouble swallowing. She reports occasional painful sex but no other gynecological symptoms. She has no joint pains, skin changes, or rashes except for a spot that appeared four weeks ago, which scabbed over and is not itchy.       Tobacco Use History[1]  Medications Ordered Prior to Encounter[2]   ROS see history of present illness  Objective  Physical Exam Vitals:   05/31/24 0936  BP: 118/76  Pulse: 62  Temp: 98.1 F (36.7 C)  SpO2: 99%    BP Readings from Last 3 Encounters:  05/31/24 118/76  03/16/24 103/68  12/23/23 96/62   Wt Readings from Last 3 Encounters:  05/31/24 179 lb 3.2 oz (81.3 kg)  03/16/24 182 lb (82.6 kg)  12/23/23 183 lb (83 kg)    Physical Exam Constitutional:      General: She is not in acute distress.    Appearance: Normal appearance.  HENT:     Head: Normocephalic.     Right Ear: Tympanic membrane normal.     Left Ear: Tympanic membrane normal.     Nose: Nose normal.     Mouth/Throat:     Mouth: Mucous membranes are moist.     Pharynx: Oropharynx is clear.  Eyes:     Conjunctiva/sclera: Conjunctivae normal.     Pupils: Pupils are equal, round, and reactive to light.  Neck:     Thyroid : No thyromegaly.  Cardiovascular:     Rate and Rhythm: Normal rate and regular rhythm.     Heart sounds: Normal heart sounds.  Pulmonary:     Effort: Pulmonary effort is normal.     Breath sounds: Normal breath sounds.  Abdominal:     General: Abdomen is flat. Bowel sounds are  normal.     Palpations: Abdomen is soft. There is no mass.     Tenderness: There is no abdominal tenderness.  Musculoskeletal:        General: Normal range of motion.  Lymphadenopathy:     Cervical: No cervical adenopathy.  Skin:    General: Skin is warm and dry.     Findings: No rash.  Neurological:     General: No focal deficit present.     Mental Status: She is alert.  Psychiatric:        Mood and Affect: Mood normal.        Behavior: Behavior normal.      Assessment/Plan: Please see individual problem list.  Encounter for routine adult health examination with abnormal findings Assessment & Plan: Physical exam complete. Pap smear and mammogram are up to date, follows with Ob-Gyn.  Cologuard negative last year. The shingles vaccine is recommended but deferred. Flu and tetanus vaccines are up to date. Declines additional COVID vaccines. Labs are scheduled for January. She continues regular exercise and a low-carb, high-protein diet. Schedule the shingles vaccine after the holidays, either at a pharmacy or during a nurse visit. Continue with scheduled labs in January and maintain the current exercise and dietary regimen. Continue routine dental and eye exams. Return to care in one year, sooner as needed.    Anxiety and depression Assessment & Plan: Chronic depression and anxiety are managed with Lexapro  20 mg. She reports worsening insomnia with nocturnal awakenings. Hydroxyzine  is prescribed as needed for anxiety and insomnia, 1-2 tablets at bedtime. Continue lexapro  20 mg daily. Counseled patient on common side effects. Encouraged to contact if worsening symptoms, unusual behavior changes or suicidal thoughts occur.   Orders: -     hydrOXYzine  Pamoate; Take 1-2 capsules (25-50 mg total) by mouth at bedtime as needed for anxiety.  Dispense: 30 capsule; Refill: 5  Skin lesion Assessment & Plan: A scabbed skin lesion has been present for four weeks, initially resembling a freckle then scabbing over and falling off. Apply a topical antibiotic ointment such as Neosporin or triple antibiotic to the lesion. If there is no improvement or if it worsens, consider a referral to dermatology.     Return in about 1 year (around 05/31/2025) for Annual Exam, sooner as needed.   Leron Glance, NP-C [facility] [place]    [1]  Social History Tobacco Use  Smoking Status Never  Smokeless Tobacco Never  [2]  Current Outpatient Medications on File Prior to Visit  Medication Sig Dispense Refill   CALCIUM PO Take 1 tablet by mouth daily.     escitalopram  (LEXAPRO ) 20 MG tablet Take 1 tablet (20 mg total) by mouth daily. 90 tablet 0   Multiple Vitamin (MULTIVITAMIN  ADULT PO) Take by mouth daily.     Norethindrone-Ethinyl Estradiol -Fe Biphas (LO LOESTRIN FE ) 1 MG-10 MCG / 10 MCG tablet Take 1 tablet by mouth daily. 84 tablet 3   tirzepatide  (ZEPBOUND ) 2.5 MG/0.5ML injection vial Inject 2.5 mg into the skin once a week. 2 mL 3   No current facility-administered medications on file prior to visit.   "

## 2024-06-22 ENCOUNTER — Encounter (INDEPENDENT_AMBULATORY_CARE_PROVIDER_SITE_OTHER): Payer: Self-pay | Admitting: Adult Health

## 2024-06-22 ENCOUNTER — Ambulatory Visit (INDEPENDENT_AMBULATORY_CARE_PROVIDER_SITE_OTHER): Payer: Self-pay | Admitting: Adult Health

## 2024-06-22 ENCOUNTER — Other Ambulatory Visit: Payer: Self-pay

## 2024-06-22 VITALS — BP 100/66 | HR 66 | Temp 97.8°F | Ht 66.0 in | Wt 176.0 lb

## 2024-06-22 DIAGNOSIS — E669 Obesity, unspecified: Secondary | ICD-10-CM | POA: Diagnosis not present

## 2024-06-22 DIAGNOSIS — E88819 Insulin resistance, unspecified: Secondary | ICD-10-CM | POA: Diagnosis not present

## 2024-06-22 DIAGNOSIS — F3289 Other specified depressive episodes: Secondary | ICD-10-CM

## 2024-06-22 DIAGNOSIS — E7800 Pure hypercholesterolemia, unspecified: Secondary | ICD-10-CM

## 2024-06-22 DIAGNOSIS — Z6829 Body mass index (BMI) 29.0-29.9, adult: Secondary | ICD-10-CM

## 2024-06-22 DIAGNOSIS — E559 Vitamin D deficiency, unspecified: Secondary | ICD-10-CM | POA: Diagnosis not present

## 2024-06-22 DIAGNOSIS — Z6841 Body Mass Index (BMI) 40.0 and over, adult: Secondary | ICD-10-CM

## 2024-06-22 MED ORDER — ESCITALOPRAM OXALATE 20 MG PO TABS
20.0000 mg | ORAL_TABLET | Freq: Every day | ORAL | 0 refills | Status: AC
  Filled 2024-06-22: qty 90, 90d supply, fill #0

## 2024-06-22 MED ORDER — TIRZEPATIDE-WEIGHT MANAGEMENT 5 MG/0.5ML ~~LOC~~ SOLN: 5.0000 mg | SUBCUTANEOUS | 1 refills | Status: AC

## 2024-06-22 NOTE — Progress Notes (Signed)
 "    WEIGHT SUMMARY AND BIOMETRICS  Vitals Temp: 97.8 F (36.6 C) BP: 100/66 Pulse Rate: 66 SpO2: 100 %   Anthropometric Measurements Height: 5' 6 (1.676 m) Weight: 176 lb (79.8 kg) BMI (Calculated): 28.42 Weight at Last Visit: 182lb Weight Lost Since Last Visit: 6lb Weight Gained Since Last Visit: 0lb Starting Weight: 281lb Total Weight Loss (lbs): 105 lb (47.6 kg)   Body Composition  Body Fat %: 37.5 % Fat Mass (lbs): 66.2 lbs Muscle Mass (lbs): 104.6 lbs Total Body Water  (lbs): 77 lbs Visceral Fat Rating : 8   Other Clinical Data Fasting: yes Labs: No Today's Visit #: 55 Starting Date: 08/26/17    Chief Complaint:   OBESITY Wanda Peters is here to discuss her progress with her obesity treatment plan.  She is on the practicing portion control and making smarter food choices, such as increasing vegetables and decreasing simple carbohydrates and states she is following her eating plan approximately 85-90 % of the time.  She states she is exercising HITT/GYM 60 minutes 3 times per week.  Interim History:  She started loading dose Zepbound  2.5mg  on.about 09/03/2023 She has been spacing out injections every 10 days When she stretches out administration to every 14 days- she will experience cravings.   She is utilizing the [employer] Option to secure Rx Discussed refilling at 5mg  vials, use 1/2 vial = 2.5mg  dose- cost savings   She is followed by [facility]  Reviewed Bioimpedance Results with pt: Muscle Mass:-1.4 lbs Adipose Mass:-4.6 lbs Visceral Adipose Rating: 8- best reading ever  Subjective:   1. Vitamin D  deficiency Stable energy levels  2. Insulin  resistance She started loading dose Zepbound  2.5mg  on.about 09/03/2023 She has been spacing out injections every 10 days When she stretches out administration to every 14 days- she will experience cravings.   She is utilizing the [employer] Option to secure Rx Discussed refilling at 5mg  vials,  use 1/2 vial = 2.5mg  dose- cost savings  Denies mass in neck, dysphagia, dyspepsia, persistent hoarseness, abdominal pain, or N/V/C   3. Elevated LDL cholesterol level She continues to exercise at high intensity without CP or dyspnea She denies family hx of CAD or diabetes  4. Other depression Stable mood Denies SI/HI She is on daily Lexapro  20 mg  Assessment/Plan:   1. Vitamin D  deficiency (Primary) Check Labs - VITAMIN D  25 Hydroxy (Vit-D Deficiency, Fractures)  2. Insulin  resistance Check Labs - CBC with Differential/Platelet - Hemoglobin A1c - Insulin , random  3. Elevated LDL cholesterol level Check Labs - Comprehensive metabolic panel with GFR - Lipid panel  4. Other depression Refill - escitalopram  (LEXAPRO ) 20 MG tablet; Take 1 tablet (20 mg total) by mouth daily.  Dispense: 90 tablet; Refill: 0  5. Obesity: current BMI 29.3 Refill and INCREASE  tirzepatide  5 MG/0.5ML injection vial Inject 5 mg into the skin once a week. Dispense: 2 mL, Refills: 1 ordered   1/2 vial  = 2.5mg   Patient was counseled on the importance of maintaining healthy lifestyle habits, including balanced nutrition, regular physical activity, and behavioral modifications, while taking antiobesity medication.   Patient verbalized understanding that medication is an adjunct to, not a replacement for, lifestyle changes and that the long-term success and weight maintenance depend on continued adherence to these strategies.   Damiana is currently in the action stage of change. As such, her goal is to maintain weight for now. She has agreed to practicing portion control and making smarter food  choices, such as increasing vegetables and decreasing simple carbohydrates.   Exercise goals: For additional and more extensive health benefits, adults should increase their aerobic physical activity to 300 minutes (5 hours) a week of moderate-intensity, or 150 minutes a week of vigorous-intensity aerobic  physical activity, or an equivalent combination of moderate- and vigorous-intensity activity. Additional health benefits are gained by engaging in physical activity beyond this amount.   Behavioral modification strategies: increasing lean protein intake, decreasing simple carbohydrates, increasing vegetables, increasing water  intake, no skipping meals, meal planning and cooking strategies, keeping healthy foods in the home, ways to avoid boredom eating, and planning for success.  Myangel has agreed to follow-up with our clinic in 12 weeks. She was informed of the importance of frequent follow-up visits to maximize her success with intensive lifestyle modifications for her multiple health conditions.   Emma was informed we would discuss her lab results at her next visit unless there is a critical issue that needs to be addressed sooner. Naomii agreed to keep her next visit at the agreed upon time to discuss these results.  Objective:   Blood pressure 100/66, pulse 66, temperature 97.8 F (36.6 C), height 5' 6 (1.676 m), weight 176 lb (79.8 kg), SpO2 100%. Body mass index is 28.41 kg/m.  General: Cooperative, alert, well developed, in no acute distress. HEENT: Conjunctivae and lids unremarkable. Cardiovascular: Regular rhythm.  Lungs: Normal work of breathing. Neurologic: No focal deficits.   Lab Results  Component Value Date   CREATININE 0.79 09/03/2023   BUN 19 09/03/2023   NA 137 09/03/2023   K 4.3 09/03/2023   CL 100 09/03/2023   CO2 21 09/03/2023   Lab Results  Component Value Date   ALT 11 09/03/2023   AST 16 09/03/2023   ALKPHOS 58 09/03/2023   BILITOT 0.4 09/03/2023   Lab Results  Component Value Date   HGBA1C 5.1 09/03/2023   HGBA1C 5.4 08/18/2022   HGBA1C 5.4 04/09/2021   HGBA1C 5.2 04/03/2020   HGBA1C 5.1 05/23/2019   Lab Results  Component Value Date   INSULIN  1.8 (L) 09/03/2023   INSULIN  3.4 08/18/2022   INSULIN  2.5 (L) 04/09/2021   INSULIN  4.1  04/03/2020   INSULIN  7.6 05/23/2019   Lab Results  Component Value Date   TSH 4.03 07/14/2017   Lab Results  Component Value Date   CHOL 184 09/03/2023   HDL 66 09/03/2023   LDLCALC 104 (H) 09/03/2023   TRIG 76 09/03/2023   CHOLHDL 2.8 09/03/2023   Lab Results  Component Value Date   VD25OH 67.2 09/03/2023   VD25OH 57.2 08/18/2022   VD25OH 59.3 04/09/2021   Lab Results  Component Value Date   WBC 6.5 04/09/2021   HGB 13.2 04/09/2021   HCT 38.2 04/09/2021   MCV 95 04/09/2021   PLT 238 04/09/2021   Lab Results  Component Value Date   IRON 109 04/03/2020   TIBC 268 04/03/2020   FERRITIN 124 07/02/2021    Attestation Statements:   Reviewed by clinician on day of visit: allergies, medications, problem list, medical history, surgical history, family history, social history, and previous encounter notes.  I have reviewed the above documentation for accuracy and completeness, and I agree with the above. -  Amnah Breuer d. Chatara Lucente, NP-C "

## 2024-06-23 ENCOUNTER — Encounter (INDEPENDENT_AMBULATORY_CARE_PROVIDER_SITE_OTHER): Payer: Self-pay | Admitting: Physician Assistant

## 2024-06-23 LAB — CBC WITH DIFFERENTIAL/PLATELET
Basophils Absolute: 0 x10E3/uL (ref 0.0–0.2)
Basos: 1 %
EOS (ABSOLUTE): 0.1 x10E3/uL (ref 0.0–0.4)
Eos: 2 %
Hematocrit: 38.8 % (ref 34.0–46.6)
Hemoglobin: 12.9 g/dL (ref 11.1–15.9)
Immature Grans (Abs): 0 x10E3/uL (ref 0.0–0.1)
Immature Granulocytes: 0 %
Lymphocytes Absolute: 1.6 x10E3/uL (ref 0.7–3.1)
Lymphs: 33 %
MCH: 33 pg (ref 26.6–33.0)
MCHC: 33.2 g/dL (ref 31.5–35.7)
MCV: 99 fL — ABNORMAL HIGH (ref 79–97)
Monocytes Absolute: 0.4 x10E3/uL (ref 0.1–0.9)
Monocytes: 8 %
Neutrophils Absolute: 2.7 x10E3/uL (ref 1.4–7.0)
Neutrophils: 56 %
Platelets: 246 x10E3/uL (ref 150–450)
RBC: 3.91 x10E6/uL (ref 3.77–5.28)
RDW: 11.9 % (ref 11.7–15.4)
WBC: 4.9 x10E3/uL (ref 3.4–10.8)

## 2024-06-23 LAB — COMPREHENSIVE METABOLIC PANEL WITH GFR
ALT: 16 IU/L (ref 0–32)
AST: 20 IU/L (ref 0–40)
Albumin: 4.2 g/dL (ref 3.9–4.9)
Alkaline Phosphatase: 55 IU/L (ref 41–116)
BUN/Creatinine Ratio: 24 — ABNORMAL HIGH (ref 9–23)
BUN: 20 mg/dL (ref 6–24)
Bilirubin Total: 0.5 mg/dL (ref 0.0–1.2)
CO2: 21 mmol/L (ref 20–29)
Calcium: 9 mg/dL (ref 8.7–10.2)
Chloride: 103 mmol/L (ref 96–106)
Creatinine, Ser: 0.83 mg/dL (ref 0.57–1.00)
Globulin, Total: 2.5 g/dL (ref 1.5–4.5)
Glucose: 74 mg/dL (ref 70–99)
Potassium: 4.6 mmol/L (ref 3.5–5.2)
Sodium: 138 mmol/L (ref 134–144)
Total Protein: 6.7 g/dL (ref 6.0–8.5)
eGFR: 86 mL/min/1.73

## 2024-06-23 LAB — LIPID PANEL
Chol/HDL Ratio: 2.7 ratio (ref 0.0–4.4)
Cholesterol, Total: 175 mg/dL (ref 100–199)
HDL: 65 mg/dL
LDL Chol Calc (NIH): 95 mg/dL (ref 0–99)
Triglycerides: 82 mg/dL (ref 0–149)
VLDL Cholesterol Cal: 15 mg/dL (ref 5–40)

## 2024-06-23 LAB — HEMOGLOBIN A1C
Est. average glucose Bld gHb Est-mCnc: 97 mg/dL
Hgb A1c MFr Bld: 5 % (ref 4.8–5.6)

## 2024-06-23 LAB — VITAMIN D 25 HYDROXY (VIT D DEFICIENCY, FRACTURES): Vit D, 25-Hydroxy: 59.6 ng/mL (ref 30.0–100.0)

## 2024-06-23 LAB — INSULIN, RANDOM: INSULIN: 3.8 u[IU]/mL (ref 2.6–24.9)

## 2024-07-01 ENCOUNTER — Ambulatory Visit

## 2024-09-21 ENCOUNTER — Ambulatory Visit (INDEPENDENT_AMBULATORY_CARE_PROVIDER_SITE_OTHER): Admitting: Adult Health

## 2025-06-06 ENCOUNTER — Encounter: Admitting: Nurse Practitioner
